# Patient Record
Sex: Female | Born: 1955
Health system: Southern US, Community
[De-identification: ages and names within clinical notes are randomized; demographics above are authoritative.]

## PROBLEM LIST (undated history)

## (undated) DIAGNOSIS — E7800 Pure hypercholesterolemia, unspecified: Secondary | ICD-10-CM

## (undated) DIAGNOSIS — E8801 Alpha-1-antitrypsin deficiency: Secondary | ICD-10-CM

## (undated) DIAGNOSIS — R011 Cardiac murmur, unspecified: Secondary | ICD-10-CM

## (undated) DIAGNOSIS — R911 Solitary pulmonary nodule: Secondary | ICD-10-CM

## (undated) DIAGNOSIS — I10 Essential (primary) hypertension: Secondary | ICD-10-CM

## (undated) DIAGNOSIS — F419 Anxiety disorder, unspecified: Secondary | ICD-10-CM

## (undated) DIAGNOSIS — E559 Vitamin D deficiency, unspecified: Secondary | ICD-10-CM

## (undated) HISTORY — DX: Cardiac murmur, unspecified: R01.1

## (undated) HISTORY — DX: Essential (primary) hypertension: I10

## (undated) HISTORY — DX: Solitary pulmonary nodule: R91.1

## (undated) HISTORY — PX: ABDOMINAL HYSTERECTOMY: SHX81

## (undated) HISTORY — PX: BREAST BIOPSY: SHX20

## (undated) HISTORY — DX: Alpha-1-antitrypsin deficiency: E88.01

## (undated) HISTORY — DX: Vitamin D deficiency, unspecified: E55.9

## (undated) HISTORY — DX: Pure hypercholesterolemia, unspecified: E78.00

## (undated) HISTORY — PX: BREAST EXCISIONAL BIOPSY: SUR124

## (undated) HISTORY — DX: Anxiety disorder, unspecified: F41.9

---

## 2016-07-29 ENCOUNTER — Other Ambulatory Visit: Payer: Self-pay | Admitting: Internal Medicine

## 2016-07-29 DIAGNOSIS — Z1231 Encounter for screening mammogram for malignant neoplasm of breast: Secondary | ICD-10-CM

## 2016-08-13 ENCOUNTER — Other Ambulatory Visit: Payer: Self-pay | Admitting: Internal Medicine

## 2016-08-13 ENCOUNTER — Ambulatory Visit
Admission: RE | Admit: 2016-08-13 | Discharge: 2016-08-13 | Disposition: A | Discharge status: Home / Self Care | Payer: BLUE CROSS/BLUE SHIELD | Source: Ambulatory Visit | Attending: Internal Medicine | Admitting: Internal Medicine

## 2016-08-13 DIAGNOSIS — Z87898 Personal history of other specified conditions: Secondary | ICD-10-CM

## 2016-08-13 DIAGNOSIS — Z1231 Encounter for screening mammogram for malignant neoplasm of breast: Secondary | ICD-10-CM

## 2016-08-16 ENCOUNTER — Ambulatory Visit
Admission: RE | Admit: 2016-08-16 | Discharge: 2016-08-16 | Disposition: A | Discharge status: Home / Self Care | Payer: BLUE CROSS/BLUE SHIELD | Source: Ambulatory Visit | Attending: Internal Medicine | Admitting: Internal Medicine

## 2016-08-16 ENCOUNTER — Other Ambulatory Visit: Payer: Self-pay

## 2016-08-16 DIAGNOSIS — Z87898 Personal history of other specified conditions: Secondary | ICD-10-CM

## 2016-08-16 MED ORDER — IOPAMIDOL (ISOVUE-300) INJECTION 61%
75.0000 mL | Freq: Once | INTRAVENOUS | Status: AC | PRN
  Administered 2016-08-16: 75 mL via INTRAVENOUS

## 2016-10-05 ENCOUNTER — Ambulatory Visit
Admission: RE | Admit: 2016-10-05 | Discharge: 2016-10-05 | Disposition: A | Discharge status: Home / Self Care | Payer: BLUE CROSS/BLUE SHIELD | Source: Ambulatory Visit | Attending: Internal Medicine | Admitting: Internal Medicine

## 2016-10-05 ENCOUNTER — Other Ambulatory Visit: Payer: Self-pay | Admitting: Internal Medicine

## 2016-10-05 DIAGNOSIS — Z0000 Encounter for general adult medical examination without abnormal findings: Secondary | ICD-10-CM

## 2016-10-08 ENCOUNTER — Institutional Professional Consult (permissible substitution): Payer: BLUE CROSS/BLUE SHIELD | Admitting: Internal Medicine

## 2016-11-12 ENCOUNTER — Encounter: Payer: Self-pay | Admitting: Pulmonary Disease

## 2016-11-12 ENCOUNTER — Ambulatory Visit (INDEPENDENT_AMBULATORY_CARE_PROVIDER_SITE_OTHER): Payer: BLUE CROSS/BLUE SHIELD | Admitting: Pulmonary Disease

## 2016-11-12 ENCOUNTER — Telehealth: Payer: Self-pay

## 2016-11-12 VITALS — BP 124/74 | HR 65 | Ht 62.0 in | Wt 130.2 lb

## 2016-11-12 DIAGNOSIS — R0602 Shortness of breath: Secondary | ICD-10-CM | POA: Diagnosis not present

## 2016-11-12 MED ORDER — ALBUTEROL SULFATE HFA 108 (90 BASE) MCG/ACT IN AERS: 2.0000 | INHALATION_SPRAY | Freq: Four times a day (QID) | RESPIRATORY_TRACT | 2 refills | Status: DC | PRN

## 2016-11-12 MED ORDER — UMECLIDINIUM-VILANTEROL 62.5-25 MCG/INH IN AEPB: 1.0000 | INHALATION_SPRAY | Freq: Every day | RESPIRATORY_TRACT | 0 refills | Status: AC

## 2016-11-12 NOTE — Patient Instructions (Signed)
We will start you on anoro and an albuterol rescue inhaler We will schedule you for PFTs and try to obtain old tests from your pulmonologist Return to clinic in 3 months.

## 2016-11-12 NOTE — Telephone Encounter (Signed)
Medical release has faxed to Dr. Cheryl Flash office requesting PFT results and last OV notes. Received successful fax confirmation. Will await records.

## 2016-11-12 NOTE — Progress Notes (Addendum)
Jeanette Barnes    [id number]    1955/09/24  Primary Care Physician:Barnes, Jeanette Challenger, MD  Referring Physician: Kendrick Ranch, MD [address] [place], [place] [address]  Chief complaint: Consult for evaluation of alpha-1 antitrypsin deficiency.   HPI: Jeanette Barnes is a 61 year old with history of alpha-1 antitrypsin deficiency. This was diagnosed in 2014 at [facility] by Dr. Irineo Barnes, Pulmonary with SS phenotype. She is also being followed for subcentimeter pulmonary nodules. Since she is a never smoker just told she does not need a follow-up CT scan. Her father had emphysema but is also heavy smoker. She's had an evaluation at GI in [place] with a normal ultrasound of the liver  In office today she complains of mild dyspnea with activity, nonproductive cough. She has atypical chest pain and has been worked up with a cardiac stress test last year which was normal.  Pets: None Occupation: Secondary school teacher for [employer] Exposures: No known exposures at work or at home Smoking history: No smoking history  Outpatient Encounter Prescriptions as of 11/12/2016  Medication Sig  . ALPRAZolam (XANAX) 0.5 MG tablet   . cholecalciferol (VITAMIN D) 400 units TABS tablet Take 400 Units by mouth.  . dicyclomine (BENTYL) 20 MG tablet   . hydrochlorothiazide (HYDRODIURIL) 25 MG tablet    No facility-administered encounter medications on file as of 11/12/2016.     Allergies as of 11/12/2016  . (Not on File)    Past Medical History:  Diagnosis Date  . AAT (alpha-1-antitrypsin) deficiency (HCC)   . Anxiety   . Hypercholesteremia   . Hypertension   . Pulmonary nodule   . Vitamin D deficiency     Past Surgical History:  Procedure Laterality Date  . ABDOMINAL HYSTERECTOMY    . BREAST BIOPSY    . BREAST EXCISIONAL BIOPSY Bilateral 1990's   2 Left, 1 Right, all benign    Family History  Problem Relation Age of Onset  . Emphysema Father   .  Hypertension Brother     Social History   Social History  . Marital status: Single    Spouse name: N/A  . Number of children: N/A  . Years of education: N/A   Occupational History  . Not on file.   Social History Main Topics  . Smoking status: Never Smoker  . Smokeless tobacco: Never Used  . Alcohol use No  . Drug use: No  . Sexual activity: Not on file   Other Topics Concern  . Not on file   Social History Narrative  . No narrative on file    Review of systems: Review of Systems  Constitutional: Negative for fever and chills.  HENT: Negative.   Eyes: Negative for blurred vision.  Respiratory: as per HPI  Cardiovascular: Negative for chest pain and palpitations.  Gastrointestinal: Negative for vomiting, diarrhea, blood per rectum. Genitourinary: Negative for dysuria, urgency, frequency and hematuria.  Musculoskeletal: Negative for myalgias, back pain and joint pain.  Skin: Negative for itching and rash.  Neurological: Negative for dizziness, tremors, focal weakness, seizures and loss of consciousness.  Endo/Heme/Allergies: Negative for environmental allergies.  Psychiatric/Behavioral: Negative for depression, suicidal ideas and hallucinations.  All other systems reviewed and are negative.  Physical Exam: Blood pressure 124/74, pulse 65, height 5\' 2"  (1.575 m), weight 130 lb 3.2 oz (59.1 kg), SpO2 98 %. Gen:      No acute distress HEENT:  EOMI, sclera anicteric Neck:  No masses; no thyromegaly Lungs:    Clear to auscultation bilaterally; normal respiratory effort CV:         Regular rate and rhythm; no murmurs Abd:      + bowel sounds; soft, non-tender; no palpable masses, no distension Ext:    No edema; adequate peripheral perfusion Skin:      Warm and dry; no rash Neuro: alert and oriented x 3 Psych: normal mood and affect  Data Reviewed: Data from outside [facility] Alpha-1 antitrypsin 09/08/12-SS Phenotype Alpha-1 antitrypsin levels 08/01/15-76.  HRCT  7/1//15-No Emphysema or ILD. Small Fibrotic Streaks. 3 Small Nodules (Left Apex 3 Mm, Posterior Lingular 3 Mm, Right Lower Lobe 5 Mm. HRCT 11/19/14-Subpleural Groundglass Pulmonary Nodule in the Posterior Lingula [3 Mm] and a at Left Lung Base Adjacent to Hemidiaphragm [5 Mm], Medial Left Lower Lobe Scar, Calcified Left Apical Nodule  PFTs 07/05/12- no significant obstructive disease, lung volumes within normal limits, DLCO mildly reduced. FEV 2.91 [104% predicted], ratio of 76.  CT scan at [facility] 08/16/16 4 mm subpleural nodule in the left lower lobe. Mild subpleural nodular thickening in the right middle lobe. I have reviewed the images personally.  Assessment:  Alpha-1 antitrypsin deficiency, SS phenotype. The last A1AT levels are low normal but there was no significant obstruction on PFTs in 2014. We will repeat PFTs to reassess lung function. There is no indication at present for A1AT replacement.   I'll start her on Anoro and an albuterol rescue inhaler for symptoms of dyspnea  Subcentimeter pulmonary nodule These appear benign on my review of CT scan. She has pulmonary nodules dating back to 2015. She is a nonsmoker with no known risk factors and does not need regular follow-up CT scan.  Plan/Recommendations: - Start anoro and albuterol PRN - Order PFTs  Jeanette Greathouse MD [facility] Pager [phone] 11/12/2016, 4:23 PM  CC: Jeanette Barnes, *

## 2017-01-31 ENCOUNTER — Ambulatory Visit: Payer: BLUE CROSS/BLUE SHIELD | Admitting: Pulmonary Disease

## 2017-04-14 ENCOUNTER — Emergency Department (HOSPITAL_BASED_OUTPATIENT_CLINIC_OR_DEPARTMENT_OTHER): Payer: BLUE CROSS/BLUE SHIELD

## 2017-04-14 ENCOUNTER — Encounter (HOSPITAL_BASED_OUTPATIENT_CLINIC_OR_DEPARTMENT_OTHER): Payer: Self-pay | Admitting: Emergency Medicine

## 2017-04-14 ENCOUNTER — Other Ambulatory Visit: Payer: Self-pay

## 2017-04-14 ENCOUNTER — Emergency Department (HOSPITAL_BASED_OUTPATIENT_CLINIC_OR_DEPARTMENT_OTHER)
Admission: EM | Admit: 2017-04-14 | Discharge: 2017-04-14 | Disposition: A | Discharge status: Home / Self Care | Payer: BLUE CROSS/BLUE SHIELD | Attending: Emergency Medicine | Admitting: Emergency Medicine

## 2017-04-14 DIAGNOSIS — R42 Dizziness and giddiness: Secondary | ICD-10-CM | POA: Insufficient documentation

## 2017-04-14 DIAGNOSIS — Z79899 Other long term (current) drug therapy: Secondary | ICD-10-CM | POA: Diagnosis not present

## 2017-04-14 DIAGNOSIS — R079 Chest pain, unspecified: Secondary | ICD-10-CM | POA: Diagnosis present

## 2017-04-14 DIAGNOSIS — R0789 Other chest pain: Secondary | ICD-10-CM | POA: Insufficient documentation

## 2017-04-14 DIAGNOSIS — I10 Essential (primary) hypertension: Secondary | ICD-10-CM | POA: Insufficient documentation

## 2017-04-14 LAB — BASIC METABOLIC PANEL
Anion gap: 9 (ref 5–15)
BUN: 14 mg/dL (ref 6–20)
CALCIUM: 9.1 mg/dL (ref 8.9–10.3)
CO2: 27 mmol/L (ref 22–32)
Chloride: 104 mmol/L (ref 101–111)
Creatinine, Ser: 0.55 mg/dL (ref 0.44–1.00)
GFR calc non Af Amer: >60 mL/min (ref >60)
Glucose, Bld: 99 mg/dL (ref 65–99)
Potassium: 3.5 mmol/L (ref 3.5–5.1)
SODIUM: 140 mmol/L (ref 135–145)

## 2017-04-14 LAB — CBC
HCT: 36.2 % (ref 36.0–46.0)
Hemoglobin: 12.2 g/dL (ref 12.0–15.0)
MCH: 30.3 pg (ref 26.0–34.0)
MCHC: 33.7 g/dL (ref 30.0–36.0)
MCV: 90 fL (ref 78.0–100.0)
Platelets: 210 10*3/uL (ref 150–400)
RBC: 4.02 MIL/uL (ref 3.87–5.11)
RDW: 12.4 % (ref 11.5–15.5)
WBC: 4.6 10*3/uL (ref 4.0–10.5)

## 2017-04-14 LAB — TROPONIN I

## 2017-04-14 LAB — D-DIMER, QUANTITATIVE: D-Dimer, Quant: 0.43 ug/mL-FEU (ref 0.00–0.50)

## 2017-04-14 MED ORDER — SODIUM CHLORIDE 0.9 % IV BOLUS (SEPSIS)
500.0000 mL | Freq: Once | INTRAVENOUS | Status: AC
  Administered 2017-04-14: 500 mL via INTRAVENOUS

## 2017-04-14 NOTE — ED Triage Notes (Signed)
Pt states she has been having intermittent chest pain for two weeks, headache x 2 days, dizziness when standing to transfer to bed.  Pt describes pain as a pressure over her left breast.  Pt states she feels anxious when the pain occurs and feels a little sob.  No diaphoresis, some chills.  Pt states she has been cold a lot lately even with thermostat 72 degrees.

## 2017-04-14 NOTE — Discharge Instructions (Addendum)
You were seen in the emergency department for chest pain. Your blood work and imaging completed in the ED was negative for signs of a pulmonary embolism or heart attack.  Reasons to return to care would be if you have shortness of breath or chest pain that is not resolving, or if you are unable to stay hydrated by mouth.  Please schedule follow up to be seen by your regular doctor within the next 5-7 days.

## 2017-04-14 NOTE — ED Provider Notes (Signed)
[facility] EMERGENCY DEPARTMENT Provider Note   CSN: [id number] Arrival date & time: 04/14/17  0354   History   Chief Complaint Chief Complaint  Patient presents with  . Chest Pain  . Dizziness    HPI Jeanette Barnes is a 62 y.o. female with history of alpha-1-antitrypsin deficiency presents with substernal chest pressure of 8/10 severity that radiates to the left side of her chest. The pain was noted yesterday evening for 1-2 minutes while she was lying on sofa, and then again this morning for 1-2 moments also coming on at rest. She became anxious with the chest pain and felt short of breath but denies diaphoresis, nausea or vomiting. No recent fevers, cough or congestion. No recent wheezing, uses albuterol rarely. She has had previous episodes over last few weeks, but does not feel they have been brought on by activity and she endorses walking her dog daily. She has been feeling general fatigue recently. No recent travel.  Patient does endorse feeling light-headed this morning with chest pain, which she describes as feeling as though she may faint. She has nausea with the light headedness but has not vomited. She does endorse chronic nausea. She additionally is having a headache over her left eye and radiating to her jaw.  Patient reports that she saw a cardiologist back in 2017 had a stress test and cardiac echo which were normal per her report.     Past Medical History:  Diagnosis Date  . AAT (alpha-1-antitrypsin) deficiency (HCC)   . Anxiety   . Hypercholesteremia   . Hypertension   . Pulmonary nodule   . Vitamin D deficiency     There are no active problems to display for this patient.   OB History    No data available       Home Medications    Prior to Admission medications   Medication Sig Start Date End Date Taking? Authorizing Provider  albuterol (PROVENTIL HFA;VENTOLIN HFA) 108 (90 Base) MCG/ACT inhaler Inhale 2 puffs into the lungs every 6 (six)  hours as needed for wheezing or shortness of breath. 11/12/16   Chilton Greathouse, MD  ALPRAZolam Prudy Feeler) 0.5 MG tablet  08/13/16   [provider]  cholecalciferol (VITAMIN D) 400 units TABS tablet Take 400 Units by mouth.    [provider]  dicyclomine (BENTYL) 20 MG tablet  11/11/16   [provider]  hydrochlorothiazide (HYDRODIURIL) 25 MG tablet  10/12/16   [provider]    Family History Family History  Problem Relation Age of Onset  . Emphysema Father   . Hypertension Brother     Social History Social History   Tobacco Use  . Smoking status: Never Smoker  . Smokeless tobacco: Never Used  Substance Use Topics  . Alcohol use: No  . Drug use: No     Allergies   Patient has no known allergies.   Review of Systems Review of Systems  Constitutional: Positive for fatigue. Negative for chills, diaphoresis and fever.  HENT: Negative for congestion, ear pain, rhinorrhea and sneezing.   Respiratory: Negative for cough and wheezing.   Gastrointestinal: Negative for abdominal distention, abdominal pain, constipation, diarrhea and vomiting.  Genitourinary: Negative for dysuria.  Neurological: Positive for light-headedness and headaches. Negative for weakness and numbness.    Physical Exam Updated Vital Signs BP (!) 112/48 (BP Location: Right Arm)   Pulse 89   Temp 98.2 F (36.8 C) (Oral)   Resp 18   Ht 5\' 2"  (1.575  m)   Wt 57.6 kg (127 lb)   SpO2 99%   BMI 23.23 kg/m   Physical Exam  Constitutional: She is oriented to person, place, and time. She appears well-developed and well-nourished.  Non-toxic appearance. No distress.  HENT:  Head: Normocephalic and atraumatic.  Neck: Neck supple.  Cardiovascular: Normal rate, regular rhythm and normal pulses. Exam reveals no gallop and no friction rub.  No murmur heard. Pulmonary/Chest: Effort normal and breath sounds normal. No respiratory distress. She has no wheezes. She has no rhonchi.  She has no rales.  Abdominal: Soft.  Musculoskeletal: Normal range of motion.       Right lower leg: She exhibits no edema.       Left lower leg: She exhibits no edema.  Neurological: She is alert and oriented to person, place, and time.  Skin: Skin is warm and dry.  Psychiatric: She has a normal mood and affect.    ED Treatments / Results  Labs (all labs ordered are listed, but only abnormal results are displayed) Labs Reviewed  TROPONIN I  BASIC METABOLIC PANEL  CBC  TROPONIN I  D-DIMER, QUANTITATIVE (NOT AT [facility])    EKG  EKG Interpretation  Date/Time:  Thursday April 14 2017 09:34:28 EST Ventricular Rate:  67 PR Interval:    QRS Duration: 76 QT Interval:  420 QTC Calculation: 444 R Axis:   22 Text Interpretation:  Sinus rhythm No previous ECGs available Confirmed by Alvira Monday ([id number]) on 04/14/2017 9:47:52 AM       Radiology Dg Chest 2 View  Result Date: 04/14/2017 CLINICAL DATA:  Worsening chest pain since yesterday. EXAM: CHEST  2 VIEW COMPARISON:  CT chest 08/16/2016. FINDINGS: The lungs are clear. Heart size is normal. No pneumothorax or pleural effusion. No bony abnormality. IMPRESSION: Negative chest. Electronically Signed   By: Drusilla Kanner M.D.   On: 04/14/2017 10:42    Procedures Procedures (including critical care time)  Medications Ordered in ED Medications  sodium chloride 0.9 % bolus 500 mL (0 mLs Intravenous Stopped 04/14/17 1111)     Initial Impression / Assessment and Plan / ED Course  I have reviewed the triage vital signs and the nursing notes.  Pertinent labs & imaging results that were available during my care of the patient were reviewed by me and considered in my medical decision making (see chart for details).     62 year old presents with intermittent chest pain and dizziness. ACS rule out with Delta troponin negative and EKG NSR with no ST-T changes.  Heart score 3. CXR negative for acute disease, also no electrolyte  abnormalities on labwork. D-dimer negative to think of acute PE. Overall patient continues to be stable and well-appearing throughout time in ED. Also reassuring that patients chest pain is not worsened with activity. She is considered stable for discharge. Discussed return precautions. Cardiology number provided for follow up.  Final Clinical Impressions(s) / ED Diagnoses   Final diagnoses:  Atypical chest pain   ED Discharge Orders    None       Howard Pouch, MD 04/14/17 1541    Alvira Monday, MD 04/15/17 [phone]

## 2017-04-14 NOTE — ED Notes (Signed)
Lab notified of new orders. 

## 2017-04-19 NOTE — Progress Notes (Deleted)
Cardiology Office Note   Date:  04/19/2017   ID:  Jeanette Barnes, DOB 1955/09/21, MRN [id number]  PCP:  Kendrick Ranch, MD  Cardiologist:   Suzzanne Brunkhorst [place], MD   No chief complaint on file.     History of Present Illness: Jeanette Barnes is a 62 y.o. female who is seen at the request of Dr. Constance Goltz for evaluation of chest pain and dizziness.     Past Medical History:  Diagnosis Date  . AAT (alpha-1-antitrypsin) deficiency (HCC)   . Anxiety   . Hypercholesteremia   . Hypertension   . Pulmonary nodule   . Vitamin D deficiency     *** The histories are not reviewed yet. Please review them in the "History" navigator section and refresh this SmartLink.   Current Outpatient Medications  Medication Sig Dispense Refill  . albuterol (PROVENTIL HFA;VENTOLIN HFA) 108 (90 Base) MCG/ACT inhaler Inhale 2 puffs into the lungs every 6 (six) hours as needed for wheezing or shortness of breath. 1 Inhaler 2  . ALPRAZolam (XANAX) 0.5 MG tablet     . cholecalciferol (VITAMIN D) 400 units TABS tablet Take 400 Units by mouth.    . dicyclomine (BENTYL) 20 MG tablet     . hydrochlorothiazide (HYDRODIURIL) 25 MG tablet      No current facility-administered medications for this visit.     Allergies:   Patient has no known allergies.    Social History:  The patient  reports that  has never smoked. she has never used smokeless tobacco. She reports that she does not drink alcohol or use drugs.   Family History:  The patient's ***family history includes Emphysema in her father; Hypertension in her brother.    ROS:  Please see the history of present illness.   Otherwise, review of systems are positive for {NONE DEFAULTED:18576::"none"}.   All other systems are reviewed and negative.    PHYSICAL EXAM: VS:  There were no vitals taken for this visit. , BMI There is no height or weight on file to calculate BMI. GEN: Well nourished, well developed, in no acute distress  HEENT: normal    Neck: no JVD, carotid bruits, or masses Cardiac: ***RRR; no murmurs, rubs, or gallops,no edema  Respiratory:  clear to auscultation bilaterally, normal work of breathing GI: soft, nontender, nondistended, + BS MS: no deformity or atrophy  Skin: warm and dry, no rash Neuro:  Strength and sensation are intact Psych: euthymic mood, full affect   EKG:  EKG {ACTION; IS/IS MRA:[id number]} ordered today. The ekg ordered today demonstrates ***   Recent Labs: 04/14/2017: BUN 14; Creatinine, Ser 0.55; Hemoglobin 12.2; Platelets 210; Potassium 3.5; Sodium 140    Lipid Panel No results found for: CHOL, TRIG, HDL, CHOLHDL, VLDL, LDLCALC, LDLDIRECT    Wt Readings from Last 3 Encounters:  04/14/17 127 lb (57.6 kg)  11/12/16 130 lb 3.2 oz (59.1 kg)      Other studies Reviewed: Additional studies/ records that were reviewed today include: ***. Review of the above records demonstrates: ***   ASSESSMENT AND PLAN:  1.  ***   Current medicines are reviewed at length with the patient today.  The patient {ACTIONS; HAS/DOES NOT HAVE:19233} concerns regarding medicines.  The following changes have been made:  {PLAN; NO CHANGE:13088:s}  Labs/ tests ordered today include: *** No orders of the defined types were placed in this encounter.    Disposition:   FU with *** in {gen number 3-57:[id number]} {Days to years:10300}  Signed, Theron Arista  [place], MD  04/19/2017 4:46 PM    [facility] [address] [place], [place], [address] Phone [phone], Fax [phone]

## 2017-04-20 ENCOUNTER — Ambulatory Visit: Payer: BLUE CROSS/BLUE SHIELD | Admitting: Cardiology

## 2017-05-06 ENCOUNTER — Ambulatory Visit: Payer: BLUE CROSS/BLUE SHIELD | Admitting: Cardiology

## 2017-05-23 NOTE — Progress Notes (Signed)
Cardiology Office Note   Date:  05/27/2017   ID:  Jeanette Barnes, DOB 09-05-1955, MRN [id number]  PCP:  Kendrick Ranch, MD  Cardiologist:   Peter [place], MD   Chief Complaint  Patient presents with  . Follow-up    NP.  Marland Kitchen Shortness of Breath  . Headache  . Chest Pain      History of Present Illness: Jeanette Barnes is a 62 y.o. female who is seen at the request of Dr. Constance Goltz for evaluation of chest pain. She has a history of HTN and HLD. She also has a history of Alpha 1 antitrypsin deficiency. She was seen in ED here in January with chest pain. Ecg and troponins were normal.   She states she was watching TV and developed a pressure or squeezing in her chest with some SOB. She states she has had these symptoms for years. She was told in her 4s that she had MV prolapse. She reports ED evaluation in 2004 and 2011 for similar symptoms. She has had Echos and nuclear stress tests done as well the most recent in 2017 in [place]. Admits she has anxiety. She does note SOB but no chest pain on exertion.     Past Medical History:  Diagnosis Date  . AAT (alpha-1-antitrypsin) deficiency (HCC)   . Anxiety   . Heart murmur    mitral valve prolapse  . Hypercholesteremia   . Hypertension   . Pulmonary nodule   . Vitamin D deficiency     Past Surgical History:  Procedure Laterality Date  . ABDOMINAL HYSTERECTOMY    . BREAST BIOPSY    . BREAST EXCISIONAL BIOPSY Bilateral 1990's   2 Left, 1 Right, all benign     Current Outpatient Medications  Medication Sig Dispense Refill  . albuterol (PROVENTIL HFA;VENTOLIN HFA) 108 (90 Base) MCG/ACT inhaler Inhale 2 puffs into the lungs every 6 (six) hours as needed for wheezing or shortness of breath. 1 Inhaler 2  . ALPRAZolam (XANAX) 0.5 MG tablet     . cholecalciferol (VITAMIN D) 400 units TABS tablet Take 400 Units by mouth.    . dicyclomine (BENTYL) 20 MG tablet     . estradiol (CLIMARA - DOSED IN MG/24 HR) 0.05 mg/24hr patch  Place 0.05 mg onto the skin once a week.    . hydrochlorothiazide (HYDRODIURIL) 25 MG tablet 12.5 mg.     . VITAMIN E PO Take 1 tablet by mouth as needed.     No current facility-administered medications for this visit.     Allergies:   Patient has no known allergies.    Social History:  The patient  reports that  has never smoked. she has never used smokeless tobacco. She reports that she does not drink alcohol or use drugs.   Family History:  The patient's family history includes Emphysema in her father; Hypertension in her brother; Kidney disease in her brother.    ROS:  Please see the history of present illness.   Otherwise, review of systems are positive for none.   All other systems are reviewed and negative.    PHYSICAL EXAM: VS:  BP 130/82   Pulse 88   Ht 5\' 2"  (1.575 m)   Wt 132 lb (59.9 kg)   BMI 24.14 kg/m  , BMI Body mass index is 24.14 kg/m. GEN: Well nourished, well developed, in no acute distress  HEENT: normal  Neck: no JVD, carotid bruits, or masses Cardiac: RRR; no murmurs, rubs, or gallops,no  edema  Respiratory:  clear to auscultation bilaterally, normal work of breathing GI: soft, nontender, nondistended, + BS MS: no deformity or atrophy  Skin: warm and dry, no rash Neuro:  Strength and sensation are intact Psych: euthymic mood, full affect   EKG:  EKG is not ordered today. The ekg ordered 04/14/17 demonstrates NSR with normal Ecg. I have personally reviewed and interpreted this study.    Recent Labs: 04/14/2017: BUN 14; Creatinine, Ser 0.55; Hemoglobin 12.2; Platelets 210; Potassium 3.5; Sodium 140    Lipid Panel No results found for: CHOL, TRIG, HDL, CHOLHDL, VLDL, LDLCALC, LDLDIRECT    Labs dated 07/29/16: cholesterol 227, triglycerides 149, HDL 51, LDL 146. TSH and ALT normal.  Wt Readings from Last 3 Encounters:  05/27/17 132 lb (59.9 kg)  05/24/17 131 lb (59.4 kg)  04/14/17 127 lb (57.6 kg)       ASSESSMENT AND PLAN:  1.  Atypical  chest pain. Patient has had extensive evaluation in the past for these symptoms with Echo and nuclear stress tests that were reported as normal. I think she is at low risk for CAD. At this point I have recommended she take inhalers as prescribed by pulmonary. If she notes a significant change in her chest pain symptoms with accelerated frequency or severity then we could reevaluate. I would probably recommend a coronary CTA at that point given its negative predictive value. For now will follow up prn.   Current medicines are reviewed at length with the patient today.  The patient does not have concerns regarding medicines.  The following changes have been made:  no change  Labs/ tests ordered today include: none No orders of the defined types were placed in this encounter.    Disposition:   FU PRN  Signed, Peter [place], MD  05/27/2017 10:37 AM    [facility] [address] [place], [place], [address] Phone [phone], Fax [phone]

## 2017-05-24 ENCOUNTER — Ambulatory Visit (INDEPENDENT_AMBULATORY_CARE_PROVIDER_SITE_OTHER): Payer: BLUE CROSS/BLUE SHIELD | Admitting: Pulmonary Disease

## 2017-05-24 ENCOUNTER — Encounter: Payer: Self-pay | Admitting: Pulmonary Disease

## 2017-05-24 ENCOUNTER — Other Ambulatory Visit: Payer: BLUE CROSS/BLUE SHIELD

## 2017-05-24 VITALS — BP 122/64 | HR 82 | Ht 62.0 in | Wt 131.0 lb

## 2017-05-24 DIAGNOSIS — R0602 Shortness of breath: Secondary | ICD-10-CM

## 2017-05-24 DIAGNOSIS — E8801 Alpha-1-antitrypsin deficiency: Secondary | ICD-10-CM

## 2017-05-24 LAB — PULMONARY FUNCTION TEST
DL/VA % PRED: 86 %
DL/VA: 4.11 ml/min/mmHg/L
DLCO COR % PRED: 67 %
DLCO UNC: 15.36 ml/min/mmHg
DLCO cor: 15.99 ml/min/mmHg
DLCO unc % pred: 65 %
FEF 25-75 POST: 1.8 L/s
FEF 25-75 Pre: 1.58 L/sec
FEF2575-%CHANGE-POST: 13 %
FEF2575-%PRED-POST: 95 %
FEF2575-%Pred-Pre: 83 %
FEV1-%CHANGE-POST: 2 %
FEV1-%PRED-POST: 99 %
FEV1-%Pred-Pre: 97 %
FEV1-Post: 1.93 L
FEV1-Pre: 1.88 L
FEV1FVC-%CHANGE-POST: 0 %
FEV1FVC-%PRED-PRE: 97 %
FEV6-%Change-Post: 1 %
FEV6-%PRED-POST: 103 %
FEV6-%Pred-Pre: 102 %
FEV6-Post: 2.48 L
FEV6-Pre: 2.46 L
FEV6FVC-%CHANGE-POST: -1 %
FEV6FVC-%Pred-Post: 102 %
FEV6FVC-%Pred-Pre: 104 %
FVC-%CHANGE-POST: 2 %
FVC-%PRED-PRE: 98 %
FVC-%Pred-Post: 101 %
FVC-PRE: 2.46 L
FVC-Post: 2.52 L
POST FEV1/FVC RATIO: 76 %
Post FEV6/FVC ratio: 99 %
Pre FEV1/FVC ratio: 77 %
Pre FEV6/FVC Ratio: 100 %
RV % pred: 96 %
RV: 1.97 L
TLC % pred: 89 %
TLC: 4.47 L

## 2017-05-24 NOTE — Progress Notes (Signed)
Jeanette Barnes    [id number]    06/24/1955  Primary Care Physician:Schoenhoff, Harrington Challenger, MD  Referring Physician: Kendrick Ranch, MD [address] [place], [place] [address]  Chief complaint: Follow-up for alpha-1 antitrypsin deficiency.   HPI: Jeanette Barnes is a 62 year old with history of alpha-1 antitrypsin deficiency. This was diagnosed in 2014 at [facility] by Jeanette Barnes, Pulmonary with SS phenotype. She is also being followed for subcentimeter pulmonary nodules. Since she is a never smoker just told she does not need a follow-up CT scan. Her father had emphysema but is also heavy smoker. She's had an evaluation at GI in [place] with a normal ultrasound of the liver  In office today she complains of mild dyspnea with activity, nonproductive cough. She has atypical chest pain and has been worked up with a cardiac stress test last year which was normal.  Pets: None Occupation: Secondary school teacher for [employer] Exposures: No known exposures at work or at home Smoking history: No smoking history  Interim history: Continues to have mild dyspnea with activity.  Feels that this may be from anxiety Evaluated at the ED in January for left chest, arm pain with negative EKG, troponin and d-dimer.  She has a follow-up with cardiology next week.  Outpatient Encounter Medications as of 05/24/2017  Medication Sig  . albuterol (PROVENTIL HFA;VENTOLIN HFA) 108 (90 Base) MCG/ACT inhaler Inhale 2 puffs into the lungs every 6 (six) hours as needed for wheezing or shortness of breath.  . ALPRAZolam (XANAX) 0.5 MG tablet   . cholecalciferol (VITAMIN D) 400 units TABS tablet Take 400 Units by mouth.  . dicyclomine (BENTYL) 20 MG tablet   . hydrochlorothiazide (HYDRODIURIL) 25 MG tablet 12.5 mg.    No facility-administered encounter medications on file as of 05/24/2017.     Allergies as of 05/24/2017  . (No Known Allergies)    Past Medical History:  Diagnosis Date    . AAT (alpha-1-antitrypsin) deficiency (HCC)   . Anxiety   . Hypercholesteremia   . Hypertension   . Pulmonary nodule   . Vitamin D deficiency     Past Surgical History:  Procedure Laterality Date  . ABDOMINAL HYSTERECTOMY    . BREAST BIOPSY    . BREAST EXCISIONAL BIOPSY Bilateral 1990's   2 Left, 1 Right, all benign    Family History  Problem Relation Age of Onset  . Emphysema Father   . Hypertension Brother     Social History   Socioeconomic History  . Marital status: Single    Spouse name: Not on file  . Number of children: Not on file  . Years of education: Not on file  . Highest education level: Not on file  Social Needs  . Financial resource strain: Not on file  . Food insecurity - worry: Not on file  . Food insecurity - inability: Not on file  . Transportation needs - medical: Not on file  . Transportation needs - non-medical: Not on file  Occupational History  . Not on file  Tobacco Use  . Smoking status: Never Smoker  . Smokeless tobacco: Never Used  Substance and Sexual Activity  . Alcohol use: No  . Drug use: No  . Sexual activity: Not on file  Other Topics Concern  . Not on file  Social History Narrative  . Not on file    Review of systems: Review of Systems  Constitutional: Negative for fever and chills.  HENT: Negative.   Eyes: Negative for blurred vision.  Respiratory: as per HPI  Cardiovascular: Negative for chest pain and palpitations.  Gastrointestinal: Negative for vomiting, diarrhea, blood per rectum. Genitourinary: Negative for dysuria, urgency, frequency and hematuria.  Musculoskeletal: Negative for myalgias, back pain and joint pain.  Skin: Negative for itching and rash.  Neurological: Negative for dizziness, tremors, focal weakness, seizures and loss of consciousness.  Endo/Heme/Allergies: Negative for environmental allergies.  Psychiatric/Behavioral: Negative for depression, suicidal ideas and hallucinations.  All other  systems reviewed and are negative.  Physical Exam: Blood pressure 122/64, pulse 82, height 5\' 2"  (1.575 m), weight 131 lb (59.4 kg), SpO2 99 %. Gen:      No acute distress HEENT:  EOMI, sclera anicteric Neck:     No masses; no thyromegaly Lungs:    Clear to auscultation bilaterally; normal respiratory effort CV:         Regular rate and rhythm; no murmurs Abd:      + bowel sounds; soft, non-tender; no palpable masses, no distension Ext:    No edema; adequate peripheral perfusion Skin:      Warm and dry; no rash Neuro: alert and oriented x 3 Psych: normal mood and affect  Data Reviewed: Data from outside [facility] Alpha-1 antitrypsin 09/08/12-SS Phenotype Alpha-1 antitrypsin levels 08/01/15-76.  HRCT 7/1//15-No Emphysema or ILD. Small Fibrotic Streaks. 3 Small Nodules (Left Apex 3 Mm, Posterior Lingular 3 Mm, Right Lower Lobe 5 Mm. HRCT 11/19/14-Subpleural Groundglass Pulmonary Nodule in the Posterior Lingula [3 Mm] and a at Left Lung Base Adjacent to Hemidiaphragm [5 Mm], Medial Left Lower Lobe Scar, Calcified Left Apical Nodule  PFTs 07/05/12- no significant obstructive disease, lung volumes within normal limits, DLCO mildly reduced. FEV 2.91 [104% predicted], ratio of 76.  PFTs 05/24/17 FVC 2.52 [101%], FEV1 1.93 [99%], F/F 76, TLC 89%, DLCO 67%, DLCO/VA 86% Minimal obstructive airway disease, reduced diffusion capacity that corrects for alveolar volume.  CT scan at [facility] 08/16/16 4 mm subpleural nodule in the left lower lobe. Mild subpleural nodular thickening in the right middle lobe. Chest x-ray 04/14/17-clear lungs.  No acute abnormality I have reviewed the images personally.  Assessment:  Alpha-1 antitrypsin deficiency, SS phenotype.  PFTs reviewed which showed no overt obstruction however there is a curvature of the flow loop which is just minimal airway obstruction. r  She still has mild dyspnea with exertion.  Given anoro inhaler sample.  If this improves her breathing  then we can give a prescription Continue albuterol as needed. Recheck A1AT levels  Subcentimeter pulmonary nodule These appear benign on my review of CT scan. She has pulmonary nodules dating back to 2015. She is a nonsmoker with no known risk factors and does not need regular follow-up CT scan.  Plan/Recommendations: - Anoro sample, albuterol PRN - Rechek A1AT levels  Chilton Greathouse MD [facility] Pager [phone] 05/24/2017, 1:43 PM  CC: Jeanette Barnes, *

## 2017-05-24 NOTE — Patient Instructions (Signed)
We will repeat alpha-1 antitrypsin levels You have the anoro sample.  Please use this for the next couple of weeks. Call us and let us know if this improves your breathing and we will call in a prescription Continue albuterol as needed I am glad that you are getting a heart checked out.  If cleared by a cardiologist that he can start an exercise regimen at home Follow-up 1 year.

## 2017-05-24 NOTE — Progress Notes (Signed)
PFT done today. 

## 2017-05-25 LAB — ALPHA-1-ANTITRYPSIN: A-1 Antitrypsin, Ser: 88 mg/dL (ref 83–199)

## 2017-05-27 ENCOUNTER — Ambulatory Visit (INDEPENDENT_AMBULATORY_CARE_PROVIDER_SITE_OTHER): Payer: BLUE CROSS/BLUE SHIELD | Admitting: Cardiology

## 2017-05-27 ENCOUNTER — Encounter: Payer: Self-pay | Admitting: Cardiology

## 2017-05-27 DIAGNOSIS — R0789 Other chest pain: Secondary | ICD-10-CM

## 2017-05-27 NOTE — Patient Instructions (Signed)
Continue your current therapy  I encourage you to exercise  Follow up prn

## 2017-06-27 ENCOUNTER — Telehealth: Payer: Self-pay | Admitting: Pulmonary Disease

## 2017-06-27 NOTE — Telephone Encounter (Signed)
Called and spoke with pt letting her know the results of her labwork.  Pt expressed understanding. Nothing further needed at this time. 

## 2017-07-20 ENCOUNTER — Other Ambulatory Visit: Payer: Self-pay | Admitting: Internal Medicine

## 2017-07-20 DIAGNOSIS — Z1231 Encounter for screening mammogram for malignant neoplasm of breast: Secondary | ICD-10-CM

## 2017-08-19 ENCOUNTER — Ambulatory Visit
Admission: RE | Admit: 2017-08-19 | Discharge: 2017-08-19 | Disposition: A | Discharge status: Home / Self Care | Payer: BLUE CROSS/BLUE SHIELD | Source: Ambulatory Visit | Attending: Internal Medicine | Admitting: Internal Medicine

## 2017-08-19 DIAGNOSIS — Z1231 Encounter for screening mammogram for malignant neoplasm of breast: Secondary | ICD-10-CM

## 2018-03-30 ENCOUNTER — Encounter (HOSPITAL_COMMUNITY): Payer: Self-pay

## 2018-03-30 ENCOUNTER — Emergency Department (HOSPITAL_COMMUNITY)
Admission: EM | Admit: 2018-03-30 | Discharge: 2018-03-30 | Disposition: A | Discharge status: Home / Self Care | Payer: Medicare HMO | Attending: Emergency Medicine | Admitting: Emergency Medicine

## 2018-03-30 ENCOUNTER — Emergency Department (HOSPITAL_COMMUNITY): Payer: Medicare HMO

## 2018-03-30 DIAGNOSIS — W01198A Fall on same level from slipping, tripping and stumbling with subsequent striking against other object, initial encounter: Secondary | ICD-10-CM | POA: Diagnosis not present

## 2018-03-30 DIAGNOSIS — Z79899 Other long term (current) drug therapy: Secondary | ICD-10-CM | POA: Diagnosis not present

## 2018-03-30 DIAGNOSIS — S060X0A Concussion without loss of consciousness, initial encounter: Secondary | ICD-10-CM | POA: Diagnosis not present

## 2018-03-30 DIAGNOSIS — S0990XA Unspecified injury of head, initial encounter: Secondary | ICD-10-CM

## 2018-03-30 DIAGNOSIS — Y939 Activity, unspecified: Secondary | ICD-10-CM | POA: Insufficient documentation

## 2018-03-30 DIAGNOSIS — R51 Headache: Secondary | ICD-10-CM | POA: Diagnosis not present

## 2018-03-30 DIAGNOSIS — Y929 Unspecified place or not applicable: Secondary | ICD-10-CM | POA: Insufficient documentation

## 2018-03-30 DIAGNOSIS — Y999 Unspecified external cause status: Secondary | ICD-10-CM | POA: Insufficient documentation

## 2018-03-30 DIAGNOSIS — I10 Essential (primary) hypertension: Secondary | ICD-10-CM | POA: Insufficient documentation

## 2018-03-30 NOTE — ED Notes (Signed)
Patient verbalized understanding of discharge instructions and denies any further needs or questions at this time. VS stable. Patient ambulatory with steady gait.  

## 2018-03-30 NOTE — ED Triage Notes (Signed)
Pt presents for evaluation of headache, photosensitivity and R blurred vision intermittently x 2 weeks after hitting head on fireplace. No LOC at time or N/V/D. Pt reports she feels something is pressing on top of head. No blood thinners.

## 2018-03-30 NOTE — ED Notes (Signed)
Pt endorses bending over and hitting here head on the fireplace 2 weeks ago and since then she has had intermittent headaches and dizziness with blurred vision in both eyes. Ambulatory to room.

## 2018-03-30 NOTE — Discharge Instructions (Signed)
You were examined today for a head injury and possible concussion.  Your head CT showed no evidence of  Injury today.   Sometimes serious problems can develop after a head injury. Please return to the emergency department if you experience any of the following symptoms: Repeated vomiting Headache that gets worse and does not go away Loss of consciousness or inability to stay awake at times when you   normally would be able to Getting more confused, restless or agitated Convulsions or seizures Difficulty walking or feeling off balance Weakness or numbness Vision changes A concussion is a very mild traumatic brain injury caused by a bump, jolt or blow to the head, most people recover quickly and fully. You can experience a wide variety of symptoms including:   - Confusion      - Difficulty concentrating       - Trouble remembering new info  - Headache      - Dizziness        - Fuzzy or blurry vision  - Fatigue      - Balance problems      - Light sensitivity  - Mood swings     - Changes in sleep or difficulty sleeping   To help these symptoms improve make sure you are getting plenty of rest, avoid screen time, loud music and strenuous mental activities. Avoid any strenuous physical activities, once your symptoms have resolved a slow and gradual return to activity is recommended. It is very important that you avoid situations in which you might sustain a second head injury as this can be very dangerous and life threatening. You cannot be medically cleared to return to normal activities until you have followed up with your primary doctor or a concussion specialist for reevaluation.  

## 2018-03-30 NOTE — ED Provider Notes (Signed)
[facility] EMERGENCY DEPARTMENT Provider Note   CSN: [id number] Arrival date & time: 03/30/18  1103     History   Chief Complaint Chief Complaint  Patient presents with  . Head Injury    HPI Jeanette Barnes is a 63 y.o. female.  Jeanette Barnes is a 63 y.o. female with history of hypertension, hyperlipidemia, mitral valve prolapse and alpha-1 antitrypsin deficiency, who presents to the emergency department for evaluation of headache.  She reports that about 2 weeks ago she hit her head on the fireplace when she bent over and stood up quickly striking the back of her head.  She did not have any loss of consciousness at that time but reports since then she has had intermittent headaches that are moderate in severity, not sudden in onset but these tend to come and go frequently.  She also reports that she intermittently has a few seconds of blurred vision, this occurs primarily in her right eye, never last more than a few seconds and then resolve on her own and otherwise she sees normally, she does wear glasses regularly and it has been astigmatism has not had her eyes rechecked.  She is also had some brief episodes of dizziness that lasted a few seconds and then are self resolved.  She denies any associated nausea or vomiting, no confusion, no pre-or postevent amnesia.  She does report some intermittent light sensitivity.  No facial asymmetry, numbness tingling or weakness.  No additional head trauma since she hit her head 2 weeks ago.  No prior history of concussions.  Not on blood thinners.  She has not taken anything for the symptoms prior to arrival.     Past Medical History:  Diagnosis Date  . AAT (alpha-1-antitrypsin) deficiency (HCC)   . Anxiety   . Heart murmur    mitral valve prolapse  . Hypercholesteremia   . Hypertension   . Pulmonary nodule   . Vitamin D deficiency     Patient Active Problem List   Diagnosis Date Noted  . Atypical chest pain 05/27/2017     Past Surgical History:  Procedure Laterality Date  . ABDOMINAL HYSTERECTOMY    . BREAST BIOPSY    . BREAST EXCISIONAL BIOPSY Bilateral 1990's   2 Left, 1 Right, all benign     OB History   No obstetric history on file.      Home Medications    Prior to Admission medications   Medication Sig Start Date End Date Taking? Authorizing Provider  albuterol (PROVENTIL HFA;VENTOLIN HFA) 108 (90 Base) MCG/ACT inhaler Inhale 2 puffs into the lungs every 6 (six) hours as needed for wheezing or shortness of breath. 11/12/16   Chilton Greathouse, MD  ALPRAZolam Prudy Feeler) 0.5 MG tablet  08/13/16   [provider]  cholecalciferol (VITAMIN D) 400 units TABS tablet Take 400 Units by mouth.    [provider]  dicyclomine (BENTYL) 20 MG tablet  11/11/16   [provider]  estradiol (CLIMARA - DOSED IN MG/24 HR) 0.05 mg/24hr patch Place 0.05 mg onto the skin once a week.    [provider]  hydrochlorothiazide (HYDRODIURIL) 25 MG tablet 12.5 mg.  10/12/16   [provider]  VITAMIN E PO Take 1 tablet by mouth as needed.    [provider]    Family History Family History  Problem Relation Age of Onset  . Emphysema Father   . Hypertension Brother   . Kidney disease Brother  Social History Social History   Tobacco Use  . Smoking status: Never Smoker  . Smokeless tobacco: Never Used  Substance Use Topics  . Alcohol use: No  . Drug use: No     Allergies   Patient has no known allergies.   Review of Systems Review of Systems  Constitutional: Negative for chills and fever.  Eyes: Positive for photophobia and visual disturbance. Negative for pain.  Gastrointestinal: Negative for nausea and vomiting.  Musculoskeletal: Negative for neck pain and neck stiffness.  Skin: Negative for color change and rash.  Neurological: Positive for headaches. Negative for dizziness, syncope, facial asymmetry, speech difficulty, weakness,  light-headedness and numbness.     Physical Exam Updated Vital Signs BP 134/75 (BP Location: Right Arm)   Pulse 72   Temp 98 F (36.7 C) (Oral)   Resp 16   SpO2 99%   Physical Exam Vitals signs and nursing note reviewed.  Constitutional:      General: She is not in acute distress.    Appearance: Normal appearance. She is well-developed and normal weight. She is not ill-appearing or diaphoretic.  HENT:     Head: Normocephalic and atraumatic.     Mouth/Throat:     Mouth: Mucous membranes are moist.     Pharynx: Oropharynx is clear.  Eyes:     General:        Right eye: No discharge.        Left eye: No discharge.     Extraocular Movements: Extraocular movements intact.     Pupils: Pupils are equal, round, and reactive to light.     Comments:   Visual Acuity Right Eye Near: R Near: 20/20-2 Left Eye Near:  L Near: 20/20-1 Bilateral Near:  20/16 (this Visual Acuity test was performed w/ glasses at a distance of 6810ft)   Neck:     Musculoskeletal: Normal range of motion and neck supple.  Pulmonary:     Effort: Pulmonary effort is normal. No respiratory distress.  Musculoskeletal:        General: No deformity.  Skin:    General: Skin is warm and dry.     Capillary Refill: Capillary refill takes less than 2 seconds.  Neurological:     Mental Status: She is alert and oriented to person, place, and time. Mental status is at baseline.     Coordination: Coordination normal.     Comments: Speech is clear, able to follow commands CN III-XII intact Normal strength in upper and lower extremities bilaterally including dorsiflexion and plantar flexion, strong and equal grip strength Sensation normal to light and sharp touch Moves extremities without ataxia, coordination intact Normal finger to nose and rapid alternating movements No pronator drift  Psychiatric:        Mood and Affect: Mood normal.        Behavior: Behavior normal.      ED Treatments / Results  Labs (all  labs ordered are listed, but only abnormal results are displayed) Labs Reviewed - No data to display  EKG None  Radiology Ct Head Wo Contrast  Result Date: 03/30/2018 CLINICAL DATA:  Headache with right-sided blurred vision and photosensitivity. Hit head on solid object 2 weeks prior EXAM: CT HEAD WITHOUT CONTRAST TECHNIQUE: Contiguous axial images were obtained from the base of the skull through the vertex without intravenous contrast. COMPARISON:  None. FINDINGS: Brain: The ventricles are normal in size and configuration. There is no intracranial mass, hemorrhage, extra-axial fluid collection, or midline shift. Brain parenchyma  appears unremarkable. No acute infarct evident. Vascular: No hyperdense vessels. There is no appreciable vascular calcification. Skull: Bony calvarium appears intact. Sinuses/Orbits: Visualized paranasal sinuses are clear. Orbits appear symmetric bilaterally. Other: Mastoid air cells are clear. IMPRESSION: Study within normal limits. Electronically Signed   By: Bretta Bang III M.D.   On: 03/30/2018 13:01    Procedures Procedures (including critical care time)  Medications Ordered in ED Medications - No data to display   Initial Impression / Assessment and Plan / ED Course  I have reviewed the triage vital signs and the nursing notes.  Pertinent labs & imaging results that were available during my care of the patient were reviewed by me and considered in my medical decision making (see chart for details).  Patient presents to the emergency department for evaluation of intermittent headaches and blurry vision after head injury 2 weeks ago.  Patient did not have any loss of consciousness is not on any blood thinners and has clear head CT here today reports that since head injury she has had intermittent moderate headaches and some brief episodes of blurred vision and dizziness which resolve on their own after a few seconds.  She has normal neurologic exam here  today and is ambulatory with steady gait.  I have low suspicion for stroke, there is no evidence of hemorrhage on CT.  My suspect symptoms are due to concussive syndrome.  Will have patient follow-up with concussion clinic for continued evaluation of the symptoms.  Provided patient with reassurance.  Return precautions discussed.  Patient expresses understanding and agreement with plan.  Discharged home in good condition.  Final Clinical Impressions(s) / ED Diagnoses   Final diagnoses:  Concussion without loss of consciousness, initial encounter  Injury of head, initial encounter    ED Discharge Orders    None       Legrand Rams 04/03/18 1725    Little, Ambrose Finland, MD 04/04/18 [phone]

## 2018-07-14 ENCOUNTER — Other Ambulatory Visit: Payer: Self-pay | Admitting: Internal Medicine

## 2018-07-14 DIAGNOSIS — Z1231 Encounter for screening mammogram for malignant neoplasm of breast: Secondary | ICD-10-CM

## 2018-09-07 ENCOUNTER — Other Ambulatory Visit: Payer: Self-pay | Admitting: Family Medicine

## 2018-09-07 ENCOUNTER — Ambulatory Visit
Admission: RE | Admit: 2018-09-07 | Discharge: 2018-09-07 | Disposition: A | Discharge status: Home / Self Care | Payer: Medicare HMO | Source: Ambulatory Visit | Attending: Internal Medicine | Admitting: Internal Medicine

## 2018-09-07 DIAGNOSIS — Z1231 Encounter for screening mammogram for malignant neoplasm of breast: Secondary | ICD-10-CM

## 2018-12-13 ENCOUNTER — Other Ambulatory Visit: Payer: Self-pay

## 2018-12-13 DIAGNOSIS — Z20822 Contact with and (suspected) exposure to covid-19: Secondary | ICD-10-CM

## 2018-12-14 LAB — NOVEL CORONAVIRUS, NAA: SARS-CoV-2, NAA: NOT DETECTED

## 2019-03-20 ENCOUNTER — Ambulatory Visit: Payer: Medicare HMO | Attending: Internal Medicine

## 2019-03-20 DIAGNOSIS — Z20822 Contact with and (suspected) exposure to covid-19: Secondary | ICD-10-CM

## 2019-03-22 LAB — NOVEL CORONAVIRUS, NAA: SARS-CoV-2, NAA: NOT DETECTED

## 2019-05-06 ENCOUNTER — Ambulatory Visit: Payer: Medicare HMO | Attending: Internal Medicine

## 2019-05-06 DIAGNOSIS — Z23 Encounter for immunization: Secondary | ICD-10-CM | POA: Insufficient documentation

## 2019-05-06 NOTE — Progress Notes (Signed)
   Covid-19 Vaccination Clinic  Name:  Jeanette Barnes    MRN: [id number] DOB: 06-27-1955  05/06/2019  Jeanette Barnes was observed post Covid-19 immunization for 15 minutes without incidence. She was provided with Vaccine Information Sheet and instruction to access the V-Safe system.   Jeanette Barnes was instructed to call 911 with any severe reactions post vaccine: Marland Kitchen Difficulty breathing  . Swelling of your face and throat  . A fast heartbeat  . A bad rash all over your body  . Dizziness and weakness    Immunizations Administered    Name Date Dose VIS Date Route   Pfizer COVID-19 Vaccine 05/06/2019 11:15 AM 0.3 mL 02/23/2019 Intramuscular   Manufacturer: [employer], [employer]   Lot: [id number]   NDC: [address]-2

## 2019-05-30 ENCOUNTER — Ambulatory Visit: Payer: Medicare HMO | Attending: Internal Medicine

## 2019-05-30 DIAGNOSIS — Z23 Encounter for immunization: Secondary | ICD-10-CM

## 2019-05-30 NOTE — Progress Notes (Signed)
   Covid-19 Vaccination Clinic  Name:  Jeanette Barnes    MRN: [id number] DOB: 12-07-1955  05/30/2019  [name] was observed post Covid-19 immunization for 15 minutes without incident. She was provided with Vaccine Information Sheet and instruction to access the V-Safe system.   [name] was instructed to call 911 with any severe reactions post vaccine: Marland Kitchen Difficulty breathing  . Swelling of face and throat  . A fast heartbeat  . A bad rash all over body  . Dizziness and weakness   Immunizations Administered    Name Date Dose VIS Date Route   Pfizer COVID-19 Vaccine 05/30/2019 10:12 AM 0.3 mL 02/23/2019 Intramuscular   Manufacturer: [employer], [employer]   Lot: [id number]   NDC: [address]-3

## 2019-08-02 ENCOUNTER — Other Ambulatory Visit: Payer: Self-pay | Admitting: Family Medicine

## 2019-08-02 DIAGNOSIS — Z1231 Encounter for screening mammogram for malignant neoplasm of breast: Secondary | ICD-10-CM

## 2019-09-11 ENCOUNTER — Ambulatory Visit: Payer: Medicare HMO

## 2019-09-12 ENCOUNTER — Ambulatory Visit
Admission: RE | Admit: 2019-09-12 | Discharge: 2019-09-12 | Disposition: A | Discharge status: Home / Self Care | Payer: Medicare HMO | Source: Ambulatory Visit | Attending: Family Medicine | Admitting: Family Medicine

## 2019-09-12 ENCOUNTER — Other Ambulatory Visit: Payer: Self-pay

## 2019-09-12 DIAGNOSIS — Z1231 Encounter for screening mammogram for malignant neoplasm of breast: Secondary | ICD-10-CM

## 2019-12-06 ENCOUNTER — Ambulatory Visit: Payer: Medicare HMO | Admitting: Pulmonary Disease

## 2019-12-06 ENCOUNTER — Other Ambulatory Visit: Payer: Self-pay | Admitting: Pulmonary Disease

## 2019-12-06 ENCOUNTER — Other Ambulatory Visit: Payer: Self-pay

## 2019-12-06 ENCOUNTER — Encounter: Payer: Self-pay | Admitting: Pulmonary Disease

## 2019-12-06 ENCOUNTER — Telehealth: Payer: Self-pay | Admitting: Pulmonary Disease

## 2019-12-06 VITALS — BP 126/76 | HR 106 | Temp 99.0°F | Ht 62.0 in | Wt 133.2 lb

## 2019-12-06 DIAGNOSIS — E8801 Alpha-1-antitrypsin deficiency: Secondary | ICD-10-CM | POA: Diagnosis not present

## 2019-12-06 LAB — COMPREHENSIVE METABOLIC PANEL
ALT: 25 U/L (ref 0–35)
AST: 25 U/L (ref 0–37)
Albumin: 4.4 g/dL (ref 3.5–5.2)
Alkaline Phosphatase: 71 U/L (ref 39–117)
BUN: 13 mg/dL (ref 6–23)
CO2: 32 mEq/L (ref 19–32)
Calcium: 9.7 mg/dL (ref 8.4–10.5)
Chloride: 103 mEq/L (ref 96–112)
Creatinine, Ser: 0.78 mg/dL (ref 0.40–1.20)
GFR: 89.23 mL/min (ref >60.00)
Glucose, Bld: 90 mg/dL (ref 70–99)
Potassium: 3.8 mEq/L (ref 3.5–5.1)
Sodium: 141 mEq/L (ref 135–145)
Total Bilirubin: 0.3 mg/dL (ref 0.2–1.2)
Total Protein: 7.2 g/dL (ref 6.0–8.3)

## 2019-12-06 LAB — CBC WITH DIFFERENTIAL/PLATELET
Basophils Absolute: 0 10*3/uL (ref 0.0–0.1)
Basophils Relative: 0.4 % (ref 0.0–3.0)
Eosinophils Absolute: 0.1 10*3/uL (ref 0.0–0.7)
Eosinophils Relative: 1.5 % (ref 0.0–5.0)
HCT: 38.1 % (ref 36.0–46.0)
Hemoglobin: 12.6 g/dL (ref 12.0–15.0)
Lymphocytes Relative: 48.3 % — ABNORMAL HIGH (ref 12.0–46.0)
Lymphs Abs: 2.3 10*3/uL (ref 0.7–4.0)
MCHC: 33.2 g/dL (ref 30.0–36.0)
MCV: 90.7 fl (ref 78.0–100.0)
Monocytes Absolute: 0.5 10*3/uL (ref 0.1–1.0)
Monocytes Relative: 9.5 % (ref 3.0–12.0)
Neutro Abs: 1.9 10*3/uL (ref 1.4–7.7)
Neutrophils Relative %: 40.3 % — ABNORMAL LOW (ref 43.0–77.0)
Platelets: 201 10*3/uL (ref 150.0–400.0)
RBC: 4.2 Mil/uL (ref 3.87–5.11)
RDW: 13.9 % (ref 11.5–15.5)
WBC: 4.8 10*3/uL (ref 4.0–10.5)

## 2019-12-06 MED ORDER — ALBUTEROL SULFATE HFA 108 (90 BASE) MCG/ACT IN AERS: 2.0000 | INHALATION_SPRAY | Freq: Four times a day (QID) | RESPIRATORY_TRACT | 6 refills | Status: DC | PRN

## 2019-12-06 NOTE — Progress Notes (Signed)
Brylinn Teaney    [id number]    [birth year]  Primary Care Physician:Briscoe, Sharrie Rothman, MD  Referring Physician: Kendrick Ranch, MD [address] [place],  [place] [address]  Chief complaint: Follow-up for alpha-1 antitrypsin deficiency.   HPI: Mrs. Backer is a 64 year old with history of alpha-1 antitrypsin deficiency. This was diagnosed in 2014 at [facility] by Dr. Irineo Axon, Pulmonary with SS phenotype. She is also being followed for subcentimeter pulmonary nodules. Since she is a never smoker just told she does not need a follow-up CT scan. Her father had emphysema but is also heavy smoker. She's had an evaluation at GI in [place] with a normal ultrasound of the liver  In office today she complains of mild dyspnea with activity, nonproductive cough. She has atypical chest pain and has been worked up with a cardiac stress test last year which was normal.  Pets: None Occupation: Secondary school teacher for [employer] Exposures: No known exposures at work or at home Smoking history: No smoking history  Interim history: Seen here after gap of 2 years.  Last seen in 2019 She recently had a CT abdomen pelvis by her primary which showed a possible opacity in the lung which was incompletely imaged and has been referred here for further work-up  Overall she says that her breathing has gotten worse with increased dyspnea on exertion.  Outpatient Encounter Medications as of 12/06/2019  Medication Sig  . albuterol (PROVENTIL HFA;VENTOLIN HFA) 108 (90 Base) MCG/ACT inhaler Inhale 2 puffs into the lungs every 6 (six) hours as needed for wheezing or shortness of breath.  . cholecalciferol (VITAMIN D) 400 units TABS tablet Take 400 Units by mouth.  . hydrochlorothiazide (HYDRODIURIL) 25 MG tablet 12.5 mg.   . VITAMIN E PO Take 1 tablet by mouth as needed.  . ALPRAZolam (XANAX) 0.5 MG tablet   . dicyclomine (BENTYL) 20 MG tablet   . estradiol (CLIMARA - DOSED IN MG/24 HR)  0.05 mg/24hr patch Place 0.05 mg onto the skin once a week.   No facility-administered encounter medications on file as of 12/06/2019.   Physical Exam: Blood pressure 126/76, pulse (!) 106, temperature 99 F (37.2 C), temperature source Oral, height 5\' 2"  (1.575 m), weight 133 lb 3.2 oz (60.4 kg), SpO2 99 %. Gen:      No acute distress HEENT:  EOMI, sclera anicteric Neck:     No masses; no thyromegaly Lungs:    Clear to auscultation bilaterally; normal respiratory effort CV:         Regular rate and rhythm; no murmurs Abd:      + bowel sounds; soft, non-tender; no palpable masses, no distension Ext:    No edema; adequate peripheral perfusion Skin:      Warm and dry; no rash Neuro: alert and oriented x 3 Psych: normal mood and affect  Data Reviewed: Imaging: HRCT 7/1//15-No Emphysema or ILD. Small Fibrotic Streaks. 3 Small Nodules (Left Apex 3 Mm, Posterior Lingular 3 Mm, Right Lower Lobe 5 Mm. HRCT 11/19/14-Subpleural Groundglass Pulmonary Nodule in the Posterior Lingula [3 Mm] and a at Left Lung Base Adjacent to Hemidiaphragm [5 Mm], Medial Left Lower Lobe Scar, Calcified Left Apical Nodule  CT scan 08/16/16 4 mm subpleural nodule in the left lower lobe. Mild subpleural nodular thickening in the right middle lobe. Chest x-ray 04/14/17-clear lungs.  No acute abnormality I have reviewed the images personally.  CT abdomen pelvis 07/26/2019 ([facility]) The first image on  series 3 lung windows reveals a potential medial right lower lobe opacity however this could be a partially imaged normal structure. Lung bases are otherwise clear. Heart size is normal.    PFTs:  07/05/12- no significant obstructive disease, lung volumes within normal limits, DLCO mildly reduced. FEV 2.91 [104% predicted], ratio of 76.  05/24/17 FVC 2.52 [101%], FEV1 1.93 [99%], F/F 76, TLC 89%, DLCO 67%, DLCO/VA 86% Minimal obstructive airway disease, reduced diffusion capacity that corrects for alveolar  volume.  Labs: Data from outside [facility] Alpha-1 antitrypsin 09/08/12-SS Phenotype Alpha-1 antitrypsin levels 08/01/15-76. Alpha-1 antitrypsin levels 05/24/2017-88  Assessment:  Alpha-1 antitrypsin deficiency, SS phenotype.  PFTs reviewed which showed no overt obstruction however there is a curvature of the flow loop which is just minimal airway obstruction.   Previously on Anoro inhaler.  Now on just albuterol as needed Given worsening symptoms we will recheck alpha-1 antitrypsin levels and PFTs  Abnormal imaging Recent CT abdomen pelvis shows possible opacity in the lung.  She has previously followed for benign lung nodules We will get CT chest without contrast for further evaluation  Plan/Recommendations: - Albuterol PRN - Rechek A1AT levels, PFTs - CT chest without contrast  Chilton Greathouse MD [facility] 12/06/2019, 1:59 PM  CC: Kendrick Ranch, *

## 2019-12-06 NOTE — Telephone Encounter (Signed)
I had called pt.  Called her back & gave her appt info.  Nothing further needed.

## 2019-12-06 NOTE — Patient Instructions (Signed)
We will need to get CT chest without contrast for reevaluation of your lungs since your CT abdomen at [facility] showed potential abnormality We will check labs today including CBC with differential, CMP, alpha-1 antitrypsin levels and IgE Schedule PFTs  Follow-up in 2 to 3 months.

## 2019-12-07 ENCOUNTER — Encounter: Payer: Self-pay | Admitting: Cardiology

## 2019-12-07 ENCOUNTER — Telehealth: Payer: Self-pay | Admitting: Cardiology

## 2019-12-07 ENCOUNTER — Ambulatory Visit: Payer: Medicare HMO | Admitting: Cardiology

## 2019-12-07 VITALS — BP 178/102 | HR 63 | Ht 62.0 in | Wt 133.6 lb

## 2019-12-07 DIAGNOSIS — I10 Essential (primary) hypertension: Secondary | ICD-10-CM

## 2019-12-07 DIAGNOSIS — R072 Precordial pain: Secondary | ICD-10-CM

## 2019-12-07 DIAGNOSIS — R079 Chest pain, unspecified: Secondary | ICD-10-CM | POA: Diagnosis not present

## 2019-12-07 DIAGNOSIS — E7800 Pure hypercholesterolemia, unspecified: Secondary | ICD-10-CM

## 2019-12-07 DIAGNOSIS — E8801 Alpha-1-antitrypsin deficiency: Secondary | ICD-10-CM

## 2019-12-07 DIAGNOSIS — R0609 Other forms of dyspnea: Secondary | ICD-10-CM

## 2019-12-07 DIAGNOSIS — R0600 Dyspnea, unspecified: Secondary | ICD-10-CM | POA: Diagnosis not present

## 2019-12-07 MED ORDER — METOPROLOL TARTRATE 100 MG PO TABS
ORAL_TABLET | ORAL | 0 refills | Status: DC
Start: 2019-12-07 — End: 2020-02-05

## 2019-12-07 NOTE — Telephone Encounter (Signed)
Patient last seen in 2019. Was able to get patient scheduled for this afternoon at 1:20 pm.

## 2019-12-07 NOTE — Patient Instructions (Addendum)
Medication Instructions:  Continue same medications   Lab Work: None ordered   Testing/Procedures: Coronary CT will be scheduled after insurance approves    Follow instructions below   Follow-Up: At [facility], you and your health needs are our priority.  As part of our continuing mission to provide you with exceptional heart care, we have created designated Provider Care Teams.  These Care Teams include your primary Cardiologist (physician) and Advanced Practice Providers (APPs -  Physician Assistants and Nurse Practitioners) who all work together to provide you with the care you need, when you need it.  We recommend signing up for the patient portal called "MyChart".  Sign up information is provided on this After Visit Summary.  MyChart is used to connect with patients for Virtual Visits (Telemedicine).  Patients are able to view lab/test results, encounter notes, upcoming appointments, etc.  Non-urgent messages can be sent to your provider as well.   To learn more about what you can do with MyChart, go to [email or web address].    Your next appointment:  Friday 01/25/20 at 9:20 am   The format for your next appointment: Office    Provider:  Dr.Jordan       Your cardiac CT will be scheduled at one of the below locations:   [facility] [address] [place], [place] [address] [phone]  [place] [address] [place], [place] [address] [phone]  If scheduled at [facility], please arrive at the [facility] main entrance of [facility] 30 minutes prior to test start time. Proceed to the [facility] Radiology Department (first floor) to check-in and test prep.  If scheduled at [facility], please arrive 15 mins early for check-in and test prep.  Please follow these instructions carefully (unless otherwise directed):    On the Night Before the  Test: . Be sure to Drink plenty of water. . Do not consume any caffeinated/decaffeinated beverages or chocolate 12 hours prior to your test. . Do not take any antihistamines 12 hours prior to your test.    On the Day of the Test: . Drink plenty of water. Do not drink any water within one hour of the test. . Do not eat any food 4 hours prior to the test. . You may take your regular medications prior to the test.  . Take metoprolol 100 mg two hours prior to test. . HOLD Hydrochlorothiazide morning of the test. . FEMALES- please wear underwire-free bra if available         After the Test: . Drink plenty of water. . After receiving IV contrast, you may experience a mild flushed feeling. This is normal. . On occasion, you may experience a mild rash up to 24 hours after the test. This is not dangerous. If this occurs, you can take Benadryl 25 mg and increase your fluid intake. . If you experience trouble breathing, this can be serious. If it is severe call 911 IMMEDIATELY. If it is mild, please call our office.    Once we have confirmed authorization from your insurance company, we will call you to set up a date and time for your test. Based on how quickly your insurance processes prior authorizations requests, please allow up to 4 weeks to be contacted for scheduling your Cardiac CT appointment. Be advised that routine Cardiac CT appointments could be scheduled as many as 8 weeks after your provider has ordered it.  For non-scheduling related questions, please contact the cardiac imaging nurse navigator should you have any questions/concerns: Marchia Bond, Cardiac Imaging Nurse Navigator Burley Saver, Interim Cardiac Imaging Nurse [place] [facility] Direct Office Dial: [phone]   For scheduling needs, including cancellations and rescheduling, please call Vivien Rota at [phone], option 3.

## 2019-12-07 NOTE — Progress Notes (Signed)
Cardiology Office Note   Date:  12/07/2019   ID:  Jeanette Barnes, DOB Jul 21, 1955, MRN [id number]  PCP:  Macy Mis, MD  Cardiologist:   Alohilani Levenhagen [place], MD   No chief complaint on file.     History of Present Illness: Jeanette Barnes is a 64 y.o. female who is seen for evaluation of chest pain and dyspnea. She has a history of HTN and HLD. She also has a history of Alpha 1 antitrypsin deficiency. She was last seen in 2019 for atypical chest pain. She states she  had these symptoms for years. She was told in her 52s that she had MV prolapse. She reports ED evaluation in 2004 and 2011 for similar symptoms. She  had Echos and nuclear stress tests done in the past in [place]- last in 2017.   She was seen by Pulmonary yesterday with symptoms of SOB. Repeat PFTs and Alpha 1 antitrypsin levels ordered. HR 106 at that visit. BP was normal. She also reports symptoms of chest pain with exertion especially walking her dog uphill. She notes BP generally is good but is elevated today. She does have a dry cough.     Past Medical History:  Diagnosis Date  . AAT (alpha-1-antitrypsin) deficiency (HCC)   . Anxiety   . Heart murmur    mitral valve prolapse  . Hypercholesteremia   . Hypertension   . Pulmonary nodule   . Vitamin D deficiency     Past Surgical History:  Procedure Laterality Date  . ABDOMINAL HYSTERECTOMY    . BREAST BIOPSY    . BREAST EXCISIONAL BIOPSY Bilateral 1990's   2 Left, 1 Right, all benign     Current Outpatient Medications  Medication Sig Dispense Refill  . albuterol (VENTOLIN HFA) 108 (90 Base) MCG/ACT inhaler Inhale 2 puffs into the lungs every 6 (six) hours as needed for wheezing or shortness of breath. 18 g 6  . cholecalciferol (VITAMIN D) 400 units TABS tablet Take 400 Units by mouth.    . hydrochlorothiazide (HYDRODIURIL) 25 MG tablet 12.5 mg.     . VITAMIN E PO Take 1 tablet by mouth as needed.    . metoprolol tartrate (LOPRESSOR) 100 MG tablet Take  100 mg 2 hours before Coronary CT 1 tablet 0   No current facility-administered medications for this visit.    Allergies:   Patient has no known allergies.    Social History:  The patient  reports that she has never smoked. She has never used smokeless tobacco. She reports that she does not drink alcohol and does not use drugs.   Family History:  The patient's family history includes Emphysema in her father; Hypertension in her brother; Kidney disease in her brother.    ROS:  Please see the history of present illness.   Otherwise, review of systems are positive for none.   All other systems are reviewed and negative.    PHYSICAL EXAM: VS:  BP (!) 178/102   Pulse 63   Ht 5\' 2"  (1.575 m)   Wt 133 lb 9.6 oz (60.6 kg)   SpO2 99%   BMI 24.44 kg/m  , BMI Body mass index is 24.44 kg/m. GEN: Well nourished, well developed, in no acute distress  HEENT: normal  Neck: no JVD, carotid bruits, or masses Cardiac: RRR; no murmurs, rubs, or gallops,no edema  Respiratory:  clear to auscultation bilaterally, normal work of breathing GI: soft, nontender, nondistended, + BS MS: no deformity or atrophy  Skin:  warm and dry, no rash Neuro:  Strength and sensation are intact Psych: euthymic mood, full affect   EKG:  EKG is ordered today. The ekg ordered today demonstrates NSR with normal Ecg. I have personally reviewed and interpreted this study.    Recent Labs: 12/06/2019: ALT 25; BUN 13; Creatinine, Ser 0.78; Hemoglobin 12.6; Platelets 201.0; Potassium 3.8; Sodium 141    Lipid Panel No results found for: CHOL, TRIG, HDL, CHOLHDL, VLDL, LDLCALC, LDLDIRECT    Labs dated 10/31/19: cholesterol 244, triglycerides 106, HDL 60, LDL 165.   Wt Readings from Last 3 Encounters:  12/07/19 133 lb 9.6 oz (60.6 kg)  12/06/19 133 lb 3.2 oz (60.4 kg)  05/27/17 132 lb (59.9 kg)       ASSESSMENT AND PLAN:  1.  Chest pain and dyspnea on exertion. Cardiac risk factors include HLD and HTN. I think it  would be nice to do a coronary CTA on her for more definitive evaluation. This would also inform us on how aggressive to treat her lipids.  2. HTN. Generally BP has been good. Elevated today. Will monitor. 3. HLD LDL 165. Based on above study will consider lipid lowering therapy   Current medicines are reviewed at length with the patient today.  The patient does not have concerns regarding medicines.  The following changes have been made:  no change  Labs/ tests ordered today include: none  Orders Placed This Encounter  Procedures  . CT CORONARY MORPH W/CTA COR W/SCORE W/CA W/CM &/OR WO/CM  . CT CORONARY FRACTIONAL FLOW RESERVE DATA PREP  . CT CORONARY FRACTIONAL FLOW RESERVE FLUID ANALYSIS  . EKG 12-Lead     Disposition:   FU after above studies  Signed, Anusha Claus Sw[place], MD  12/07/2019 2:49 PM    [facility] [address], [place], [place], [address] Phone [phone], Fax [phone]

## 2019-12-07 NOTE — Addendum Note (Signed)
Addended by: Neoma Laming on: 12/07/2019 03:03 PM   Modules accepted: Orders

## 2019-12-18 ENCOUNTER — Other Ambulatory Visit: Payer: Medicare HMO

## 2019-12-18 LAB — IGE: IgE (Immunoglobulin E), Serum: 4 kU/L (ref <=114)

## 2019-12-18 LAB — ALPHA-1 ANTITRYPSIN PHENOTYPE: A-1 Antitrypsin, Ser: 93 mg/dL (ref 83–199)

## 2019-12-24 ENCOUNTER — Telehealth (HOSPITAL_COMMUNITY): Payer: Self-pay | Admitting: Emergency Medicine

## 2019-12-24 NOTE — Telephone Encounter (Signed)
Reaching out to patient to offer assistance regarding upcoming cardiac imaging study; pt verbalizes understanding of appt date/time, parking situation and where to check in, pre-test NPO status and medications ordered, and verified current allergies; name and call back number provided for further questions should they arise Rockwell Alexandria RN Navigator Cardiac Imaging [facility] [phone] office [phone] cell   Pt holding HCTZ the day of scan. Taking metoprolol 2 hr prior to scan.  Pt also scheduled for CT chest w/o contrast prior to coronary morph CTA [place] Dec

## 2019-12-26 ENCOUNTER — Ambulatory Visit (HOSPITAL_COMMUNITY)
Admission: RE | Admit: 2019-12-26 | Discharge: 2019-12-26 | Disposition: A | Discharge status: Home / Self Care | Payer: Medicare HMO | Source: Ambulatory Visit | Attending: Cardiology | Admitting: Cardiology

## 2019-12-26 ENCOUNTER — Ambulatory Visit (HOSPITAL_COMMUNITY)
Admission: RE | Admit: 2019-12-26 | Discharge: 2019-12-26 | Disposition: A | Discharge status: Home / Self Care | Payer: Medicare HMO | Source: Ambulatory Visit | Attending: Pulmonary Disease | Admitting: Pulmonary Disease

## 2019-12-26 ENCOUNTER — Other Ambulatory Visit: Payer: Self-pay

## 2019-12-26 DIAGNOSIS — R072 Precordial pain: Secondary | ICD-10-CM

## 2019-12-26 DIAGNOSIS — E8801 Alpha-1-antitrypsin deficiency: Secondary | ICD-10-CM | POA: Diagnosis present

## 2019-12-26 MED ORDER — NITROGLYCERIN 0.4 MG SL SUBL: 0.8000 mg | SUBLINGUAL_TABLET | Freq: Once | SUBLINGUAL | Status: AC

## 2019-12-26 MED ORDER — NITROGLYCERIN 0.4 MG SL SUBL
SUBLINGUAL_TABLET | SUBLINGUAL | Status: AC
  Administered 2019-12-26: 0.8 mg via SUBLINGUAL
  Filled 2019-12-26: qty 2

## 2019-12-26 MED ORDER — IOHEXOL 350 MG/ML SOLN
80.0000 mL | Freq: Once | INTRAVENOUS | Status: AC | PRN
  Administered 2019-12-26: 80 mL via INTRAVENOUS

## 2020-01-02 ENCOUNTER — Telehealth: Payer: Self-pay | Admitting: Cardiology

## 2020-01-02 NOTE — Telephone Encounter (Signed)
Called patient left message on personal voice mail I will ask Dr.Jordan tomorrow if you need to keep appointment 11/12.

## 2020-01-02 NOTE — Telephone Encounter (Signed)
New Message:    Pt said her CT results were good. Her question does she still need to keep her appt on 02-03-20, since her test results were good?

## 2020-01-04 NOTE — Telephone Encounter (Signed)
Spoke to patient 01/03/20 Dr.Jordan advised to follow up with him as needed.

## 2020-01-25 ENCOUNTER — Ambulatory Visit: Payer: Medicare HMO | Admitting: Cardiology

## 2020-02-05 ENCOUNTER — Other Ambulatory Visit: Payer: Self-pay

## 2020-02-05 ENCOUNTER — Encounter: Payer: Self-pay | Admitting: Pulmonary Disease

## 2020-02-05 ENCOUNTER — Ambulatory Visit: Payer: Medicare HMO | Admitting: Pulmonary Disease

## 2020-02-05 ENCOUNTER — Ambulatory Visit (INDEPENDENT_AMBULATORY_CARE_PROVIDER_SITE_OTHER): Payer: Medicare HMO | Admitting: Pulmonary Disease

## 2020-02-05 ENCOUNTER — Ambulatory Visit: Payer: Medicare HMO

## 2020-02-05 VITALS — BP 136/78 | HR 69 | Temp 98.6°F | Ht 63.5 in | Wt 136.6 lb

## 2020-02-05 DIAGNOSIS — E8801 Alpha-1-antitrypsin deficiency: Secondary | ICD-10-CM

## 2020-02-05 LAB — PULMONARY FUNCTION TEST
DL/VA % pred: 96 %
DL/VA: 4.03 ml/min/mmHg/L
DLCO cor % pred: 81 %
DLCO cor: 15.75 ml/min/mmHg
DLCO unc % pred: 79 %
DLCO unc: 15.35 ml/min/mmHg
FEF 25-75 Post: 1.8 L/sec
FEF 25-75 Pre: 1.42 L/sec
FEF2575-%Change-Post: 27 %
FEF2575-%Pred-Post: 99 %
FEF2575-%Pred-Pre: 78 %
FEV1-%Change-Post: 6 %
FEV1-%Pred-Post: 97 %
FEV1-%Pred-Pre: 92 %
FEV1-Post: 1.85 L
FEV1-Pre: 1.75 L
FEV1FVC-%Change-Post: 1 %
FEV1FVC-%Pred-Pre: 96 %
FEV6-%Change-Post: 6 %
FEV6-%Pred-Post: 103 %
FEV6-%Pred-Pre: 97 %
FEV6-Post: 2.42 L
FEV6-Pre: 2.28 L
FEV6FVC-%Change-Post: 1 %
FEV6FVC-%Pred-Post: 103 %
FEV6FVC-%Pred-Pre: 102 %
FVC-%Change-Post: 4 %
FVC-%Pred-Post: 99 %
FVC-%Pred-Pre: 95 %
FVC-Post: 2.43 L
FVC-Pre: 2.33 L
Post FEV1/FVC ratio: 76 %
Post FEV6/FVC ratio: 100 %
Pre FEV1/FVC ratio: 75 %
Pre FEV6/FVC Ratio: 99 %
RV % pred: 92 %
RV: 1.92 L
TLC % pred: 87 %
TLC: 4.35 L

## 2020-02-05 NOTE — Progress Notes (Signed)
PFT done today. 

## 2020-02-05 NOTE — Patient Instructions (Signed)
We will get PFTs in 2 years and follow-up in clinic in 2 years

## 2020-02-05 NOTE — Progress Notes (Signed)
Jeanette Barnes    [id number]    April 16, 1955  Primary Care Physician:Briscoe, Sharrie Rothman, MD  Referring Physician: Macy Mis, MD [address] [place],  [place] [address]  Chief complaint: Follow-up for alpha-1 antitrypsin deficiency.   HPI: Mrs. Jeanette Barnes is a 64 year old with history of alpha-1 antitrypsin deficiency. This was diagnosed in 2014 at [facility] by Dr. Irineo Axon, Pulmonary with SS phenotype. She is also being followed for subcentimeter pulmonary nodules. Since she is a never smoker just told she does not need a follow-up CT scan. Her father had emphysema but is also heavy smoker. She's had an evaluation at GI in [place] with a normal ultrasound of the liver  In office today she complains of mild dyspnea with activity, nonproductive cough. She has atypical chest pain and has been worked up with a cardiac stress test last year which was normal.  Pets: None Occupation: Secondary school teacher for [employer] Exposures: No known exposures at work or at home Smoking history: No smoking history  Interim history: Seen here after gap of 2 years.  Last seen in 2019 She recently had a CT abdomen pelvis by her primary which showed a possible opacity in the lung which was incompletely imaged and has been referred here for further work-up  Overall she says that her breathing has gotten worse with increased dyspnea on exertion.  Outpatient Encounter Medications as of 02/05/2020  Medication Sig  . albuterol (VENTOLIN HFA) 108 (90 Base) MCG/ACT inhaler Inhale 2 puffs into the lungs every 6 (six) hours as needed for wheezing or shortness of breath.  . cholecalciferol (VITAMIN D) 400 units TABS tablet Take 400 Units by mouth.  . hydrochlorothiazide (HYDRODIURIL) 25 MG tablet 12.5 mg.   . VITAMIN E PO Take 1 tablet by mouth as needed.  . [DISCONTINUED] metoprolol tartrate (LOPRESSOR) 100 MG tablet Take 100 mg 2 hours before Coronary CT   No facility-administered  encounter medications on file as of 02/05/2020.   Physical Exam: Blood pressure 126/76, pulse (!) 106, temperature 99 F (37.2 C), temperature source Oral, height 5\' 2"  (1.575 m), weight 133 lb 3.2 oz (60.4 kg), SpO2 99 %. Gen:      No acute distress HEENT:  EOMI, sclera anicteric Neck:     No masses; no thyromegaly Lungs:    Clear to auscultation bilaterally; normal respiratory effort CV:         Regular rate and rhythm; no murmurs Abd:      + bowel sounds; soft, non-tender; no palpable masses, no distension Ext:    No edema; adequate peripheral perfusion Skin:      Warm and dry; no rash Neuro: alert and oriented x 3 Psych: normal mood and affect  Data Reviewed: Imaging: HRCT 7/1//15-No Emphysema or ILD. Small Fibrotic Streaks. 3 Small Nodules (Left Apex 3 Mm, Posterior Lingular 3 Mm, Right Lower Lobe 5 Mm. HRCT 11/19/14-Subpleural Groundglass Pulmonary Nodule in the Posterior Lingula [3 Mm] and a at Left Lung Base Adjacent to Hemidiaphragm [5 Mm], Medial Left Lower Lobe Scar, Calcified Left Apical Nodule  CT scan 08/16/16 4 mm subpleural nodule in the left lower lobe. Mild subpleural nodular thickening in the right middle lobe. Chest x-ray 04/14/17-clear lungs.  No acute abnormality I have reviewed the images personally.  CT abdomen pelvis 07/26/2019 ([facility]) The first image on series 3 lung windows reveals a potential medial right lower lobe opacity however this could be a partially imaged normal structure.  Lung bases are otherwise clear. Heart size is normal.    CT chest 12/26/2019-no emphysema, stable 4 mm nodule in the periphery of the left lower lobe. I have reviewed the images personally.  PFTs:  07/05/12- no significant obstructive disease, lung volumes within normal limits, DLCO mildly reduced. FEV 2.91 [104% predicted], ratio of 76.  05/24/17 FVC 2.52 [101%], FEV1 1.93 [99%], F/F 76, TLC 89%, DLCO 67%, DLCO/VA 86%  02/05/2020 FVC 2.43 [9 9%), FEV1 1.85 [97%], F/F 76, TLC  4.35 [87%], DLCOcorr 15.35 [81%] No obstruction, lung volumes and diffusion capacity are normal  Labs: Data from outside [facility] Alpha-1 antitrypsin 09/08/12-SS Phenotype Alpha-1 antitrypsin levels 08/01/15-76. Alpha-1 antitrypsin levels 05/24/2017-88  Alpha-1 antitrypsin 12/06/2019-93, SS phenotype  Hepatic panel 12/06/2019-within normal limits  Assessment:  Alpha-1 antitrypsin deficiency, SS phenotype. Alpha-1 antitrypsin levels and lung function test has remained stable with no evidence of COPD Continue albuterol as needed Follow-up in 2 years  Recent hepatic panel is normal  Abnormal imaging Recent CT abdomen pelvis shows possible opacity in the lung.  She has previously followed for benign lung nodules  However there is no abnormality on CT.  She has small lung nodules stable since 2018 and likely benign.  No further follow-up needed.  Plan/Recommendations: - Albuterol PRN - Follow-up in 2 years with PFTs  Chilton Greathouse MD [facility] 02/05/2020, 10:45 AM  CC: Macy Mis, MD

## 2020-03-15 LAB — COLOGUARD: Cologuard: NEGATIVE

## 2020-05-18 ENCOUNTER — Emergency Department (HOSPITAL_COMMUNITY): Payer: Medicare HMO

## 2020-05-18 ENCOUNTER — Other Ambulatory Visit: Payer: Self-pay

## 2020-05-18 ENCOUNTER — Inpatient Hospital Stay (HOSPITAL_COMMUNITY)
Admission: EM | Admit: 2020-05-18 | Discharge: 2020-05-23 | DRG: 064 | Disposition: A | Discharge status: Rehabilitation Facility | Payer: Medicare HMO | Attending: Student | Admitting: Student

## 2020-05-18 DIAGNOSIS — D72829 Elevated white blood cell count, unspecified: Secondary | ICD-10-CM

## 2020-05-18 DIAGNOSIS — G936 Cerebral edema: Secondary | ICD-10-CM | POA: Diagnosis present

## 2020-05-18 DIAGNOSIS — D62 Acute posthemorrhagic anemia: Secondary | ICD-10-CM

## 2020-05-18 DIAGNOSIS — E7800 Pure hypercholesterolemia, unspecified: Secondary | ICD-10-CM | POA: Diagnosis present

## 2020-05-18 DIAGNOSIS — I10 Essential (primary) hypertension: Secondary | ICD-10-CM

## 2020-05-18 DIAGNOSIS — R4701 Aphasia: Secondary | ICD-10-CM | POA: Diagnosis present

## 2020-05-18 DIAGNOSIS — Z841 Family history of disorders of kidney and ureter: Secondary | ICD-10-CM

## 2020-05-18 DIAGNOSIS — Z8249 Family history of ischemic heart disease and other diseases of the circulatory system: Secondary | ICD-10-CM

## 2020-05-18 DIAGNOSIS — R011 Cardiac murmur, unspecified: Secondary | ICD-10-CM | POA: Diagnosis present

## 2020-05-18 DIAGNOSIS — I341 Nonrheumatic mitral (valve) prolapse: Secondary | ICD-10-CM | POA: Diagnosis present

## 2020-05-18 DIAGNOSIS — I639 Cerebral infarction, unspecified: Secondary | ICD-10-CM

## 2020-05-18 DIAGNOSIS — E559 Vitamin D deficiency, unspecified: Secondary | ICD-10-CM | POA: Diagnosis present

## 2020-05-18 DIAGNOSIS — H5500 Unspecified nystagmus: Secondary | ICD-10-CM | POA: Diagnosis present

## 2020-05-18 DIAGNOSIS — R29705 NIHSS score 5: Secondary | ICD-10-CM | POA: Diagnosis present

## 2020-05-18 DIAGNOSIS — Z79899 Other long term (current) drug therapy: Secondary | ICD-10-CM

## 2020-05-18 DIAGNOSIS — F419 Anxiety disorder, unspecified: Secondary | ICD-10-CM | POA: Diagnosis present

## 2020-05-18 DIAGNOSIS — I5032 Chronic diastolic (congestive) heart failure: Secondary | ICD-10-CM | POA: Diagnosis present

## 2020-05-18 DIAGNOSIS — Z825 Family history of asthma and other chronic lower respiratory diseases: Secondary | ICD-10-CM

## 2020-05-18 DIAGNOSIS — R42 Dizziness and giddiness: Secondary | ICD-10-CM | POA: Diagnosis not present

## 2020-05-18 DIAGNOSIS — R270 Ataxia, unspecified: Secondary | ICD-10-CM | POA: Diagnosis present

## 2020-05-18 DIAGNOSIS — E876 Hypokalemia: Secondary | ICD-10-CM

## 2020-05-18 DIAGNOSIS — R4789 Other speech disturbances: Secondary | ICD-10-CM | POA: Diagnosis present

## 2020-05-18 DIAGNOSIS — I6329 Cerebral infarction due to unspecified occlusion or stenosis of other precerebral arteries: Secondary | ICD-10-CM | POA: Diagnosis present

## 2020-05-18 DIAGNOSIS — E8801 Alpha-1-antitrypsin deficiency: Secondary | ICD-10-CM | POA: Diagnosis present

## 2020-05-18 DIAGNOSIS — W19XXXA Unspecified fall, initial encounter: Secondary | ICD-10-CM | POA: Diagnosis present

## 2020-05-18 DIAGNOSIS — E785 Hyperlipidemia, unspecified: Secondary | ICD-10-CM

## 2020-05-18 DIAGNOSIS — I63542 Cerebral infarction due to unspecified occlusion or stenosis of left cerebellar artery: Principal | ICD-10-CM | POA: Diagnosis present

## 2020-05-18 DIAGNOSIS — Z20822 Contact with and (suspected) exposure to covid-19: Secondary | ICD-10-CM | POA: Diagnosis present

## 2020-05-18 DIAGNOSIS — Y92009 Unspecified place in unspecified non-institutional (private) residence as the place of occurrence of the external cause: Secondary | ICD-10-CM

## 2020-05-18 DIAGNOSIS — Y9301 Activity, walking, marching and hiking: Secondary | ICD-10-CM | POA: Diagnosis present

## 2020-05-18 DIAGNOSIS — I7774 Dissection of vertebral artery: Secondary | ICD-10-CM

## 2020-05-18 DIAGNOSIS — I6340 Cerebral infarction due to embolism of unspecified cerebral artery: Secondary | ICD-10-CM | POA: Diagnosis present

## 2020-05-18 DIAGNOSIS — I110 Hypertensive heart disease with heart failure: Secondary | ICD-10-CM | POA: Diagnosis present

## 2020-05-18 LAB — CBC WITH DIFFERENTIAL/PLATELET
Abs Immature Granulocytes: 0.08 10*3/uL — ABNORMAL HIGH (ref 0.00–0.07)
Basophils Absolute: 0 10*3/uL (ref 0.0–0.1)
Basophils Relative: 0 %
Eosinophils Absolute: 0 10*3/uL (ref 0.0–0.5)
Eosinophils Relative: 0 %
HCT: 38.6 % (ref 36.0–46.0)
Hemoglobin: 12.7 g/dL (ref 12.0–15.0)
Immature Granulocytes: 1 %
Lymphocytes Relative: 12 %
Lymphs Abs: 1.4 10*3/uL (ref 0.7–4.0)
MCH: 29.7 pg (ref 26.0–34.0)
MCHC: 32.9 g/dL (ref 30.0–36.0)
MCV: 90.2 fL (ref 80.0–100.0)
Monocytes Absolute: 0.5 10*3/uL (ref 0.1–1.0)
Monocytes Relative: 4 %
Neutro Abs: 9.6 10*3/uL — ABNORMAL HIGH (ref 1.7–7.7)
Neutrophils Relative %: 83 %
Platelets: 206 10*3/uL (ref 150–400)
RBC: 4.28 MIL/uL (ref 3.87–5.11)
RDW: 12.8 % (ref 11.5–15.5)
WBC: 11.6 10*3/uL — ABNORMAL HIGH (ref 4.0–10.5)
nRBC: 0 % (ref 0.0–0.2)

## 2020-05-18 LAB — BASIC METABOLIC PANEL
Anion gap: 11 (ref 5–15)
BUN: 12 mg/dL (ref 8–23)
CO2: 25 mmol/L (ref 22–32)
Calcium: 9.2 mg/dL (ref 8.9–10.3)
Chloride: 101 mmol/L (ref 98–111)
Creatinine, Ser: 0.65 mg/dL (ref 0.44–1.00)
GFR, Estimated: >60 mL/min (ref >60)
Glucose, Bld: 143 mg/dL — ABNORMAL HIGH (ref 70–99)
Potassium: 2.7 mmol/L — CL (ref 3.5–5.1)
Sodium: 137 mmol/L (ref 135–145)

## 2020-05-18 LAB — TROPONIN I (HIGH SENSITIVITY): Troponin I (High Sensitivity): 5 ng/L (ref <18)

## 2020-05-18 LAB — ETHANOL: Alcohol, Ethyl (B): <10 mg/dL (ref <10)

## 2020-05-18 LAB — ACETAMINOPHEN LEVEL: Acetaminophen (Tylenol), Serum: <10 ug/mL — ABNORMAL LOW (ref 10–30)

## 2020-05-18 LAB — MAGNESIUM: Magnesium: 1.8 mg/dL (ref 1.7–2.4)

## 2020-05-18 LAB — SALICYLATE LEVEL: Salicylate Lvl: <7 mg/dL — ABNORMAL LOW (ref 7.0–30.0)

## 2020-05-18 MED ORDER — ONDANSETRON HCL 4 MG/2ML IJ SOLN
4.0000 mg | Freq: Once | INTRAMUSCULAR | Status: AC
  Administered 2020-05-18: 4 mg via INTRAVENOUS
  Filled 2020-05-18: qty 2

## 2020-05-18 MED ORDER — POTASSIUM CHLORIDE 10 MEQ/100ML IV SOLN
10.0000 meq | Freq: Once | INTRAVENOUS | Status: AC
  Administered 2020-05-19: 10 meq via INTRAVENOUS
  Filled 2020-05-18: qty 100

## 2020-05-18 MED ORDER — MAGNESIUM SULFATE 2 GM/50ML IV SOLN
2.0000 g | Freq: Once | INTRAVENOUS | Status: AC
  Administered 2020-05-19: 2 g via INTRAVENOUS
  Filled 2020-05-18: qty 50

## 2020-05-18 MED ORDER — MECLIZINE HCL 25 MG PO TABS
25.0000 mg | ORAL_TABLET | Freq: Once | ORAL | Status: AC
  Administered 2020-05-18: 25 mg via ORAL
  Filled 2020-05-18: qty 1

## 2020-05-18 MED ORDER — POTASSIUM CHLORIDE CRYS ER 20 MEQ PO TBCR
40.0000 meq | EXTENDED_RELEASE_TABLET | Freq: Once | ORAL | Status: DC
  Filled 2020-05-18: qty 2

## 2020-05-18 NOTE — ED Triage Notes (Signed)
Pt has been feeling lethargic and dizzy all day beginning when she woke up around 8am.  Went to the bathroom this evening, through up, got up and then fell, hitting her hand on the door frame and her neck on the trashcan. Stated she was too weak to get of floor.  She called daughter who called 911.   Pt's pupils are fairly restricted but reactive by EMS assessment and my own.  She states she has taken 2 tylenol and only takes HCTZ at home for HTN.

## 2020-05-18 NOTE — ED Provider Notes (Signed)
11:07 PM Assumed care from Dr. Stevie Kern, please see their note for full history, physical and decision making until this point. In brief this is a 65 y.o. year old female who presented to the ED tonight with No chief complaint on file.     MRI and reassessment.   Received call from MRI tech, appears to have positive finding, will add on MRA head/neck. Family updated.   D/w neurology for consultation.   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  D/w medicine for admission.   CRITICAL CARE Performed by: Marily Memos Total critical care time: 35 minutes Critical care time was exclusive of separately billable procedures and treating other patients. Critical care was necessary to treat or prevent imminent or life-threatening deterioration. Critical care was time spent personally by me on the following activities: development of treatment plan with patient and/or surrogate as well as nursing, discussions with consultants, evaluation of patient's response to treatment, examination of patient, obtaining history from patient or surrogate, ordering and performing treatments and interventions, ordering and review of laboratory studies, ordering and review of radiographic studies, pulse oximetry and re-evaluation of patient's condition.   Labs, studies and imaging reviewed by myself and considered in medical decision making if ordered. Imaging interpreted by radiology.  Labs Reviewed  CBC WITH DIFFERENTIAL/PLATELET - Abnormal; Notable for the following components:      Result Value   WBC 11.6 (*)    Neutro Abs 9.6 (*)    Abs Immature Granulocytes 0.08 (*)    All other components within normal limits  BASIC METABOLIC PANEL - Abnormal; Notable for the following components:   Potassium 2.7 (*)    Glucose, Bld 143 (*)    All other components within normal limits  RESP PANEL BY RT-PCR (FLU A&B, COVID) ARPGX2  ETHANOL  URINALYSIS, ROUTINE W  REFLEX MICROSCOPIC  RAPID URINE DRUG SCREEN, HOSP PERFORMED  SALICYLATE LEVEL  ACETAMINOPHEN LEVEL  MAGNESIUM  TROPONIN I (HIGH SENSITIVITY)  TROPONIN I (HIGH SENSITIVITY)    DG Chest Portable 1 View  Final Result    MR BRAIN WO CONTRAST    (Results Pending)    No follow-ups on file.    Malique Driskill, Barbara Cower, MD 05/19/20 [phone]

## 2020-05-18 NOTE — ED Notes (Signed)
Patient transported to MRI 

## 2020-05-19 ENCOUNTER — Inpatient Hospital Stay (HOSPITAL_COMMUNITY): Payer: Medicare HMO

## 2020-05-19 ENCOUNTER — Encounter (HOSPITAL_COMMUNITY): Payer: Self-pay | Admitting: Internal Medicine

## 2020-05-19 DIAGNOSIS — D62 Acute posthemorrhagic anemia: Secondary | ICD-10-CM | POA: Diagnosis not present

## 2020-05-19 DIAGNOSIS — F419 Anxiety disorder, unspecified: Secondary | ICD-10-CM | POA: Diagnosis present

## 2020-05-19 DIAGNOSIS — I63212 Cerebral infarction due to unspecified occlusion or stenosis of left vertebral arteries: Secondary | ICD-10-CM | POA: Diagnosis not present

## 2020-05-19 DIAGNOSIS — I63219 Cerebral infarction due to unspecified occlusion or stenosis of unspecified vertebral arteries: Secondary | ICD-10-CM | POA: Diagnosis not present

## 2020-05-19 DIAGNOSIS — I10 Essential (primary) hypertension: Secondary | ICD-10-CM | POA: Diagnosis not present

## 2020-05-19 DIAGNOSIS — I63119 Cerebral infarction due to embolism of unspecified vertebral artery: Secondary | ICD-10-CM | POA: Diagnosis not present

## 2020-05-19 DIAGNOSIS — I639 Cerebral infarction, unspecified: Secondary | ICD-10-CM | POA: Diagnosis not present

## 2020-05-19 DIAGNOSIS — I6340 Cerebral infarction due to embolism of unspecified cerebral artery: Secondary | ICD-10-CM | POA: Diagnosis present

## 2020-05-19 DIAGNOSIS — I110 Hypertensive heart disease with heart failure: Secondary | ICD-10-CM | POA: Diagnosis present

## 2020-05-19 DIAGNOSIS — H5500 Unspecified nystagmus: Secondary | ICD-10-CM | POA: Diagnosis present

## 2020-05-19 DIAGNOSIS — Y9301 Activity, walking, marching and hiking: Secondary | ICD-10-CM | POA: Diagnosis present

## 2020-05-19 DIAGNOSIS — E7800 Pure hypercholesterolemia, unspecified: Secondary | ICD-10-CM | POA: Diagnosis present

## 2020-05-19 DIAGNOSIS — R29705 NIHSS score 5: Secondary | ICD-10-CM | POA: Diagnosis present

## 2020-05-19 DIAGNOSIS — E785 Hyperlipidemia, unspecified: Secondary | ICD-10-CM | POA: Diagnosis not present

## 2020-05-19 DIAGNOSIS — R42 Dizziness and giddiness: Secondary | ICD-10-CM | POA: Diagnosis present

## 2020-05-19 DIAGNOSIS — E876 Hypokalemia: Secondary | ICD-10-CM | POA: Diagnosis present

## 2020-05-19 DIAGNOSIS — I6322 Cerebral infarction due to unspecified occlusion or stenosis of basilar arteries: Secondary | ICD-10-CM

## 2020-05-19 DIAGNOSIS — Z20822 Contact with and (suspected) exposure to covid-19: Secondary | ICD-10-CM | POA: Diagnosis present

## 2020-05-19 DIAGNOSIS — Z8249 Family history of ischemic heart disease and other diseases of the circulatory system: Secondary | ICD-10-CM | POA: Diagnosis not present

## 2020-05-19 DIAGNOSIS — I6389 Other cerebral infarction: Secondary | ICD-10-CM

## 2020-05-19 DIAGNOSIS — I63113 Cerebral infarction due to embolism of bilateral vertebral arteries: Secondary | ICD-10-CM

## 2020-05-19 DIAGNOSIS — Y92009 Unspecified place in unspecified non-institutional (private) residence as the place of occurrence of the external cause: Secondary | ICD-10-CM | POA: Diagnosis not present

## 2020-05-19 DIAGNOSIS — I5032 Chronic diastolic (congestive) heart failure: Secondary | ICD-10-CM | POA: Diagnosis present

## 2020-05-19 DIAGNOSIS — W19XXXA Unspecified fall, initial encounter: Secondary | ICD-10-CM | POA: Diagnosis present

## 2020-05-19 DIAGNOSIS — I7774 Dissection of vertebral artery: Secondary | ICD-10-CM

## 2020-05-19 DIAGNOSIS — R270 Ataxia, unspecified: Secondary | ICD-10-CM | POA: Diagnosis present

## 2020-05-19 DIAGNOSIS — Z841 Family history of disorders of kidney and ureter: Secondary | ICD-10-CM | POA: Diagnosis not present

## 2020-05-19 DIAGNOSIS — R4701 Aphasia: Secondary | ICD-10-CM | POA: Diagnosis present

## 2020-05-19 DIAGNOSIS — I6329 Cerebral infarction due to unspecified occlusion or stenosis of other precerebral arteries: Secondary | ICD-10-CM | POA: Diagnosis present

## 2020-05-19 DIAGNOSIS — I63542 Cerebral infarction due to unspecified occlusion or stenosis of left cerebellar artery: Secondary | ICD-10-CM | POA: Diagnosis present

## 2020-05-19 DIAGNOSIS — E559 Vitamin D deficiency, unspecified: Secondary | ICD-10-CM | POA: Diagnosis present

## 2020-05-19 DIAGNOSIS — I63112 Cerebral infarction due to embolism of left vertebral artery: Secondary | ICD-10-CM | POA: Diagnosis not present

## 2020-05-19 DIAGNOSIS — Z79899 Other long term (current) drug therapy: Secondary | ICD-10-CM | POA: Diagnosis not present

## 2020-05-19 DIAGNOSIS — R2689 Other abnormalities of gait and mobility: Secondary | ICD-10-CM | POA: Diagnosis not present

## 2020-05-19 DIAGNOSIS — G936 Cerebral edema: Secondary | ICD-10-CM

## 2020-05-19 DIAGNOSIS — R4789 Other speech disturbances: Secondary | ICD-10-CM | POA: Diagnosis present

## 2020-05-19 DIAGNOSIS — D72829 Elevated white blood cell count, unspecified: Secondary | ICD-10-CM

## 2020-05-19 DIAGNOSIS — E8801 Alpha-1-antitrypsin deficiency: Secondary | ICD-10-CM | POA: Diagnosis present

## 2020-05-19 DIAGNOSIS — Z825 Family history of asthma and other chronic lower respiratory diseases: Secondary | ICD-10-CM | POA: Diagnosis not present

## 2020-05-19 LAB — RAPID URINE DRUG SCREEN, HOSP PERFORMED
Amphetamines: NOT DETECTED
Barbiturates: NOT DETECTED
Benzodiazepines: NOT DETECTED
Cocaine: NOT DETECTED
Opiates: NOT DETECTED
Tetrahydrocannabinol: NOT DETECTED

## 2020-05-19 LAB — GLUCOSE, CAPILLARY: Glucose-Capillary: 84 mg/dL (ref 70–99)

## 2020-05-19 LAB — LIPID PANEL
Cholesterol: 237 mg/dL — ABNORMAL HIGH (ref 0–200)
HDL: 66 mg/dL (ref >40)
LDL Cholesterol: 160 mg/dL — ABNORMAL HIGH (ref 0–99)
Total CHOL/HDL Ratio: 3.6 RATIO
Triglycerides: 53 mg/dL (ref <150)
VLDL: 11 mg/dL (ref 0–40)

## 2020-05-19 LAB — CBC
HCT: 36.4 % (ref 36.0–46.0)
Hemoglobin: 12.4 g/dL (ref 12.0–15.0)
MCH: 30 pg (ref 26.0–34.0)
MCHC: 34.1 g/dL (ref 30.0–36.0)
MCV: 88.1 fL (ref 80.0–100.0)
Platelets: 215 10*3/uL (ref 150–400)
RBC: 4.13 MIL/uL (ref 3.87–5.11)
RDW: 12.8 % (ref 11.5–15.5)
WBC: 10.8 10*3/uL — ABNORMAL HIGH (ref 4.0–10.5)
nRBC: 0 % (ref 0.0–0.2)

## 2020-05-19 LAB — HEMOGLOBIN A1C
Hgb A1c MFr Bld: 5.5 % (ref 4.8–5.6)
Mean Plasma Glucose: 111.15 mg/dL

## 2020-05-19 LAB — URINALYSIS, ROUTINE W REFLEX MICROSCOPIC
Bilirubin Urine: NEGATIVE
Glucose, UA: NEGATIVE mg/dL
Hgb urine dipstick: NEGATIVE
Ketones, ur: 20 mg/dL — AB
Leukocytes,Ua: NEGATIVE
Nitrite: NEGATIVE
Protein, ur: NEGATIVE mg/dL
Specific Gravity, Urine: 1.019 (ref 1.005–1.030)
pH: 7 (ref 5.0–8.0)

## 2020-05-19 LAB — RESP PANEL BY RT-PCR (FLU A&B, COVID) ARPGX2
Influenza A by PCR: NEGATIVE
Influenza B by PCR: NEGATIVE
SARS Coronavirus 2 by RT PCR: NEGATIVE

## 2020-05-19 LAB — TSH: TSH: 0.647 u[IU]/mL (ref 0.350–4.500)

## 2020-05-19 LAB — HIV ANTIBODY (ROUTINE TESTING W REFLEX): HIV Screen 4th Generation wRfx: NONREACTIVE

## 2020-05-19 LAB — MRSA PCR SCREENING: MRSA by PCR: NEGATIVE

## 2020-05-19 LAB — ECHOCARDIOGRAM COMPLETE
Area-P 1/2: 3.85 cm2
S' Lateral: 2.3 cm

## 2020-05-19 LAB — TROPONIN I (HIGH SENSITIVITY): Troponin I (High Sensitivity): 5 ng/L (ref <18)

## 2020-05-19 MED ORDER — ACETAMINOPHEN 325 MG PO TABS
650.0000 mg | ORAL_TABLET | ORAL | Status: DC | PRN
  Administered 2020-05-19 – 2020-05-23 (×5): 650 mg via ORAL
  Filled 2020-05-19 (×5): qty 2

## 2020-05-19 MED ORDER — ORAL CARE MOUTH RINSE
15.0000 mL | Freq: Two times a day (BID) | OROMUCOSAL | Status: DC
  Administered 2020-05-19 – 2020-05-23 (×9): 15 mL via OROMUCOSAL

## 2020-05-19 MED ORDER — DOCUSATE SODIUM 100 MG PO CAPS: 100.0000 mg | ORAL_CAPSULE | Freq: Two times a day (BID) | ORAL | Status: DC | PRN

## 2020-05-19 MED ORDER — POLYETHYLENE GLYCOL 3350 17 G PO PACK
17.0000 g | PACK | Freq: Every day | ORAL | Status: DC | PRN
Start: 2020-05-19 — End: 2020-05-23

## 2020-05-19 MED ORDER — CLOPIDOGREL BISULFATE 75 MG PO TABS
75.0000 mg | ORAL_TABLET | Freq: Every day | ORAL | Status: DC
  Administered 2020-05-20 – 2020-05-23 (×4): 75 mg via ORAL
  Filled 2020-05-19 (×4): qty 1

## 2020-05-19 MED ORDER — ASPIRIN EC 81 MG PO TBEC
81.0000 mg | DELAYED_RELEASE_TABLET | Freq: Every day | ORAL | Status: DC
  Administered 2020-05-20 – 2020-05-23 (×4): 81 mg via ORAL
  Filled 2020-05-19 (×4): qty 1

## 2020-05-19 MED ORDER — VITAMIN B-12 1000 MCG PO TABS
1000.0000 ug | ORAL_TABLET | Freq: Every day | ORAL | Status: DC
  Administered 2020-05-20 – 2020-05-23 (×4): 1000 ug via ORAL
  Filled 2020-05-19 (×5): qty 1

## 2020-05-19 MED ORDER — STROKE: EARLY STAGES OF RECOVERY BOOK: Freq: Once | Status: DC

## 2020-05-19 MED ORDER — POTASSIUM CHLORIDE CRYS ER 20 MEQ PO TBCR
40.0000 meq | EXTENDED_RELEASE_TABLET | Freq: Once | ORAL | Status: AC
  Administered 2020-05-19: 40 meq via ORAL

## 2020-05-19 MED ORDER — PROMETHAZINE HCL 25 MG/ML IJ SOLN
12.5000 mg | Freq: Once | INTRAMUSCULAR | Status: AC
  Administered 2020-05-19: 12.5 mg via INTRAVENOUS
  Filled 2020-05-19: qty 1

## 2020-05-19 MED ORDER — GADOBUTROL 1 MMOL/ML IV SOLN
6.5000 mL | Freq: Once | INTRAVENOUS | Status: AC | PRN
  Administered 2020-05-19: 6.5 mL via INTRAVENOUS

## 2020-05-19 MED ORDER — PANTOPRAZOLE SODIUM 40 MG PO TBEC
40.0000 mg | DELAYED_RELEASE_TABLET | Freq: Every day | ORAL | Status: DC
  Administered 2020-05-20 – 2020-05-23 (×4): 40 mg via ORAL
  Filled 2020-05-19 (×5): qty 1

## 2020-05-19 MED ORDER — ACETAMINOPHEN 650 MG RE SUPP: 650.0000 mg | RECTAL | Status: DC | PRN

## 2020-05-19 MED ORDER — CHOLECALCIFEROL 10 MCG (400 UNIT) PO TABS
400.0000 [IU] | ORAL_TABLET | Freq: Every day | ORAL | Status: DC
  Administered 2020-05-20 – 2020-05-23 (×4): 400 [IU] via ORAL
  Filled 2020-05-19 (×5): qty 1

## 2020-05-19 MED ORDER — POTASSIUM CHLORIDE 10 MEQ/100ML IV SOLN
10.0000 meq | INTRAVENOUS | Status: AC
  Administered 2020-05-19 (×4): 10 meq via INTRAVENOUS
  Filled 2020-05-19 (×4): qty 100

## 2020-05-19 MED ORDER — CLOPIDOGREL BISULFATE 300 MG PO TABS
300.0000 mg | ORAL_TABLET | Freq: Once | ORAL | Status: AC
  Administered 2020-05-19: 300 mg via ORAL
  Filled 2020-05-19: qty 1

## 2020-05-19 MED ORDER — ONDANSETRON HCL 4 MG/2ML IJ SOLN: 4.0000 mg | Freq: Four times a day (QID) | INTRAMUSCULAR | Status: DC | PRN

## 2020-05-19 MED ORDER — ACETAMINOPHEN 160 MG/5ML PO SOLN
650.0000 mg | ORAL | Status: DC | PRN
Start: 2020-05-19 — End: 2020-05-22

## 2020-05-19 MED ORDER — ASPIRIN 81 MG PO CHEW
324.0000 mg | CHEWABLE_TABLET | ORAL | Status: AC
  Administered 2020-05-19: 324 mg via ORAL
  Filled 2020-05-19: qty 4

## 2020-05-19 MED ORDER — CHLORHEXIDINE GLUCONATE CLOTH 2 % EX PADS
6.0000 | MEDICATED_PAD | Freq: Every day | CUTANEOUS | Status: DC
  Administered 2020-05-19 – 2020-05-22 (×4): 6 via TOPICAL

## 2020-05-19 NOTE — Evaluation (Signed)
Physical Therapy Evaluation Patient Details Name: Jeanette Barnes MRN: [id number] DOB: 01/06/1956 Today's Date: 05/19/2020   History of Present Illness  Jeanette Barnes is a 65 y.o. female with medical history significant of hypertension, hyperlipidemia presenting to the ED for evaluation of vertigo and ataxia. Fell with possibly hitting her head. MRI showing patchy acute ischemic nonhemorrhagic infarcts involving the left cerebellum, cerebellar vermis, right temporal occipital region, and left midbrain/pons.MRA head and neck showing findings concerning for acute extracranial vertebral artery dissection  Clinical Impression   Pt admitted with above diagnosis. PTA patient was independent with all activities, living alone and about to start a new job in a few days.  Pt currently requires 2 person moderate assist for transfers and limited gait.  Patient with the following functional limitations due to the deficits listed below: impaired gait due to impaired balance and decreased coordination. Pt will benefit from skilled PT to increase their independence and safety with mobility to allow discharge to the venue listed below.       Follow Up Recommendations CIR    Equipment Recommendations  Rolling walker with 5" wheels    Recommendations for Other Services Rehab consult     Precautions / Restrictions Precautions Precautions: Fall Restrictions Weight Bearing Restrictions: No      Mobility  Bed Mobility Overal bed mobility: Needs Assistance Bed Mobility: Sidelying to Sit   Sidelying to sit: Min assist            Transfers Overall transfer level: Needs assistance Equipment used: Rolling walker (2 wheeled);None Transfers: Sit to/from [employer] Sit to Stand: Mod assist;+2 physical assistance;+2 safety/equipment Stand pivot transfers: Mod assist;+2 physical assistance;+2 safety/equipment       General transfer comment: initial stand-pivot with mod assist +2  without device; pt with difficulty fully standing and with left lean; with RW mod assist +2 to stand and pivot BSC to recliner  Ambulation/Gait Ambulation/Gait assistance: Mod assist;+2 physical assistance;+2 safety/equipment Gait Distance (Feet): 2 Feet Assistive device: Rolling walker (2 wheeled) Gait Pattern/deviations: Step-to pattern     General Gait Details: pivotal steps with RW to recliner  Stairs            Wheelchair Mobility    Modified Rankin (Stroke Patients Only) Modified Rankin (Stroke Patients Only) Pre-Morbid Rankin Score: No symptoms Modified Rankin: Moderately severe disability     Balance Overall balance assessment: Needs assistance Sitting-balance support: Feet supported Sitting balance-Leahy Scale: Poor Sitting balance - Comments: initially had a posterior lean; also once she got to [facility] she had a left lateral lean   Standing balance support: Bilateral upper extremity supported Standing balance-Leahy Scale: Poor Standing balance comment: reliant on RW and external A of therapists                             Pertinent Vitals/Pain Pain Assessment: No/denies pain    Home Living Family/patient expects to be discharged to:: Private residence Living Arrangements: Alone   Type of Home: Apartment (town house) Home Access: Stairs to enter Entrance Stairs-Rails: None Secretary/administrator of Steps: 2 Home Layout: One level Home Equipment: None      Prior Function Level of Independence: Independent         Comments: driving, 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)     Hand Dominance   Dominant Hand: Right    Extremity/Trunk Assessment   Upper Extremity Assessment Upper Extremity Assessment: Defer to OT evaluation  LUE Deficits / Details: Decreased coordination for finger<>nose with and without eyes closed LUE Coordination: decreased fine motor;decreased gross motor    Lower Extremity Assessment Lower  Extremity Assessment: LLE deficits/detail LLE Deficits / Details: mild decreased coordination with heel to shin LLE Sensation: WNL LLE Coordination: decreased gross motor       Communication   Communication: No difficulties  Cognition Arousal/Alertness: Awake/alert Behavior During Therapy: Flat affect Overall Cognitive Status: Impaired/Different from baseline Area of Impairment: Safety/judgement;Awareness;Problem solving                         Safety/Judgement: Decreased awareness of safety;Decreased awareness of deficits Awareness: Emergent Problem Solving: Slow processing;Requires verbal cues;Requires tactile cues General Comments: At times slow to respond to questions. Very low voice output      General Comments General comments (skin integrity, edema, etc.): supine:120/49 (69)  HR 61; after transfers: 130/65 (84)  HR 65    Exercises     Assessment/Plan    PT Assessment Patient needs continued PT services  PT Problem List Decreased strength;Decreased activity tolerance;Decreased balance;Decreased mobility;Decreased coordination;Decreased knowledge of use of DME;Decreased safety awareness       PT Treatment Interventions DME instruction;Gait training;Stair training;Functional mobility training;Therapeutic activities;Therapeutic exercise;Balance training;Neuromuscular re-education;Cognitive remediation;Patient/family education    PT Goals (Current goals can be found in the Care Plan section)  Acute Rehab PT Goals Patient Stated Goal: to be able to work (was suppose to start a job in 2 days) PT Goal Formulation: With patient Time For Goal Achievement: 06/02/20 Potential to Achieve Goals: Good    Frequency Min 4X/week   Barriers to discharge Decreased caregiver support lives alone    Co-evaluation   Reason for Co-Treatment: For patient/therapist safety;To address functional/ADL transfers PT goals addressed during session: Mobility/safety with  mobility;Balance;Strengthening/ROM;Proper use of DME OT goals addressed during session: Strengthening/ROM;ADL's and self-care       AM-PAC PT "6 Clicks" Mobility  Outcome Measure Help needed turning from your back to your side while in a flat bed without using bedrails?: None Help needed moving from lying on your back to sitting on the side of a flat bed without using bedrails?: A Little Help needed moving to and from a bed to a chair (including a wheelchair)?: Total Help needed standing up from a chair using your arms (e.g., wheelchair or bedside chair)?: Total Help needed to walk in hospital room?: Total Help needed climbing 3-5 steps with a railing? : Total 6 Click Score: 11    End of Session Equipment Utilized During Treatment: Gait belt Activity Tolerance: Treatment limited secondary to medical complications (Comment) (dizziness) Patient left: in chair;with call bell/phone within reach;with chair alarm set Nurse Communication: Mobility status;Other (comment) (wants purewick and ?'s about her BP meds) PT Visit Diagnosis: Dizziness and giddiness (R42);Ataxic gait (R26.0);History of falling (Z91.81)    Time: 0922-1000 PT Time Calculation (min) (ACUTE ONLY): 38 min   Charges:   PT Evaluation $PT Eval Moderate Complexity: 1 Mod           Jerolyn Center, PT Pager [phone]   Zena Amos 05/19/2020, 10:45 AM

## 2020-05-19 NOTE — H&P (Signed)
History and Physical    Jeanette Barnes MVH:[id number] DOB: February 05, 1956 DOA: 05/18/2020  PCP: Macy Mis, MD Patient coming from: Home  Chief Complaint: Vertigo, ataxia  HPI: Jeanette Barnes is a 65 y.o. female with medical history significant of hypertension, hyperlipidemia presenting to the ED for evaluation of vertigo and ataxia. History provided by patient and her daughter at bedside. Daughter states patient was in her usual state of health until 3/5 evening. On 3/6, she woke up with a headache, felt dizzy and vomited. She felt unsteady while walking and had a fall at home. Daughter states she was not there at that time but when she came to check on the patient she was found on the floor and was conscious. She was too weak to get up from the floor and family called EMS. Patient thinks she hit her head during the fall but is not complaining of any headaches or neck pain at present. Denies any other injuries from the fall. Denies history of prior stroke. She is vaccinated against COVID. Denies fevers, cough, or shortness of breath.  ED Course: Blood pressure slightly elevated on arrival, subsequent readings improved. Labs showing WBC 11.6, hemoglobin 12.7, platelet count 206K. Sodium 137, potassium 2.7, chloride 101, bicarb 25, BUN 12, creatinine 0.6, glucose 143. High-sensitivity troponin negative x2. Magnesium 1.8. UA and UDS pending. SARS-CoV-2 PCR test negative. Influenza panel negative. Blood ethanol, acetaminophen, and salicylate levels undetectable.  Chest x-ray showing no active disease.  Brain MRI showing patchy acute ischemic nonhemorrhagic infarcts involving the left cerebellum, cerebellar vermis, right temporal occipital region, and left midbrain/pons.  MRA head and neck showing findings concerning for acute extracranial vertebral artery dissection.  Neurology consulted.  Patient was given meclizine, Zofran, oral potassium 40 mEq, IV potassium 10 mEq, Phenergan, and IV magnesium 2  g.  Review of Systems:  All systems reviewed and apart from history of presenting illness, are negative.  Past Medical History:  Diagnosis Date  . AAT (alpha-1-antitrypsin) deficiency (HCC)   . Anxiety   . Heart murmur    mitral valve prolapse  . Hypercholesteremia   . Hypertension   . Pulmonary nodule   . Vitamin D deficiency     Past Surgical History:  Procedure Laterality Date  . ABDOMINAL HYSTERECTOMY    . BREAST BIOPSY    . BREAST EXCISIONAL BIOPSY Bilateral 1990's   2 Left, 1 Right, all benign     reports that she has never smoked. She has never used smokeless tobacco. She reports that she does not drink alcohol and does not use drugs.  No Known Allergies  Family History  Problem Relation Age of Onset  . Emphysema Father   . Hypertension Brother   . Kidney disease Brother     Prior to Admission medications   Medication Sig Start Date End Date Taking? Authorizing Provider  acetaminophen (TYLENOL) 500 MG tablet Take 1,000 mg by mouth every 6 (six) hours as needed for mild pain or headache.   Yes [provider]  albuterol (VENTOLIN HFA) 108 (90 Base) MCG/ACT inhaler Inhale 2 puffs into the lungs every 6 (six) hours as needed for wheezing or shortness of breath. 12/06/19  Yes Mannam, Praveen, MD  Biotin 1 MG CAPS Take 1 mg by mouth daily.   Yes [provider]  cholecalciferol (VITAMIN D) 400 units TABS tablet Take 400 Units by mouth.   Yes [provider]  hydrochlorothiazide (HYDRODIURIL) 25 MG tablet Take 25 mg by mouth daily. 10/12/16  Yes  [provider]  naproxen sodium (ALEVE) 220 MG tablet Take 220 mg by mouth daily as needed (pain/headace).   Yes [provider]  OVER THE COUNTER MEDICATION Take 1 tablet by mouth daily. Stress relief gummy---olly brand   Yes [provider]  vitamin B-12 (CYANOCOBALAMIN) 1000 MCG tablet Take 1,000 mcg by mouth daily.   Yes [provider]  VITAMIN E PO Take 1 tablet  by mouth as needed.   Yes [provider]    Physical Exam: Vitals:   05/18/20 2215 05/18/20 2300 05/18/20 2330 05/19/20 0111  BP: 138/67 (!) 146/65 (!) 143/58 (!) 156/77  Pulse: 70   66  Resp: 16 18 (!) 23 16  Temp:      TempSrc:      SpO2: 98%  95% 98%    Physical Exam Constitutional:      General: She is not in acute distress. HENT:     Head: Normocephalic and atraumatic.  Eyes:     Extraocular Movements: Extraocular movements intact.     Conjunctiva/sclera: Conjunctivae normal.  Cardiovascular:     Rate and Rhythm: Normal rate and regular rhythm.     Pulses: Normal pulses.  Pulmonary:     Effort: Pulmonary effort is normal. No respiratory distress.     Breath sounds: Normal breath sounds. No wheezing or rales.  Abdominal:     General: Bowel sounds are normal. There is no distension.     Palpations: Abdomen is soft.     Tenderness: There is no abdominal tenderness.  Musculoskeletal:        General: No swelling or tenderness.     Cervical back: Normal range of motion and neck supple.  Skin:    General: Skin is warm and dry.  Neurological:     General: No focal deficit present.     Mental Status: She is alert and oriented to person, place, and time.     Comments: No focal weakness or sensory deficit.     Labs on Admission: I have personally reviewed following labs and imaging studies  CBC: Recent Labs  Lab 05/18/20 2119  WBC 11.6*  NEUTROABS 9.6*  HGB 12.7  HCT 38.6  MCV 90.2  PLT 206   Basic Metabolic Panel: Recent Labs  Lab 05/18/20 2119  NA 137  K 2.7*  CL 101  CO2 25  GLUCOSE 143*  BUN 12  CREATININE 0.65  CALCIUM 9.2  MG 1.8   GFR: CrCl cannot be calculated (Unknown ideal weight.). Liver Function Tests: No results for input(s): AST, ALT, ALKPHOS, BILITOT, PROT, ALBUMIN in the last 168 hours. No results for input(s): LIPASE, AMYLASE in the last 168 hours. No results for input(s): AMMONIA in the last 168 hours. Coagulation  Profile: No results for input(s): INR, PROTIME in the last 168 hours. Cardiac Enzymes: No results for input(s): CKTOTAL, CKMB, CKMBINDEX, TROPONINI in the last 168 hours. BNP (last 3 results) No results for input(s): PROBNP in the last 8760 hours. HbA1C: No results for input(s): HGBA1C in the last 72 hours. CBG: No results for input(s): GLUCAP in the last 168 hours. Lipid Profile: No results for input(s): CHOL, HDL, LDLCALC, TRIG, CHOLHDL, LDLDIRECT in the last 72 hours. Thyroid Function Tests: No results for input(s): TSH, T4TOTAL, FREET4, T3FREE, THYROIDAB in the last 72 hours. Anemia Panel: No results for input(s): VITAMINB12, FOLATE, FERRITIN, TIBC, IRON, RETICCTPCT in the last 72 hours. Urine analysis: No results found for: COLORURINE, APPEARANCEUR, LABSPEC, PHURINE, GLUCOSEU, HGBUR, BILIRUBINUR, KETONESUR,  PROTEINUR, UROBILINOGEN, NITRITE, LEUKOCYTESUR  Radiological Exams on Admission: MR ANGIO HEAD WO CONTRAST  Result Date: 05/19/2020 CLINICAL DATA:  Initial evaluation for neuro deficit, stroke suspected, vertigo. EXAM: MRI HEAD WITHOUT CONTRAST MRA HEAD WITHOUT CONTRAST MRA NECK WITHOUT AND WITH CONTRAST TECHNIQUE: Multiplanar, multiecho pulse sequences of the brain and surrounding structures were obtained without intravenous contrast. Angiographic images of the Circle of Willis were obtained using MRA technique without intravenous contrast. Angiographic images of the neck were obtained using MRA technique without and with intravenous contrast. Carotid stenosis measurements (when applicable) are obtained utilizing NASCET criteria, using the distal internal carotid diameter as the denominator. CONTRAST:  6.68mL GADAVIST GADOBUTROL 1 MMOL/ML IV SOLN COMPARISON:  Prior CT from 03/30/2018. FINDINGS: MRI HEAD FINDINGS Brain: Cerebral volume within normal limits for patient age. No significant cerebral white matter disease for age. Patchy multifocal areas of restricted diffusion seen involving  the left cerebellum, most pronounced inferiorly. Minimal patchy involvement of the central cerebellar vermis. Additional patchy small volume areas of restricted diffusion seen involving the paramedian right temporal occipital region. Mild diffusion abnormality also noted at the left midbrain/pons. No associated hemorrhage or mass effect. Otherwise, gray-white matter differentiation well maintained. No other areas of chronic cortical infarction. No foci of susceptibility artifact to suggest acute or chronic intracranial hemorrhage. No mass lesion, midline shift or mass effect. No hydrocephalus or extra-axial fluid collection. Pituitary gland suprasellar region normal. Midline structures intact. Vascular: Major intracranial vascular flow voids are maintained. Skull and upper cervical spine: Craniocervical junction within normal limits. Bone marrow signal intensity normal. No scalp soft tissue abnormality. Reversal of the normal upper cervical lordosis noted. Sinuses/Orbits: Globes and orbital soft tissues demonstrate no acute finding. Paranasal sinuses are clear. No mastoid effusion. Inner ear structures grossly normal. Other: None. MRA HEAD FINDINGS ANTERIOR CIRCULATION: Visualized distal cervical segments are both widely patent with antegrade flow. Petrous, cavernous, and supraclinoid segments patent without stenosis or other abnormality. A1 segments widely patent. Normal anterior communicating artery complex. Anterior cerebral arteries patent to their distal aspects without stenosis. No M1 stenosis or occlusion. Normal MCA bifurcations. Distal MCA branches well perfused and symmetric. POSTERIOR CIRCULATION: Dominant right vertebral artery widely patent to the vertebrobasilar junction. Right PICA patent and well perfused. Faint flow seen within the partially visualized distal left vertebral artery, with relatively absent flow within the left V3 segment. The left vertebral artery appears occluded as it courses into  the cranial vault. Attenuated irregular flow seen within the left V4 segment distally, and remains grossly patent to the vertebrobasilar junction. Left PICA grossly patent at its origin. Basilar patent to its distal aspect without stenosis or visible abnormality. Right superior cerebral artery widely patent. Left SCA faintly visible and appears grossly patent on time-of-flight sequence, not seen on MIP reconstructions. Both PCAs primarily supplied via the basilar. PCAs appear well perfused to their distal aspects. No intracranial aneurysm. MRA NECK FINDINGS AORTIC ARCH: Visualized aortic arch normal in caliber with normal 3 vessel morphology. No hemodynamically significant stenosis seen about the origin of the great vessels. RIGHT CAROTID SYSTEM: Right CCA patent from its origin to the bifurcation without stenosis. Mild for age atheromatous irregularity about the right carotid bulb with no more than mild 20-25% stenosis by NASCET criteria. Right ICA patent distally without stenosis, evidence for dissection or occlusion. LEFT CAROTID SYSTEM: Left common carotid artery widely patent from its origin to the bifurcation. Mild for age atheromatous irregularity about the left carotid bulb with no more than mild 20-25% by  NASCET criteria. Left ICA otherwise patent without stenosis, evidence for dissection, or occlusion. Minimal irregularity about the mid cervical left ICA noted. VERTEBRAL ARTERIES: Both vertebral arteries arise from the subclavian arteries. Right vertebral artery likely dominant. There is a short-segment moderate approximate 50% stenosis involving the pre foraminal right V1 segment (series 1058, image 10). Otherwise, the right vertebral artery is widely patent within the neck without stenosis, evidence for dissection, or occlusion. On the left, the left vertebral artery appears occluded at its origin (series 18, image 32). Distally, the left ICA is markedly attenuated and diminutive, with near occlusion at  its distal V2 segment. Some flow is seen within the left V3 segment, but is also markedly attenuated and irregular. Given the presence of acute posterior circulation infarcts, finding raises the possibility for an acute dissection. IMPRESSION: MRI HEAD IMPRESSION: 1. Patchy acute ischemic nonhemorrhagic infarcts involving the left cerebellum, cerebellar vermis, right temporoccipital region, and left midbrain/pons. 2. Otherwise normal brain MRI for age. MRA HEAD IMPRESSION: 1. Occlusion of the left vertebral artery with concern for acute dissection as below. Attenuated irregular flow within the left V4 segment distally may in part be retrograde in nature. Dominant right vertebral artery widely patent. 2. Poor visualization of the left SCA, which also may be partially occluded given the distribution of infarcts. 3. Widely patent anterior circulation. MRA NECK IMPRESSION: 1. 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 Given the presence of acute posterior circulation infarcts, these findings raise the possibility for an acute arterial dissection. Correlation with dedicated CTA could be performed for further evaluation as warranted. 2. Short-segment moderate approximate 50% stenosis involving the pre foraminal right V1 segment. Otherwise wide patency of the dominant right vertebral artery. 3. Mild for age atheromatous change about the carotid bifurcations without significant stenosis. Otherwise wide patency of both carotid artery systems in the neck. Critical Value/emergent results were called by telephone at the time of interpretation on 05/19/2020 at 1:34 a.m. to provider Dr. Marily Memos, who verbally acknowledged these results. Electronically Signed   By: Rise Mu M.D.   On: 05/19/2020 01:56   MR ANGIO NECK W WO CONTRAST  Result Date: 05/19/2020 CLINICAL DATA:  Initial evaluation for neuro deficit,  stroke suspected, vertigo. EXAM: MRI HEAD WITHOUT CONTRAST MRA HEAD WITHOUT CONTRAST MRA NECK WITHOUT AND WITH CONTRAST TECHNIQUE: Multiplanar, multiecho pulse sequences of the brain and surrounding structures were obtained without intravenous contrast. Angiographic images of the Circle of Willis were obtained using MRA technique without intravenous contrast. Angiographic images of the neck were obtained using MRA technique without and with intravenous contrast. Carotid stenosis measurements (when applicable) are obtained utilizing NASCET criteria, using the distal internal carotid diameter as the denominator. CONTRAST:  6.73mL GADAVIST GADOBUTROL 1 MMOL/ML IV SOLN COMPARISON:  Prior CT from 03/30/2018. FINDINGS: MRI HEAD FINDINGS Brain: Cerebral volume within normal limits for patient age. No significant cerebral white matter disease for age. Patchy multifocal areas of restricted diffusion seen involving the left cerebellum, most pronounced inferiorly. Minimal patchy involvement of the central cerebellar vermis. Additional patchy small volume areas of restricted diffusion seen involving the paramedian right temporal occipital region. Mild diffusion abnormality also noted at the left midbrain/pons. No associated hemorrhage or mass effect. Otherwise, gray-white matter differentiation well maintained. No other areas of chronic cortical infarction. No foci of susceptibility artifact to suggest acute or chronic intracranial hemorrhage. No mass lesion, midline shift or  mass effect. No hydrocephalus or extra-axial fluid collection. Pituitary gland suprasellar region normal. Midline structures intact. Vascular: Major intracranial vascular flow voids are maintained. Skull and upper cervical spine: Craniocervical junction within normal limits. Bone marrow signal intensity normal. No scalp soft tissue abnormality. Reversal of the normal upper cervical lordosis noted. Sinuses/Orbits: Globes and orbital soft tissues demonstrate  no acute finding. Paranasal sinuses are clear. No mastoid effusion. Inner ear structures grossly normal. Other: None. MRA HEAD FINDINGS ANTERIOR CIRCULATION: Visualized distal cervical segments are both widely patent with antegrade flow. Petrous, cavernous, and supraclinoid segments patent without stenosis or other abnormality. A1 segments widely patent. Normal anterior communicating artery complex. Anterior cerebral arteries patent to their distal aspects without stenosis. No M1 stenosis or occlusion. Normal MCA bifurcations. Distal MCA branches well perfused and symmetric. POSTERIOR CIRCULATION: Dominant right vertebral artery widely patent to the vertebrobasilar junction. Right PICA patent and well perfused. Faint flow seen within the partially visualized distal left vertebral artery, with relatively absent flow within the left V3 segment. The left vertebral artery appears occluded as it courses into the cranial vault. Attenuated irregular flow seen within the left V4 segment distally, and remains grossly patent to the vertebrobasilar junction. Left PICA grossly patent at its origin. Basilar patent to its distal aspect without stenosis or visible abnormality. Right superior cerebral artery widely patent. Left SCA faintly visible and appears grossly patent on time-of-flight sequence, not seen on MIP reconstructions. Both PCAs primarily supplied via the basilar. PCAs appear well perfused to their distal aspects. No intracranial aneurysm. MRA NECK FINDINGS AORTIC ARCH: Visualized aortic arch normal in caliber with normal 3 vessel morphology. No hemodynamically significant stenosis seen about the origin of the great vessels. RIGHT CAROTID SYSTEM: Right CCA patent from its origin to the bifurcation without stenosis. Mild for age atheromatous irregularity about the right carotid bulb with no more than mild 20-25% stenosis by NASCET criteria. Right ICA patent distally without stenosis, evidence for dissection or  occlusion. LEFT CAROTID SYSTEM: Left common carotid artery widely patent from its origin to the bifurcation. Mild for age atheromatous irregularity about the left carotid bulb with no more than mild 20-25% by NASCET criteria. Left ICA otherwise patent without stenosis, evidence for dissection, or occlusion. Minimal irregularity about the mid cervical left ICA noted. VERTEBRAL ARTERIES: Both vertebral arteries arise from the subclavian arteries. Right vertebral artery likely dominant. There is a short-segment moderate approximate 50% stenosis involving the pre foraminal right V1 segment (series 1058, image 10). Otherwise, the right vertebral artery is widely patent within the neck without stenosis, evidence for dissection, or occlusion. On the left, the left vertebral artery appears occluded at its origin (series 18, image 32). Distally, the left ICA is markedly attenuated and diminutive, with near occlusion at its distal V2 segment. Some flow is seen within the left V3 segment, but is also markedly attenuated and irregular. Given the presence of acute posterior circulation infarcts, finding raises the possibility for an acute dissection. IMPRESSION: MRI HEAD IMPRESSION: 1. Patchy acute ischemic nonhemorrhagic infarcts involving the left cerebellum, cerebellar vermis, right temporoccipital region, and left midbrain/pons. 2. Otherwise normal brain MRI for age. MRA HEAD IMPRESSION: 1. Occlusion of the left vertebral artery with concern for acute dissection as below. Attenuated irregular flow within the left V4 segment distally may in part be retrograde in nature. Dominant right vertebral artery widely patent. 2. Poor visualization of the left SCA, which also may be partially occluded given the distribution of infarcts. 3. Widely patent anterior  circulation. MRA NECK IMPRESSION: 1. Partial occlusion of the left vertebral artery at its origin, with severely attenuated and irregular flow distally within the left V2 and V3  segments. Given the presence of acute posterior circulation infarcts, these findings raise the possibility for an acute arterial dissection. Correlation with dedicated CTA could be performed for further evaluation as warranted. 2. Short-segment moderate approximate 50% stenosis involving the pre foraminal right V1 segment. Otherwise wide patency of the dominant right vertebral artery. 3. Mild for age atheromatous change about the carotid bifurcations without significant stenosis. Otherwise wide patency of both carotid artery systems in the neck. Critical Value/emergent results were called by telephone at the time of interpretation on 05/19/2020 at 1:34 a.m. to provider Dr. Marily Memos, who verbally acknowledged these results. Electronically Signed   By: Rise Mu M.D.   On: 05/19/2020 01:56   MR BRAIN WO CONTRAST  Result Date: 05/19/2020 CLINICAL DATA:  Initial evaluation for neuro deficit, stroke suspected, vertigo. EXAM: MRI HEAD WITHOUT CONTRAST MRA HEAD WITHOUT CONTRAST MRA NECK WITHOUT AND WITH CONTRAST TECHNIQUE: Multiplanar, multiecho pulse sequences of the brain and surrounding structures were obtained without intravenous contrast. Angiographic images of the Circle of Willis were obtained using MRA technique without intravenous contrast. Angiographic images of the neck were obtained using MRA technique without and with intravenous contrast. Carotid stenosis measurements (when applicable) are obtained utilizing NASCET criteria, using the distal internal carotid diameter as the denominator. CONTRAST:  6.23mL GADAVIST GADOBUTROL 1 MMOL/ML IV SOLN COMPARISON:  Prior CT from 03/30/2018. FINDINGS: MRI HEAD FINDINGS Brain: Cerebral volume within normal limits for patient age. No significant cerebral white matter disease for age. Patchy multifocal areas of restricted diffusion seen involving the left cerebellum, most pronounced inferiorly. Minimal patchy involvement of the central cerebellar vermis.  Additional patchy small volume areas of restricted diffusion seen involving the paramedian right temporal occipital region. Mild diffusion abnormality also noted at the left midbrain/pons. No associated hemorrhage or mass effect. Otherwise, gray-white matter differentiation well maintained. No other areas of chronic cortical infarction. No foci of susceptibility artifact to suggest acute or chronic intracranial hemorrhage. No mass lesion, midline shift or mass effect. No hydrocephalus or extra-axial fluid collection. Pituitary gland suprasellar region normal. Midline structures intact. Vascular: Major intracranial vascular flow voids are maintained. Skull and upper cervical spine: Craniocervical junction within normal limits. Bone marrow signal intensity normal. No scalp soft tissue abnormality. Reversal of the normal upper cervical lordosis noted. Sinuses/Orbits: Globes and orbital soft tissues demonstrate no acute finding. Paranasal sinuses are clear. No mastoid effusion. Inner ear structures grossly normal. Other: None. MRA HEAD FINDINGS ANTERIOR CIRCULATION: Visualized distal cervical segments are both widely patent with antegrade flow. Petrous, cavernous, and supraclinoid segments patent without stenosis or other abnormality. A1 segments widely patent. Normal anterior communicating artery complex. Anterior cerebral arteries patent to their distal aspects without stenosis. No M1 stenosis or occlusion. Normal MCA bifurcations. Distal MCA branches well perfused and symmetric. POSTERIOR CIRCULATION: Dominant right vertebral artery widely patent to the vertebrobasilar junction. Right PICA patent and well perfused. Faint flow seen within the partially visualized distal left vertebral artery, with relatively absent flow within the left V3 segment. The left vertebral artery appears occluded as it courses into the cranial vault. Attenuated irregular flow seen within the left V4 segment distally, and remains grossly  patent to the vertebrobasilar junction. Left PICA grossly patent at its origin. Basilar patent to its distal aspect without stenosis or visible abnormality. Right superior cerebral artery widely patent.  Left SCA faintly visible and appears grossly patent on time-of-flight sequence, not seen on MIP reconstructions. Both PCAs primarily supplied via the basilar. PCAs appear well perfused to their distal aspects. No intracranial aneurysm. MRA NECK FINDINGS AORTIC ARCH: Visualized aortic arch normal in caliber with normal 3 vessel morphology. No hemodynamically significant stenosis seen about the origin of the great vessels. RIGHT CAROTID SYSTEM: Right CCA patent from its origin to the bifurcation without stenosis. Mild for age atheromatous irregularity about the right carotid bulb with no more than mild 20-25% stenosis by NASCET criteria. Right ICA patent distally without stenosis, evidence for dissection or occlusion. LEFT CAROTID SYSTEM: Left common carotid artery widely patent from its origin to the bifurcation. Mild for age atheromatous irregularity about the left carotid bulb with no more than mild 20-25% by NASCET criteria. Left ICA otherwise patent without stenosis, evidence for dissection, or occlusion. Minimal irregularity about the mid cervical left ICA noted. VERTEBRAL ARTERIES: Both vertebral arteries arise from the subclavian arteries. Right vertebral artery likely dominant. There is a short-segment moderate approximate 50% stenosis involving the pre foraminal right V1 segment (series 1058, image 10). Otherwise, the right vertebral artery is widely patent within the neck without stenosis, evidence for dissection, or occlusion. On the left, the left vertebral artery appears occluded at its origin (series 18, image 32). Distally, the left ICA is markedly attenuated and diminutive, with near occlusion at its distal V2 segment. Some flow is seen within the left V3 segment, but is also markedly attenuated and  irregular. Given the presence of acute posterior circulation infarcts, finding raises the possibility for an acute dissection. IMPRESSION: MRI HEAD IMPRESSION: 1. Patchy acute ischemic nonhemorrhagic infarcts involving the left cerebellum, cerebellar vermis, right temporoccipital region, and left midbrain/pons. 2. Otherwise normal brain MRI for age. MRA HEAD IMPRESSION: 1. Occlusion of the left vertebral artery with concern for acute dissection as below. Attenuated irregular flow within the left V4 segment distally may in part be retrograde in nature. Dominant right vertebral artery widely patent. 2. Poor visualization of the left SCA, which also may be partially occluded given the distribution of infarcts. 3. Widely patent anterior circulation. MRA NECK IMPRESSION: 1. 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 Given the presence of acute posterior circulation infarcts, these findings raise the possibility for an acute arterial dissection. Correlation with dedicated CTA could be performed for further evaluation as warranted. 2. Short-segment moderate approximate 50% stenosis involving the pre foraminal right V1 segment. Otherwise wide patency of the dominant right vertebral artery. 3. Mild for age atheromatous change about the carotid bifurcations without significant stenosis. Otherwise wide patency of both carotid artery systems in the neck. Critical Value/emergent results were called by telephone at the time of interpretation on 05/19/2020 at 1:34 a.m. to provider Dr. Marily Memos, who verbally acknowledged these results. Electronically Signed   By: Rise Mu M.D.   On: 05/19/2020 01:56   DG Chest Portable 1 View  Result Date: 05/18/2020 CLINICAL DATA:  Dizziness for several hours EXAM: PORTABLE CHEST 1 VIEW COMPARISON:  12/26/2019 CT FINDINGS: The heart size and mediastinal contours are within  normal limits. Both lungs are clear. The visualized skeletal structures are unremarkable. IMPRESSION: No active disease. Electronically Signed   By: Alcide Clever M.D.   On: 05/18/2020 22:22    EKG: Independently reviewed. Sinus rhythm, borderline T wave abnormalities in inferior and lateral leads.  Assessment/Plan Principal Problem:   Stroke [facility]) Active Problems:   Vertebral artery dissection (HCC)   Hypokalemia   HTN (hypertension)   Leukocytosis  Acute vertebrobasilar embolic strokes Left extracranial vertebral artery dissection Patient here for evaluation of vertigo and ataxia, symptom onset around 8 AM on 3/6. LKW on 3/5 evening. Brain MRI showing patchy acute ischemic nonhemorrhagic infarcts involving the left cerebellum, cerebellar vermis, right temporal occipital region, and left midbrain/pons. MRA head and neck showing findings concerning for acute extracranial vertebral artery dissection. -Telemetry monitoring -TTE ordered -Hemoglobin A1c, fasting lipid panel, TSH -Started on DAPT and statin by neurology -Goal SBP <160 -Frequent neurochecks -PT, OT, speech therapy. -Patient has passed bedside swallow screen. -I was called by emergency room physician to admit the patient to the floor. After seeing the patient, I spoke to neurologist Dr. Thomasena Edis who recommends admitting the patient to the ICU instead for close monitoring. I have spoken to Dr. Wynona Neat and patient has been accepted for transfer to the ICU, further management per PCCM team and neurology.  Hypokalemia: Likely due to home diuretic use. -Monitor potassium and magnesium levels, replenish as needed. Hold diuretic.  Borderline leukocytosis: Likely reactive. Chest x-ray not suggestive of pneumonia. -UA pending. Repeat CBC in a.m.  Hypertension -Goal SBP <160  DVT prophylaxis: SCDs Code Status: Patient wishes to be full code. Family Communication: Daughter at bedside. Disposition Plan: Status is:  Inpatient  Remains inpatient appropriate because:Inpatient level of care appropriate due to severity of illness   Dispo: The patient is from: Home              Anticipated d/c is to: Home              Patient currently is not medically stable to d/c.   Difficult to place patient No  Level of care: Level of care: Telemetry Medical   The medical decision making on this patient was of high complexity and the patient is at high risk for clinical deterioration, therefore this is a level 3 visit.  [name] MD [facility]  If 7PM-7AM, please contact night-coverage [email or web address]  05/19/2020, 3:04 AM

## 2020-05-19 NOTE — Evaluation (Signed)
Occupational Therapy Evaluation Patient Details Name: Jeanette Barnes MRN: [id number] DOB: 1955/04/17 Today's Date: 05/19/2020    History of Present Illness Jeanette Barnes is a 65 y.o. female with medical history significant of hypertension, hyperlipidemia presenting to the ED for evaluation of vertigo and ataxia. Fell with possibly hitting her head. MRI showing patchy acute ischemic nonhemorrhagic infarcts involving the left cerebellum, cerebellar vermis, right temporal occipital region, and left midbrain/pons.MRA head and neck showing findings concerning for acute extracranial vertebral artery dissection   Clinical Impression   This 65 yo female admitted with above presents to acute OT with PLOF of being totally independent and getting ready to start a new job this week per her report. Currently she is setup-mod A +2 for ADLs and Min A-Mod A +2 for mobility. She will benefit from acute OT with follow up on CIR.    Follow Up Recommendations  CIR;Supervision - Intermittent          Precautions / Restrictions Precautions Precautions: Fall Restrictions Weight Bearing Restrictions: No      Mobility Bed Mobility Overal bed mobility: Needs Assistance Bed Mobility: Sidelying to Sit   Sidelying to sit: Min assist            Transfers Overall transfer level: Needs assistance Equipment used: Rolling walker (2 wheeled);None Transfers: Sit to/from [employer] Sit to Stand: Mod assist;+2 physical assistance;+2 safety/equipment Stand pivot transfers: Mod assist;+2 physical assistance;+2 safety/equipment       General transfer comment: initial stand-pivot with mod assist +2 without device; pt with difficulty fully standing and with left lean; with RW mod assist +2 to stand and pivot BSC to recliner    Balance Overall balance assessment: Needs assistance Sitting-balance support: Feet supported Sitting balance-Leahy Scale: Poor Sitting balance - Comments: initially had  a posterior lean; also once she got to [facility] she had a left lateral lean   Standing balance support: Bilateral upper extremity supported Standing balance-Leahy Scale: Poor Standing balance comment: reliant on RW and external A of therapists                           ADL either performed or assessed with clinical judgement   ADL Overall ADL's : Needs assistance/impaired Eating/Feeding: Set up;Sitting Eating/Feeding Details (indicate cue type and reason): recliner, with increased time Grooming: Set up;Sitting Grooming Details (indicate cue type and reason): recliner, with increased time Upper Body Bathing: Minimal assistance;Sitting Upper Body Bathing Details (indicate cue type and reason): recliner, with increased time Lower Body Bathing: Maximal assistance Lower Body Bathing Details (indicate cue type and reason): Mod A +2 sit<>stand Upper Body Dressing : Moderate assistance;Sitting Upper Body Dressing Details (indicate cue type and reason): recliner, with increased time Lower Body Dressing: Maximal assistance Lower Body Dressing Details (indicate cue type and reason): Mod A +2 sit<>stand Toilet Transfer: Moderate assistance;+2 for physical assistance;+2 for safety/equipment;RW;Stand-pivot;BSC     Toileting - Clothing Manipulation Details (indicate cue type and reason): Can wipe front peri area while seated on BSC, needs +2 Mod A sit<>stand and stand pivot             Vision Baseline Vision/History: Wears glasses Wears Glasses: Distance only Patient Visual Report: Blurring of vision Vision Assessment?: Yes Eye Alignment: Within Functional Limits Alignment/Gaze Preference: Within Defined Limits Tracking/Visual Pursuits: Other (comment) (pt's left eye does not always want to track in conjunction with right eye) Saccades: Within functional limits Convergence: Within functional limits Visual Fields: No apparent  deficits Additional Comments: left eyelid droop; reported  that earlier she saw 2 clocks while supine in bed but once she sat up and we had her look right to see clock it was blurry but not two and she could tell the time            Pertinent Vitals/Pain Pain Assessment: No/denies pain     Hand Dominance Right   Extremity/Trunk Assessment Upper Extremity Assessment Upper Extremity Assessment: Defer to OT evaluation LUE Deficits / Details: Decreased coordination for finger<>nose with and without eyes closed LUE Coordination: decreased fine motor;decreased gross motor   Lower Extremity Assessment Lower Extremity Assessment: LLE deficits/detail LLE Deficits / Details: mild decreased coordination with heel to shin LLE Sensation: WNL LLE Coordination: decreased gross motor       Communication Communication Communication: No difficulties   Cognition Arousal/Alertness: Awake/alert Behavior During Therapy: Flat affect Overall Cognitive Status: Impaired/Different from baseline Area of Impairment: Safety/judgement;Awareness;Problem solving                         Safety/Judgement: Decreased awareness of safety;Decreased awareness of deficits Awareness: Emergent Problem Solving: Slow processing;Requires verbal cues;Requires tactile cues General Comments: At times slow to respond to questions. 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)        OT Problem List: Impaired balance (sitting and/or standing);Impaired vision/perception;Decreased coordination;Decreased cognition;Decreased safety awareness;Impaired UE functional use      OT Treatment/Interventions: Self-care/ADL training;DME and/or AE instruction;Patient/family education;Balance training;Therapeutic activities;Therapeutic exercise;Visual/perceptual remediation/compensation    OT Goals(Current goals can be found in the care plan section) Acute Rehab OT Goals Patient Stated Goal: to be able to work (was suppose to start a job in 2 days) OT Goal Formulation: With patient Time For Goal Achievement: 06/02/20 Potential to Achieve Goals: Good  OT Frequency: Min 2X/week   Barriers to D/C: Decreased caregiver support          Co-evaluation PT/OT/SLP Co-Evaluation/Treatment: Yes Reason for Co-Treatment: For patient/therapist safety;To address functional/ADL transfers PT goals addressed during session: Mobility/safety with mobility;Balance;Strengthening/ROM;Proper use of DME OT goals addressed during session: Strengthening/ROM;ADL's and self-care      AM-PAC OT "6 Clicks" Daily Activity     Outcome Measure Help from another person eating meals?: A Little Help from another person taking care of personal grooming?: A Little Help from another person toileting, which includes using toliet, bedpan, or urinal?: A Lot Help from another person bathing (including washing, rinsing, drying)?: A Lot Help from another person to put on and taking off regular upper body clothing?: A Lot Help from another person to put on and taking off regular lower body clothing?: A Lot 6 Click Score: 14   End of Session Equipment Utilized During Treatment: Gait belt;Rolling walker Nurse Communication: Mobility status (needs purewick replaced)  Activity  Tolerance: Patient tolerated treatment well Patient left: in chair;with call bell/phone within reach;with chair alarm set  OT  Visit Diagnosis: Unsteadiness on feet (R26.81);Other abnormalities of gait and mobility (R26.89);Muscle weakness (generalized) (M62.81);Ataxia, unspecified (R27.0);Low vision, both eyes (H54.2);Other symptoms and signs involving cognitive function;Hemiplegia and hemiparesis Hemiplegia - Right/Left: Left Hemiplegia - dominant/non-dominant: Non-Dominant Hemiplegia - caused by: Cerebral infarction                Time: 0922-1000 OT Time Calculation (min): 38 min Charges:  OT General Charges $OT Visit: 1 Visit OT Evaluation $OT Eval Moderate Complexity: 1 Mod OT Treatments $Self Care/Home Management : 8-22 mins  Ignacia Palma, OTR/L Acute [employer] Pager [phone] Office [phone]     Evette Georges 05/19/2020, 11:06 AM

## 2020-05-19 NOTE — Consult Note (Signed)
Neurology Consult H&P  Jeanette Barnes MR# [id number] 05/19/2020  CC: unsteadiness on waking 05/18/2020  History is obtained from: Patient, ED staff, chart  HPI: Jeanette Barnes is a 65 y.o. female right handed PMHx HTN, HLD with acute embolic stroke. She woke at 0800 on the morning of 05/18/2020 feeling lethargic, dizzy and unstable all day. In the evening, she went to bathroom and had vomiting fell to her left and hit her neck on the rubbish bin. There was no LOC but she was too weak to get up and called daughter who activated EMS. Her daughter found her lying on the left side.  LKW: evening of 05/17/2020 tpa given: No OSW IR Thrombectomy No Modified Rankin Scale: 0-Completely asymptomatic and back to baseline post- stroke NIHSS: 5 LOC Responsiveness 0 LOC Questions 0 LOC Commands 0 Horizontal eye movement 0 Visual field 0 Facial palsy 0 Motor arm - Right arm 0 Motor arm - Left arm 0 Motor leg - Right leg 0 Motor leg - Left leg 1 Limb ataxia 2 Sensory test 0 Language 1 Speech 1 - very mild Extinction and inattention 0  ROS: A complete ROS was performed and is negative except as noted in the HPI.   Past Medical History:  Diagnosis Date  . AAT (alpha-1-antitrypsin) deficiency (HCC)   . Anxiety   . Heart murmur    mitral valve prolapse  . Hypercholesteremia   . Hypertension   . Pulmonary nodule   . Vitamin D deficiency    Family History  Problem Relation Age of Onset  . Emphysema Father   . Hypertension Brother   . Kidney disease Brother    Social History:  reports that she has never smoked. She has never used smokeless tobacco. She reports that she does not drink alcohol and does not use drugs.  Prior to Admission medications   Medication Sig Start Date End Date Taking? Authorizing Provider  acetaminophen (TYLENOL) 500 MG tablet Take 1,000 mg by mouth every 6 (six) hours as needed for mild pain or headache.   Yes [provider]  albuterol (VENTOLIN HFA) 108 (90  Base) MCG/ACT inhaler Inhale 2 puffs into the lungs every 6 (six) hours as needed for wheezing or shortness of breath. 12/06/19  Yes Mannam, Praveen, MD  Biotin 1 MG CAPS Take 1 mg by mouth daily.   Yes [provider]  cholecalciferol (VITAMIN D) 400 units TABS tablet Take 400 Units by mouth.   Yes [provider]  hydrochlorothiazide (HYDRODIURIL) 25 MG tablet Take 25 mg by mouth daily. 10/12/16  Yes [provider]  naproxen sodium (ALEVE) 220 MG tablet Take 220 mg by mouth daily as needed (pain/headace).   Yes [provider]  OVER THE COUNTER MEDICATION Take 1 tablet by mouth daily. Stress relief gummy---olly brand   Yes [provider]  vitamin B-12 (CYANOCOBALAMIN) 1000 MCG tablet Take 1,000 mcg by mouth daily.   Yes [provider]  VITAMIN E PO Take 1 tablet by mouth as needed.   Yes [provider]    Exam: Current vital signs: BP (!) 156/77   Pulse 66   Temp 97.6 F (36.4 C) (Oral)   Resp 16   SpO2 98%   Physical Exam  Constitutional: Appears well-developed and well-nourished.  Psych: Affect appropriate to situation Eyes: No scleral injection HENT: No OP obstruction. Head: Normocephalic.  Cardiovascular: Normal rate and regular rhythm.  Respiratory: Effort normal, symmetric excursions bilaterally, no audible wheezing. GI: Soft.  No  distension. There is no tenderness.  Skin: WDI  Neuro: Mental Status: Patient is sleepy and in pain but awake and alert, oriented to person, place, month, year, and situation. Patient is able to give a clear and coherent history. Speech non-fluent, intact comprehension and mildly impaired repetition. There is mildly dysarthria - Impaired phonation. Visual Fields are full. Pupils are equal, round, and reactive to light. EOMI without ptosis or diploplia.  Facial sensation is symmetric to temperature Facial movement is symmetric.  Hearing is intact to voice. Uvula midline and  palate elevates symmetrically. Shoulder shrug is symmetric. Tongue is midline without atrophy or fasciculations.  Tone is normal. Bulk is normal. 4+/5 left lower extremity. Sensation is symmetric to light touch and temperature in the arms and legs. Deep Tendon Reflexes: 2+ and symmetric in the biceps and patellae. Toes are downgoing bilaterally. FNF and HKS are mild ataxia left leg>arm. Gait - Deferred  I have reviewed labs in epic and the pertinent results are:   Ref. Range 05/18/2020 21:19  Potassium Latest Ref Range: 3.5 - 5.1 mmol/L 2.7 (LL)    Ref. Range 05/18/2020 21:19  Glucose Latest Ref Range: 70 - 99 mg/dL 295 (H)    I have reviewed the images obtained: MRI brain showed moderate sized left cerebellum as well as acute ischemic strokes in the cerebellar vermis, right temporoccipital/PCA territory and left midbrain/pons. MRA head and neck showed occlusion of the left vertebral artery and attenuated distal L V4 flow which may be retrograde flow. The left SCA was not well visualized and which may suggest partial occlusion.   Assessment: Jeanette Barnes is a 65 y.o. female right handed PMHx HTN, HLD woke 05/18/2020 with left sided ataxia and unsteadiness fell and hit her neck on the left and found to have acute vertebrobasilar embolic strokes and given the fall, likely left vertebral artery dissection (non-dominant). It is difficult to determine extent of initial stroke as the dissection may be confounder and have contributed further to volume/number of strokes.   Imaging showed very good caliber dominant right vertebral artery in which case there may not be a role for intervention however recommend initial evaluation or for further surveillance.  Arterial dissection carries a high risk of causing further strokes and therapy should be started as soon as possible. The patient has mild to moderate size stroke with NIHSS 5 in which case dual antiplatelet therapy is warranted. Benefits of  preventing catastrophic stroke outweigh risk of bleeding. For extracranial vertebral artery dissection, the choice of antiplatelet or anticoagulation is based on provider and comorbid medical conditions. The patient has hypertension and hyperlipidemia, and antiplatelet may be beneficial and will start antiplatelet therapy now, and will reevaluate options further.  Impression:  Acute vertebrobasilar strokes. Cerebral edema. Left extracranial vertebral artery dissection. Trauma to soft tissue of left neck. Left lower extremity weakness - Mild. Mild ataxia in left upper>lower extremities. Aphasia. Mild dysarthria. NIHSS 5 Hypokalemia Mild hyperglycemia HLD Recent LDL - 164   Plan: - Would feel more comfortable admitting to ICU for closer observation of neurologic status. - Aspirin 324mg  now then 81mg  daily - Ordered. - Clopidogrel 300mg  loading dose then followed by 75mg  daily - Ordered. - Recommend TTE. - Recommend labs: HbA1c, lipid panel, TSH. - Start statin and adjust according to LDL. - Euglycemia - SBP <160  - Telemetry monitoring for arrhythmia. - Recommend bedside Swallow screen. - Recommend Stroke education. - Recommend PT/OT/SLP consult. - Avoid medications/OTCs that contain estrogen - Vessel monitoring and repeat imaging  in 3-6 months from onset - PPx: SCDs for now, senna/docusate, PPI. - Consider consulting Dr. Corliss Skains neuro IR for evaluation and further surveillance.  This patient is critically ill and at significant risk of neurological worsening, death and care requires constant monitoring of vital signs, hemodynamics,respiratory and cardiac monitoring, neurological assessment, discussion with family, other specialists and medical decision making of high complexity. I spent 73 minutes of neurocritical care time  in the care of  this patient. This was time spent independent of any time provided by nurse practitioner or PA.  Electronically signed by:  Marisue Humble,  MD Page: [phone] 05/19/2020, 1:29 AM

## 2020-05-19 NOTE — Consult Note (Signed)
NAME:  Jeanette Barnes, MRN:  [id number], DOB:  01/08/1956, LOS: 0 ADMISSION DATE:  05/18/2020, CONSULTATION DATE:  05/19/2020 REFERRING MD: Dr Loney Loh, CHIEF COMPLAINT:  Vertigo, Ataxia   Brief History:  Patient with a history of hypertension hyperlipidemia Woke up with a headache on 3/6, there was dizziness and she vomited. Did feel unsteady and had a fall-fall was not witnessed.  Was found on the floor, too weak to get up.  Family activated EMS Was not feeling unwell the previous day.  Evaluation so far reveals ischemic nonhemorrhagic infarct involving the left cerebellum, cerebellar vermis, right temporal occipital region and left midbrain/pons MRA revealing extracranial vertebral artery dissection  Past Medical History:   Past Medical History:  Diagnosis Date  . AAT (alpha-1-antitrypsin) deficiency (HCC)   . Anxiety   . Heart murmur    mitral valve prolapse  . Hypercholesteremia   . Hypertension   . Pulmonary nodule   . Vitamin D deficiency    Significant Hospital Events:    Consults:  Neurology Critical care  Procedures:  None  Significant Diagnostic Tests:  IMPRESSION: MRI HEAD IMPRESSION:  1. Patchy acute ischemic nonhemorrhagic infarcts involving the left cerebellum, cerebellar vermis, right temporoccipital region, and left midbrain/pons. 2. Otherwise normal brain MRI for age.  MRA HEAD IMPRESSION:  1. Occlusion of the left vertebral artery with concern for acute dissection as below. Attenuated irregular flow within the left V4 segment distally may in part be retrograde in nature. Dominant right vertebral artery widely patent. 2. Poor visualization of the left SCA, which also may be partially occluded given the distribution of infarcts. 3. Widely patent anterior circulation.  MRA NECK IMPRESSION:  1. Partial occlusion of the left vertebral artery at its origin, with severely attenuated and irregular flow distally within the left V2 and V3  segments. Given the presence of acute posterior circulation infarcts, these findings raise the possibility for an acute arterial dissection. Correlation with dedicated CTA could be performed for further evaluation as warranted. 2. Short-segment moderate approximate 50% stenosis involving the pre foraminal right V1 segment. Otherwise wide patency of the dominant right vertebral artery. 3. Mild for age atheromatous change about the carotid bifurcations without significant stenosis. Otherwise wide patency of both carotid artery systems in the neck.  Micro Data:  none  Antimicrobials:  none   Interim History / Subjective:  Awake alert interactive Complain of some neck discomfort for which she received Tylenol with good effect  Objective   Blood pressure (!) 156/77, pulse 66, temperature 97.6 F (36.4 C), temperature source Oral, resp. rate 16, SpO2 98 %.        Intake/Output Summary (Last 24 hours) at 05/19/2020 0542 Last data filed at 05/19/2020 0230 Gross per 24 hour  Intake 150 ml  Output --  Net 150 ml   There were no vitals filed for this visit.  Examination: General: Elderly lady, does not appear to be in distress HENT: Moist oral mucosa Lungs: Clear breath sounds bilaterally Cardiovascular: S1-S2 appreciated with no murmur Abdomen: Soft, bowel sounds appreciated Extremities: No clubbing, no edema Neuro: Moving all extremities with good strength GU:   Resolved Hospital Problem list     Assessment & Plan:  Acute vertebrobasilar embolic strokes Left extracranial vertebral artery dissection -Neurology has clearly outlined management goals and parameters -Dual antiplatelets -Statin therapy -Neurochecks -Goal systolic blood pressure of less than 160  Hypertension -Goal blood pressure currently , systolic less than 160 with close monitoring  Requires ICU neurology monitoring  Consults to neuro IR-pending  Echocardiogram ordered  History of alpha 1  antitrypsin deficiency -Did not see levels, phenotype SS -Normal PFTs -Never smoker   Best practice (evaluated daily)  Diet: address following swallow eval Pain/Anxiety/Delirium protocol (if indicated): tylenol VAP protocol (if indicated): n/a DVT prophylaxis:SCD GI prophylaxis: PPI Glucose control: monitor Mobility: Bedrest Disposition:ICU  Goals of Care:  Last date of multidisciplinary goals of care discussion: Family and staff present:  Summary of discussion:  Follow up goals of care discussion due:  Code Status: full code  Labs   CBC: Recent Labs  Lab 05/18/20 2119 05/19/20 0349  WBC 11.6* 10.8*  NEUTROABS 9.6*  --   HGB 12.7 12.4  HCT 38.6 36.4  MCV 90.2 88.1  PLT 206 215    Basic Metabolic Panel: Recent Labs  Lab 05/18/20 2119  NA 137  K 2.7*  CL 101  CO2 25  GLUCOSE 143*  BUN 12  CREATININE 0.65  CALCIUM 9.2  MG 1.8   GFR: CrCl cannot be calculated (Unknown ideal weight.). Recent Labs  Lab 05/18/20 2119 05/19/20 0349  WBC 11.6* 10.8*    Liver Function Tests: No results for input(s): AST, ALT, ALKPHOS, BILITOT, PROT, ALBUMIN in the last 168 hours. No results for input(s): LIPASE, AMYLASE in the last 168 hours. No results for input(s): AMMONIA in the last 168 hours.  ABG No results found for: PHART, PCO2ART, PO2ART, HCO3, TCO2, ACIDBASEDEF, O2SAT   Coagulation Profile: No results for input(s): INR, PROTIME in the last 168 hours.  Cardiac Enzymes: No results for input(s): CKTOTAL, CKMB, CKMBINDEX, TROPONINI in the last 168 hours.  HbA1C: No results found for: HGBA1C  CBG: No results for input(s): GLUCAP in the last 168 hours.  Review of Systems:     Past Medical History:  She,  has a past medical history of AAT (alpha-1-antitrypsin) deficiency (HCC), Anxiety, Heart murmur, Hypercholesteremia, Hypertension, Pulmonary nodule, and Vitamin D deficiency.   Surgical History:   Past Surgical History:  Procedure Laterality Date  .  ABDOMINAL HYSTERECTOMY    . BREAST BIOPSY    . BREAST EXCISIONAL BIOPSY Bilateral 1990's   2 Left, 1 Right, all benign     Social History:   reports that she has never smoked. She has never used smokeless tobacco. She reports that she does not drink alcohol and does not use drugs.   Family History:  Her family history includes Emphysema in her father; Hypertension in her brother; Kidney disease in her brother.   Allergies No Known Allergies   The patient is critically ill with multiple organ systems failure and requires high complexity decision making for assessment and support, frequent evaluation and titration of therapies, application of advanced monitoring technologies and extensive interpretation of multiple databases. Critical Care Time devoted to patient care services described in this note independent of APP/resident time (if applicable)  is 30 minutes.   Virl Diamond MD [facility] Personal pager: [phone] If unanswered, please page CCM On-call: [phone]

## 2020-05-19 NOTE — Progress Notes (Signed)
  Echocardiogram 2D Echocardiogram has been performed.  Jeanette Barnes 05/19/2020, 2:41 PM

## 2020-05-19 NOTE — ED Notes (Signed)
Pt on bedpan.

## 2020-05-19 NOTE — ED Provider Notes (Signed)
[facility] [facility] Provider Note   CSN: [id number] Arrival date & time: 05/18/20  2051     History No chief complaint on file.   Jeanette Barnes is a 65 y.o. female.  Presenting to ER with concern for dizziness, lethargy.  Patient reports this morning she woke up around 830 and noted feeling dizzy and lightheaded.  Also having mild to moderate headache.  States throughout the day dizziness seems to have been relatively constant.  Feels very unsteady on her feet.  Also feels like her speech is slower.  While she was walking at home, she fell to the ground.  States she may have hit her head but believes it was a very minor impact.  Did not lose consciousness.  History obtained from patient, daughter at bedside.  HPI     Past Medical History:  Diagnosis Date  . AAT (alpha-1-antitrypsin) deficiency (HCC)   . Anxiety   . Heart murmur    mitral valve prolapse  . Hypercholesteremia   . Hypertension   . Pulmonary nodule   . Vitamin D deficiency     Patient Active Problem List   Diagnosis Date Noted  . Stroke (HCC) 05/19/2020  . Vertebral artery dissection (HCC) 05/19/2020  . Hypokalemia 05/19/2020  . HTN (hypertension) 05/19/2020  . Leukocytosis 05/19/2020  . Atypical chest pain 05/27/2017    Past Surgical History:  Procedure Laterality Date  . ABDOMINAL HYSTERECTOMY    . BREAST BIOPSY    . BREAST EXCISIONAL BIOPSY Bilateral 1990's   2 Left, 1 Right, all benign     OB History   No obstetric history on file.     Family History  Problem Relation Age of Onset  . Emphysema Father   . Hypertension Brother   . Kidney disease Brother     Social History   Tobacco Use  . Smoking status: Never Smoker  . Smokeless tobacco: Never Used  Vaping Use  . Vaping Use: Never used  Substance Use Topics  . Alcohol use: No  . Drug use: No    Home Medications Prior to Admission medications   Medication Sig Start Date End Date Taking? Authorizing Provider   acetaminophen (TYLENOL) 500 MG tablet Take 1,000 mg by mouth every 6 (six) hours as needed for mild pain or headache.   Yes [provider]  albuterol (VENTOLIN HFA) 108 (90 Base) MCG/ACT inhaler Inhale 2 puffs into the lungs every 6 (six) hours as needed for wheezing or shortness of breath. 12/06/19  Yes Mannam, Praveen, MD  Biotin 1 MG CAPS Take 1 mg by mouth daily.   Yes [provider]  cholecalciferol (VITAMIN D) 400 units TABS tablet Take 400 Units by mouth.   Yes [provider]  hydrochlorothiazide (HYDRODIURIL) 25 MG tablet Take 25 mg by mouth daily. 10/12/16  Yes [provider]  naproxen sodium (ALEVE) 220 MG tablet Take 220 mg by mouth daily as needed (pain/headace).   Yes [provider]  OVER THE COUNTER MEDICATION Take 1 tablet by mouth daily. Stress relief gummy---olly brand   Yes [provider]  vitamin B-12 (CYANOCOBALAMIN) 1000 MCG tablet Take 1,000 mcg by mouth daily.   Yes [provider]  VITAMIN E PO Take 1 tablet by mouth as needed.   Yes [provider]    Allergies    Patient has no known allergies.  Review of Systems   Review of Systems  Constitutional: Negative for chills and fever.  HENT:  Negative for ear pain and sore throat.   Eyes: Negative for pain and visual disturbance.  Respiratory: Negative for cough and shortness of breath.   Cardiovascular: Negative for chest pain and palpitations.  Gastrointestinal: Negative for abdominal pain and vomiting.  Genitourinary: Negative for dysuria and hematuria.  Musculoskeletal: Negative for arthralgias and back pain.  Skin: Negative for color change and rash.  Neurological: Positive for dizziness, speech difficulty and headaches. Negative for seizures, syncope and weakness.  All other systems reviewed and are negative.   Physical Exam Updated Vital Signs BP (!) 134/54 (BP Location: Right Arm)   Pulse 64   Temp 98 F (36.7 C) (Oral)   Resp  (!) 9   SpO2 100%   Physical Exam Vitals and nursing note reviewed.  Constitutional:      General: She is not in acute distress.    Appearance: She is well-developed and well-nourished.  HENT:     Head: Normocephalic and atraumatic.  Eyes:     Conjunctiva/sclera: Conjunctivae normal.  Cardiovascular:     Rate and Rhythm: Normal rate and regular rhythm.     Heart sounds: No murmur heard.   Pulmonary:     Effort: Pulmonary effort is normal. No respiratory distress.     Breath sounds: Normal breath sounds.  Abdominal:     Palpations: Abdomen is soft.     Tenderness: There is no abdominal tenderness.  Musculoskeletal:        General: No edema.     Cervical back: Neck supple.  Skin:    General: Skin is warm and dry.  Neurological:     Mental Status: She is alert.     Comments: AAOx3 Speech is somewhat dysarthric though patient does demonstrate receptive and expressive comprehension in conversation CN 2-12 intact, testing of extraocular movement, noted bilateral horizontal nystagmus, visual fields intact 5/5 strength in b/l UE and LE Sensation to light touch intact in b/l UE and LE Abnormal FNF, abnormal heel to shin testing   Psychiatric:        Mood and Affect: Mood and affect normal.     ED Results / Procedures / Treatments   Labs (all labs ordered are listed, but only abnormal results are displayed) Labs Reviewed  CBC WITH DIFFERENTIAL/PLATELET - Abnormal; Notable for the following components:      Result Value   WBC 11.6 (*)    Neutro Abs 9.6 (*)    Abs Immature Granulocytes 0.08 (*)    All other components within normal limits  BASIC METABOLIC PANEL - Abnormal; Notable for the following components:   Potassium 2.7 (*)    Glucose, Bld 143 (*)    All other components within normal limits  URINALYSIS, ROUTINE W REFLEX MICROSCOPIC - Abnormal; Notable for the following components:   Ketones, ur 20 (*)    All other components within normal limits  SALICYLATE LEVEL  - Abnormal; Notable for the following components:   Salicylate Lvl <7.0 (*)    All other components within normal limits  ACETAMINOPHEN LEVEL - Abnormal; Notable for the following components:   Acetaminophen (Tylenol), Serum <10 (*)    All other components within normal limits  LIPID PANEL - Abnormal; Notable for the following components:   Cholesterol 237 (*)    LDL Cholesterol 160 (*)    All other components within normal limits  CBC - Abnormal; Notable for the following components:   WBC 10.8 (*)    All other components within normal limits  RESP  PANEL BY RT-PCR (FLU A&B, COVID) ARPGX2  MRSA PCR SCREENING  ETHANOL  RAPID URINE DRUG SCREEN, HOSP PERFORMED  MAGNESIUM  HIV ANTIBODY (ROUTINE TESTING W REFLEX)  HEMOGLOBIN A1C  TSH  GLUCOSE, CAPILLARY  TROPONIN I (HIGH SENSITIVITY)  TROPONIN I (HIGH SENSITIVITY)    EKG EKG Interpretation  Date/Time:  Sunday May 18 2020 20:57:06 EST Ventricular Rate:  56 PR Interval:    QRS Duration: 85 QT Interval:  459 QTC Calculation: 443 R Axis:   29 Text Interpretation: Sinus rhythm Borderline T abnormalities, inferior leads decreased amplitude of t waves since 2019 Confirmed by Marily Memos [phone]) on 05/18/2020 11:37:37 PM Also confirmed by Marily Memos [phone]), editor Murphy, LaVerne ([id number])  on 05/19/2020 8:53:21 AM   Radiology MR ANGIO HEAD WO CONTRAST  Result Date: 05/19/2020 CLINICAL DATA:  Initial evaluation for neuro deficit, stroke suspected, vertigo. EXAM: MRI HEAD WITHOUT CONTRAST MRA HEAD WITHOUT CONTRAST MRA NECK WITHOUT AND WITH CONTRAST TECHNIQUE: Multiplanar, multiecho pulse sequences of the brain and surrounding structures were obtained without intravenous contrast. Angiographic images of the Circle of Willis were obtained using MRA technique without intravenous contrast. Angiographic images of the neck were obtained using MRA technique without and with intravenous contrast. Carotid stenosis measurements (when  applicable) are obtained utilizing NASCET criteria, using the distal internal carotid diameter as the denominator. CONTRAST:  6.27mL GADAVIST GADOBUTROL 1 MMOL/ML IV SOLN COMPARISON:  Prior CT from 03/30/2018. FINDINGS: MRI HEAD FINDINGS Brain: Cerebral volume within normal limits for patient age. No significant cerebral white matter disease for age. Patchy multifocal areas of restricted diffusion seen involving the left cerebellum, most pronounced inferiorly. Minimal patchy involvement of the central cerebellar vermis. Additional patchy small volume areas of restricted diffusion seen involving the paramedian right temporal occipital region. Mild diffusion abnormality also noted at the left midbrain/pons. No associated hemorrhage or mass effect. Otherwise, gray-white matter differentiation well maintained. No other areas of chronic cortical infarction. No foci of susceptibility artifact to suggest acute or chronic intracranial hemorrhage. No mass lesion, midline shift or mass effect. No hydrocephalus or extra-axial fluid collection. Pituitary gland suprasellar region normal. Midline structures intact. Vascular: Major intracranial vascular flow voids are maintained. Skull and upper cervical spine: Craniocervical junction within normal limits. Bone marrow signal intensity normal. No scalp soft tissue abnormality. Reversal of the normal upper cervical lordosis noted. Sinuses/Orbits: Globes and orbital soft tissues demonstrate no acute finding. Paranasal sinuses are clear. No mastoid effusion. Inner ear structures grossly normal. Other: None. MRA HEAD FINDINGS ANTERIOR CIRCULATION: Visualized distal cervical segments are both widely patent with antegrade flow. Petrous, cavernous, and supraclinoid segments patent without stenosis or other abnormality. A1 segments widely patent. Normal anterior communicating artery complex. Anterior cerebral arteries patent to their distal aspects without stenosis. No M1 stenosis or  occlusion. Normal MCA bifurcations. Distal MCA branches well perfused and symmetric. POSTERIOR CIRCULATION: Dominant right vertebral artery widely patent to the vertebrobasilar junction. Right PICA patent and well perfused. Faint flow seen within the partially visualized distal left vertebral artery, with relatively absent flow within the left V3 segment. The left vertebral artery appears occluded as it courses into the cranial vault. Attenuated irregular flow seen within the left V4 segment distally, and remains grossly patent to the vertebrobasilar junction. Left PICA grossly patent at its origin. Basilar patent to its distal aspect without stenosis or visible abnormality. Right superior cerebral artery widely patent. Left SCA faintly visible and appears grossly patent on time-of-flight sequence, not seen on MIP reconstructions. Both PCAs primarily  supplied via the basilar. PCAs appear well perfused to their distal aspects. No intracranial aneurysm. MRA NECK FINDINGS AORTIC ARCH: Visualized aortic arch normal in caliber with normal 3 vessel morphology. No hemodynamically significant stenosis seen about the origin of the great vessels. RIGHT CAROTID SYSTEM: Right CCA patent from its origin to the bifurcation without stenosis. Mild for age atheromatous irregularity about the right carotid bulb with no more than mild 20-25% stenosis by NASCET criteria. Right ICA patent distally without stenosis, evidence for dissection or occlusion. LEFT CAROTID SYSTEM: Left common carotid artery widely patent from its origin to the bifurcation. Mild for age atheromatous irregularity about the left carotid bulb with no more than mild 20-25% by NASCET criteria. Left ICA otherwise patent without stenosis, evidence for dissection, or occlusion. Minimal irregularity about the mid cervical left ICA noted. VERTEBRAL ARTERIES: Both vertebral arteries arise from the subclavian arteries. Right vertebral artery likely dominant. There is a  short-segment moderate approximate 50% stenosis involving the pre foraminal right V1 segment (series 1058, image 10). Otherwise, the right vertebral artery is widely patent within the neck without stenosis, evidence for dissection, or occlusion. On the left, the left vertebral artery appears occluded at its origin (series 18, image 32). Distally, the left ICA is markedly attenuated and diminutive, with near occlusion at its distal V2 segment. Some flow is seen within the left V3 segment, but is also markedly attenuated and irregular. Given the presence of acute posterior circulation infarcts, finding raises the possibility for an acute dissection. IMPRESSION: MRI HEAD IMPRESSION: 1. Patchy acute ischemic nonhemorrhagic infarcts involving the left cerebellum, cerebellar vermis, right temporoccipital region, and left midbrain/pons. 2. Otherwise normal brain MRI for age. MRA HEAD IMPRESSION: 1. Occlusion of the left vertebral artery with concern for acute dissection as below. Attenuated irregular flow within the left V4 segment distally may in part be retrograde in nature. Dominant right vertebral artery widely patent. 2. Poor visualization of the left SCA, which also may be partially occluded given the distribution of infarcts. 3. Widely patent anterior circulation. MRA NECK IMPRESSION: 1. 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 Given the presence of acute posterior circulation infarcts, these findings raise the possibility for an acute arterial dissection. Correlation with dedicated CTA could be performed for further evaluation as warranted. 2. Short-segment moderate approximate 50% stenosis involving the pre foraminal right V1 segment. Otherwise wide patency of the dominant right vertebral artery. 3. Mild for age atheromatous change about the carotid bifurcations without significant stenosis. Otherwise wide  patency of both carotid artery systems in the neck. Critical Value/emergent results were called by telephone at the time of interpretation on 05/19/2020 at 1:34 a.m. to provider Dr. Marily Memos, who verbally acknowledged these results. Electronically Signed   By: Rise Mu M.D.   On: 05/19/2020 01:56   MR ANGIO NECK W WO CONTRAST  Result Date: 05/19/2020 CLINICAL DATA:  Initial evaluation for neuro deficit, stroke suspected, vertigo. EXAM: MRI HEAD WITHOUT CONTRAST MRA HEAD WITHOUT CONTRAST MRA NECK WITHOUT AND WITH CONTRAST TECHNIQUE: Multiplanar, multiecho pulse sequences of the brain and surrounding structures were obtained without intravenous contrast. Angiographic images of the Circle of Willis were obtained using MRA technique without intravenous contrast. Angiographic images of the neck were obtained using MRA technique without and with intravenous contrast. Carotid stenosis measurements (when applicable) are obtained utilizing NASCET criteria, using the distal internal carotid diameter as the denominator.  CONTRAST:  6.925mL GADAVIST GADOBUTROL 1 MMOL/ML IV SOLN COMPARISON:  Prior CT from 03/30/2018. FINDINGS: MRI HEAD FINDINGS Brain: Cerebral volume within normal limits for patient age. No significant cerebral white matter disease for age. Patchy multifocal areas of restricted diffusion seen involving the left cerebellum, most pronounced inferiorly. Minimal patchy involvement of the central cerebellar vermis. Additional patchy small volume areas of restricted diffusion seen involving the paramedian right temporal occipital region. Mild diffusion abnormality also noted at the left midbrain/pons. No associated hemorrhage or mass effect. Otherwise, gray-white matter differentiation well maintained. No other areas of chronic cortical infarction. No foci of susceptibility artifact to suggest acute or chronic intracranial hemorrhage. No mass lesion, midline shift or mass effect. No hydrocephalus or  extra-axial fluid collection. Pituitary gland suprasellar region normal. Midline structures intact. Vascular: Major intracranial vascular flow voids are maintained. Skull and upper cervical spine: Craniocervical junction within normal limits. Bone marrow signal intensity normal. No scalp soft tissue abnormality. Reversal of the normal upper cervical lordosis noted. Sinuses/Orbits: Globes and orbital soft tissues demonstrate no acute finding. Paranasal sinuses are clear. No mastoid effusion. Inner ear structures grossly normal. Other: None. MRA HEAD FINDINGS ANTERIOR CIRCULATION: Visualized distal cervical segments are both widely patent with antegrade flow. Petrous, cavernous, and supraclinoid segments patent without stenosis or other abnormality. A1 segments widely patent. Normal anterior communicating artery complex. Anterior cerebral arteries patent to their distal aspects without stenosis. No M1 stenosis or occlusion. Normal MCA bifurcations. Distal MCA branches well perfused and symmetric. POSTERIOR CIRCULATION: Dominant right vertebral artery widely patent to the vertebrobasilar junction. Right PICA patent and well perfused. Faint flow seen within the partially visualized distal left vertebral artery, with relatively absent flow within the left V3 segment. The left vertebral artery appears occluded as it courses into the cranial vault. Attenuated irregular flow seen within the left V4 segment distally, and remains grossly patent to the vertebrobasilar junction. Left PICA grossly patent at its origin. Basilar patent to its distal aspect without stenosis or visible abnormality. Right superior cerebral artery widely patent. Left SCA faintly visible and appears grossly patent on time-of-flight sequence, not seen on MIP reconstructions. Both PCAs primarily supplied via the basilar. PCAs appear well perfused to their distal aspects. No intracranial aneurysm. MRA NECK FINDINGS AORTIC ARCH: Visualized aortic arch  normal in caliber with normal 3 vessel morphology. No hemodynamically significant stenosis seen about the origin of the great vessels. RIGHT CAROTID SYSTEM: Right CCA patent from its origin to the bifurcation without stenosis. Mild for age atheromatous irregularity about the right carotid bulb with no more than mild 20-25% stenosis by NASCET criteria. Right ICA patent distally without stenosis, evidence for dissection or occlusion. LEFT CAROTID SYSTEM: Left common carotid artery widely patent from its origin to the bifurcation. Mild for age atheromatous irregularity about the left carotid bulb with no more than mild 20-25% by NASCET criteria. Left ICA otherwise patent without stenosis, evidence for dissection, or occlusion. Minimal irregularity about the mid cervical left ICA noted. VERTEBRAL ARTERIES: Both vertebral arteries arise from the subclavian arteries. Right vertebral artery likely dominant. There is a short-segment moderate approximate 50% stenosis involving the pre foraminal right V1 segment (series 1058, image 10). Otherwise, the right vertebral artery is widely patent within the neck without stenosis, evidence for dissection, or occlusion. On the left, the left vertebral artery appears occluded at its origin (series 18, image 32). Distally, the left ICA is markedly attenuated and diminutive, with near occlusion at its distal V2 segment. Some flow  is seen within the left V3 segment, but is also markedly attenuated and irregular. Given the presence of acute posterior circulation infarcts, finding raises the possibility for an acute dissection. IMPRESSION: MRI HEAD IMPRESSION: 1. Patchy acute ischemic nonhemorrhagic infarcts involving the left cerebellum, cerebellar vermis, right temporoccipital region, and left midbrain/pons. 2. Otherwise normal brain MRI for age. MRA HEAD IMPRESSION: 1. Occlusion of the left vertebral artery with concern for acute dissection as below. Attenuated irregular flow within the  left V4 segment distally may in part be retrograde in nature. Dominant right vertebral artery widely patent. 2. Poor visualization of the left SCA, which also may be partially occluded given the distribution of infarcts. 3. Widely patent anterior circulation. MRA NECK IMPRESSION: 1. 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 Given the presence of acute posterior circulation infarcts, these findings raise the possibility for an acute arterial dissection. Correlation with dedicated CTA could be performed for further evaluation as warranted. 2. Short-segment moderate approximate 50% stenosis involving the pre foraminal right V1 segment. Otherwise wide patency of the dominant right vertebral artery. 3. Mild for age atheromatous change about the carotid bifurcations without significant stenosis. Otherwise wide patency of both carotid artery systems in the neck. Critical Value/emergent results were called by telephone at the time of interpretation on 05/19/2020 at 1:34 a.m. to provider Dr. Marily Memos, who verbally acknowledged these results. Electronically Signed   By: Rise Mu M.D.   On: 05/19/2020 01:56   MR BRAIN WO CONTRAST  Result Date: 05/19/2020 CLINICAL DATA:  Initial evaluation for neuro deficit, stroke suspected, vertigo. EXAM: MRI HEAD WITHOUT CONTRAST MRA HEAD WITHOUT CONTRAST MRA NECK WITHOUT AND WITH CONTRAST TECHNIQUE: Multiplanar, multiecho pulse sequences of the brain and surrounding structures were obtained without intravenous contrast. Angiographic images of the Circle of Willis were obtained using MRA technique without intravenous contrast. Angiographic images of the neck were obtained using MRA technique without and with intravenous contrast. Carotid stenosis measurements (when applicable) are obtained utilizing NASCET criteria, using the distal internal carotid diameter as the  denominator. CONTRAST:  6.71mL GADAVIST GADOBUTROL 1 MMOL/ML IV SOLN COMPARISON:  Prior CT from 03/30/2018. FINDINGS: MRI HEAD FINDINGS Brain: Cerebral volume within normal limits for patient age. No significant cerebral white matter disease for age. Patchy multifocal areas of restricted diffusion seen involving the left cerebellum, most pronounced inferiorly. Minimal patchy involvement of the central cerebellar vermis. Additional patchy small volume areas of restricted diffusion seen involving the paramedian right temporal occipital region. Mild diffusion abnormality also noted at the left midbrain/pons. No associated hemorrhage or mass effect. Otherwise, gray-white matter differentiation well maintained. No other areas of chronic cortical infarction. No foci of susceptibility artifact to suggest acute or chronic intracranial hemorrhage. No mass lesion, midline shift or mass effect. No hydrocephalus or extra-axial fluid collection. Pituitary gland suprasellar region normal. Midline structures intact. Vascular: Major intracranial vascular flow voids are maintained. Skull and upper cervical spine: Craniocervical junction within normal limits. Bone marrow signal intensity normal. No scalp soft tissue abnormality. Reversal of the normal upper cervical lordosis noted. Sinuses/Orbits: Globes and orbital soft tissues demonstrate no acute finding. Paranasal sinuses are clear. No mastoid effusion. Inner ear structures grossly normal. Other: None. MRA HEAD FINDINGS ANTERIOR CIRCULATION: Visualized distal cervical segments are both widely patent with antegrade flow. Petrous, cavernous, and supraclinoid segments patent without stenosis or other abnormality. A1 segments widely patent. Normal anterior communicating artery  complex. Anterior cerebral arteries patent to their distal aspects without stenosis. No M1 stenosis or occlusion. Normal MCA bifurcations. Distal MCA branches well perfused and symmetric. POSTERIOR CIRCULATION:  Dominant right vertebral artery widely patent to the vertebrobasilar junction. Right PICA patent and well perfused. Faint flow seen within the partially visualized distal left vertebral artery, with relatively absent flow within the left V3 segment. The left vertebral artery appears occluded as it courses into the cranial vault. Attenuated irregular flow seen within the left V4 segment distally, and remains grossly patent to the vertebrobasilar junction. Left PICA grossly patent at its origin. Basilar patent to its distal aspect without stenosis or visible abnormality. Right superior cerebral artery widely patent. Left SCA faintly visible and appears grossly patent on time-of-flight sequence, not seen on MIP reconstructions. Both PCAs primarily supplied via the basilar. PCAs appear well perfused to their distal aspects. No intracranial aneurysm. MRA NECK FINDINGS AORTIC ARCH: Visualized aortic arch normal in caliber with normal 3 vessel morphology. No hemodynamically significant stenosis seen about the origin of the great vessels. RIGHT CAROTID SYSTEM: Right CCA patent from its origin to the bifurcation without stenosis. Mild for age atheromatous irregularity about the right carotid bulb with no more than mild 20-25% stenosis by NASCET criteria. Right ICA patent distally without stenosis, evidence for dissection or occlusion. LEFT CAROTID SYSTEM: Left common carotid artery widely patent from its origin to the bifurcation. Mild for age atheromatous irregularity about the left carotid bulb with no more than mild 20-25% by NASCET criteria. Left ICA otherwise patent without stenosis, evidence for dissection, or occlusion. Minimal irregularity about the mid cervical left ICA noted. VERTEBRAL ARTERIES: Both vertebral arteries arise from the subclavian arteries. Right vertebral artery likely dominant. There is a short-segment moderate approximate 50% stenosis involving the pre foraminal right V1 segment (series 1058, image  10). Otherwise, the right vertebral artery is widely patent within the neck without stenosis, evidence for dissection, or occlusion. On the left, the left vertebral artery appears occluded at its origin (series 18, image 32). Distally, the left ICA is markedly attenuated and diminutive, with near occlusion at its distal V2 segment. Some flow is seen within the left V3 segment, but is also markedly attenuated and irregular. Given the presence of acute posterior circulation infarcts, finding raises the possibility for an acute dissection. IMPRESSION: MRI HEAD IMPRESSION: 1. Patchy acute ischemic nonhemorrhagic infarcts involving the left cerebellum, cerebellar vermis, right temporoccipital region, and left midbrain/pons. 2. Otherwise normal brain MRI for age. MRA HEAD IMPRESSION: 1. Occlusion of the left vertebral artery with concern for acute dissection as below. Attenuated irregular flow within the left V4 segment distally may in part be retrograde in nature. Dominant right vertebral artery widely patent. 2. Poor visualization of the left SCA, which also may be partially occluded given the distribution of infarcts. 3. Widely patent anterior circulation. MRA NECK IMPRESSION: 1. 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 Given the presence of acute posterior circulation infarcts, these findings raise the possibility for an acute arterial dissection. Correlation with dedicated CTA could be performed for further evaluation as warranted. 2. Short-segment moderate approximate 50% stenosis involving the pre foraminal right V1 segment. Otherwise wide patency of the dominant right vertebral artery. 3. Mild for age atheromatous change about the carotid bifurcations without significant stenosis. Otherwise wide patency of both carotid artery systems in the neck. Critical Value/emergent results were called by  telephone at  the time of interpretation on 05/19/2020 at 1:34 a.m. to provider Dr. Marily Memos, who verbally acknowledged these results. Electronically Signed   By: Rise Mu M.D.   On: 05/19/2020 01:56   DG Chest Portable 1 View  Result Date: 05/18/2020 CLINICAL DATA:  Dizziness for several hours EXAM: PORTABLE CHEST 1 VIEW COMPARISON:  12/26/2019 CT FINDINGS: The heart size and mediastinal contours are within normal limits. Both lungs are clear. The visualized skeletal structures are unremarkable. IMPRESSION: No active disease. Electronically Signed   By: Alcide Clever M.D.   On: 05/18/2020 22:22    Procedures Procedures   Medications Ordered in ED Medications  potassium chloride SA (KLOR-CON) CR tablet 40 mEq (40 mEq Oral Not Given 05/19/20 0410)  ondansetron (ZOFRAN) injection 4 mg (has no administration in time range)  vitamin B-12 (CYANOCOBALAMIN) tablet 1,000 mcg (1,000 mcg Oral Not Given 05/19/20 1015)  cholecalciferol (VITAMIN D3) tablet 400 Units (400 Units Oral Not Given 05/19/20 1042)   stroke: mapping our early stages of recovery book (0 each Does not apply Hold 05/19/20 0412)  acetaminophen (TYLENOL) tablet 650 mg (650 mg Oral Given 05/19/20 0358)    Or  acetaminophen (TYLENOL) 160 MG/5ML solution 650 mg ( Per Tube See Alternative 05/19/20 0358)    Or  acetaminophen (TYLENOL) suppository 650 mg ( Rectal See Alternative 05/19/20 0358)  aspirin EC tablet 81 mg (has no administration in time range)  clopidogrel (PLAVIX) tablet 75 mg (has no administration in time range)  docusate sodium (COLACE) capsule 100 mg (has no administration in time range)  polyethylene glycol (MIRALAX / GLYCOLAX) packet 17 g (has no administration in time range)  pantoprazole (PROTONIX) EC tablet 40 mg (40 mg Oral Not Given 05/19/20 1015)  Chlorhexidine Gluconate Cloth 2 % PADS 6 each (6 each Topical Given 05/19/20 0837)  MEDLINE mouth rinse (15 mLs Mouth Rinse Given 05/19/20 0914)  ondansetron (ZOFRAN) injection 4 mg (4 mg  Intravenous Given 05/18/20 2333)  meclizine (ANTIVERT) tablet 25 mg (25 mg Oral Given 05/18/20 2333)  potassium chloride 10 mEq in 100 mL IVPB (0 mEq Intravenous Stopped 05/19/20 0229)  magnesium sulfate IVPB 2 g 50 mL (0 g Intravenous Stopped 05/19/20 0230)  promethazine (PHENERGAN) injection 12.5 mg (12.5 mg Intravenous Given 05/19/20 0231)  gadobutrol (GADAVIST) 1 MMOL/ML injection 6.5 mL (6.5 mLs Intravenous Contrast Given 05/19/20 0113)  potassium chloride SA (KLOR-CON) CR tablet 40 mEq (40 mEq Oral Given 05/19/20 0349)  aspirin chewable tablet 324 mg (324 mg Oral Given 05/19/20 0617)  clopidogrel (PLAVIX) tablet 300 mg (300 mg Oral Given 05/19/20 1610)    ED Course  I have reviewed the triage vital signs and the nursing notes.  Pertinent labs & imaging results that were available during my care of the patient were reviewed by me and considered in my medical decision making (see chart for details).    MDM Rules/Calculators/A&P                           65 y/o lady with concern for lethargy, dizziness.  Dizziness described as room spinning sensation. On exam, patient noted to have nystagmus, mild dysarthria as well as abnormal finger-nose-finger and heel-to-shin testing; no signs of trauma.  Concern for possible central etiology for vertigo, posterior circulation stroke.  Last known well last night.  Well outside window for TPA, not VAN positive. Proceeded to MRI to evaluate further.   While awaiting MRI results, patient  signed out to Dr. Clayborne Dana.  Final Clinical Impression(s) / ED Diagnoses Final diagnoses:  Cerebrovascular accident (CVA), unspecified mechanism (HCC)  Vertigo    Rx / DC Orders ED Discharge Orders    None       Milagros Loll, MD 05/19/20 1400

## 2020-05-19 NOTE — Consult Note (Signed)
Physical Medicine and Rehabilitation Consult Reason for Consult: Gait instability Referring Physician: Dr. Craige CottaSood   HPI: Jeanette Barnes is a 65 y.o. right-handed female with history of hypertension, hyperlipidemia, mitral valve prolapse.  Per chart review patient lives alone.  Reportedly independent prior to admission and active.  1 level townhouse with 2 steps to enter.  She has a daughter in the area.  Presented 05/18/2020 with lethargy, dizziness and unstable gait.  Patient got up to go to the bathroom had a vomiting episode fell to her left hit her neck on the trash bin.  Denied loss of consciousness.  MRI/MRA showed patchy acute ischemic nonhemorrhagic infarct involving the left cerebellum, cerebellar vermis, right temporal occipital region and left midbrain pons.  MRA of the neck occlusion of the left vertebral artery with concern for acute dissection.  MRA of the head partial occlusion of the left vertebral artery its origin.  Awaiting plan from interventional radiology.  Currently maintained on aspirin as well as Plavix for CVA prophylaxis.  Echocardiogram pending.  Therapy evaluations completed due to patient decreased functional mobility recommendations of physical medicine rehab consult. She prefers CIR.    Review of Systems  Constitutional: Negative for chills and fever.  HENT: Negative for hearing loss.   Eyes: Negative for blurred vision and double vision.  Respiratory: Negative for shortness of breath.   Cardiovascular: Negative for chest pain and leg swelling.  Gastrointestinal: Positive for constipation and vomiting. Negative for heartburn.  Genitourinary: Negative for dysuria and hematuria.  Musculoskeletal: Positive for falls.  Skin: Negative for rash.  Neurological: Positive for dizziness and weakness.  Psychiatric/Behavioral:       Anxiety  All other systems reviewed and are negative.  Past Medical History:  Diagnosis Date  . AAT (alpha-1-antitrypsin) deficiency  (HCC)   . Anxiety   . Heart murmur    mitral valve prolapse  . Hypercholesteremia   . Hypertension   . Pulmonary nodule   . Vitamin D deficiency    Past Surgical History:  Procedure Laterality Date  . ABDOMINAL HYSTERECTOMY    . BREAST BIOPSY    . BREAST EXCISIONAL BIOPSY Bilateral 1990's   2 Left, 1 Right, all benign   Family History  Problem Relation Age of Onset  . Emphysema Father   . Hypertension Brother   . Kidney disease Brother    Social History:  reports that she has never smoked. She has never used smokeless tobacco. She reports that she does not drink alcohol and does not use drugs. Allergies: No Known Allergies Medications Prior to Admission  Medication Sig Dispense Refill  . acetaminophen (TYLENOL) 500 MG tablet Take 1,000 mg by mouth every 6 (six) hours as needed for mild pain or headache.    . albuterol (VENTOLIN HFA) 108 (90 Base) MCG/ACT inhaler Inhale 2 puffs into the lungs every 6 (six) hours as needed for wheezing or shortness of breath. 18 g 6  . Biotin 1 MG CAPS Take 1 mg by mouth daily.    . cholecalciferol (VITAMIN D) 400 units TABS tablet Take 400 Units by mouth.    . hydrochlorothiazide (HYDRODIURIL) 25 MG tablet Take 25 mg by mouth daily.    . naproxen sodium (ALEVE) 220 MG tablet Take 220 mg by mouth daily as needed (pain/headace).    Marland Kitchen. OVER THE COUNTER MEDICATION Take 1 tablet by mouth daily. Stress relief gummy---olly brand    . vitamin B-12 (CYANOCOBALAMIN) 1000 MCG tablet Take 1,000 mcg by mouth  daily.    Marland Kitchen VITAMIN E PO Take 1 tablet by mouth as needed.      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) Functional Status:  Mobility: Bed Mobility Overal bed mobility: Needs Assistance Bed Mobility: Sidelying to Sit Sidelying to sit: Min assist Transfers Overall transfer level: Needs assistance Equipment used: Rolling walker (2 wheeled),None Transfers: Sit to/from [employer] Sit to Stand: Mod assist,+2 physical assistance,+2 safety/equipment Stand pivot transfers: Mod assist,+2 physical assistance,+2 safety/equipment General transfer comment: initial stand-pivot with mod assist +2 without device; pt with difficulty fully standing and with left lean; with RW mod assist +2 to stand and pivot BSC to recliner Ambulation/Gait Ambulation/Gait assistance: Mod assist,+2 physical assistance,+2 safety/equipment Gait Distance (Feet): 2 Feet Assistive device: Rolling walker (2 wheeled) Gait Pattern/deviations: Step-to pattern General Gait Details: pivotal steps with RW to recliner    ADL: ADL Overall ADL's : Needs assistance/impaired Eating/Feeding: Set up,Sitting Eating/Feeding Details (indicate cue type and reason): recliner, with increased time Grooming: Set up,Sitting Grooming Details (indicate cue type and reason): recliner, with increased time Upper Body Bathing: Minimal assistance,Sitting Upper Body Bathing Details (indicate cue type and reason): recliner, with increased time Lower Body Bathing: Maximal assistance Lower Body Bathing Details (indicate cue type and reason): Mod A +2 sit<>stand Upper Body Dressing : Moderate assistance,Sitting Upper Body Dressing Details (indicate cue type and reason): recliner, with increased time Lower Body Dressing: Maximal assistance Lower Body Dressing Details (indicate cue type and reason): Mod A +2 sit<>stand Toilet Transfer: Moderate assistance,+2 for physical assistance,+2 for  safety/equipment,RW,Stand-pivot,BSC Toileting - Clothing Manipulation Details (indicate cue type and reason): Can wipe front peri area while seated on BSC, needs +2 Mod A sit<>stand and stand pivot  Cognition: Cognition Overall Cognitive Status: Impaired/Different from baseline Arousal/Alertness: Lethargic Orientation Level: Oriented X4 Attention: Sustained Sustained Attention: Impaired Sustained Attention Impairment: Verbal basic Memory: Appears intact Awareness: Appears intact Cognition Arousal/Alertness: Awake/alert Behavior During Therapy: Flat affect Overall Cognitive Status: Impaired/Different from baseline Area of Impairment: Safety/judgement,Awareness,Problem solving Safety/Judgement: Decreased awareness of safety,Decreased awareness of deficits Awareness: Emergent Problem Solving: Slow processing,Requires verbal cues,Requires tactile cues General Comments: At times slow to respond to questions. Very low voice output  Blood pressure (!) 129/53, pulse 60, temperature 98 F (36.7 C), temperature source Oral, resp. rate 18, SpO2 100 %.   Physical Exam Gen: no distress, normal appearing HEENT: oral mucosa pink and moist, NCAT Cardio: Reg rate Chest: normal effort, normal rate of breathing Abd: soft, non-distended Ext: no edema Psych: pleasant, normal affect Skin: intact Neurological:     Comments: Patient is a bit lethargic but arousable.  Makes eye contact with examiner provides name and age. 4/5 strength throughout right side, 4-/5 on left side. Alert and oriented x3   Results for orders placed or performed during the hospital encounter of 05/18/20 (from the past 24 hour(s))  CBC with Differential     Status: Abnormal   Collection Time: 05/18/20  9:19 PM  Result Value Ref Range   WBC 11.6 (H) 4.0 - 10.5 K/uL   RBC 4.28 3.87 - 5.11 MIL/uL   Hemoglobin 12.7 12.0 -  15.0 g/dL   HCT 65.7 84.6 - 96.2 %   MCV 90.2 80.0 - 100.0 fL   MCH 29.7 26.0 - 34.0 pg   MCHC 32.9  30.0 - 36.0 g/dL   RDW 95.2 84.1 - 32.4 %   Platelets 206 150 - 400 K/uL   nRBC 0.0 0.0 - 0.2 %   Neutrophils Relative % 83 %   Neutro Abs 9.6 (H) 1.7 - 7.7 K/uL   Lymphocytes Relative 12 %   Lymphs Abs 1.4 0.7 - 4.0 K/uL   Monocytes Relative 4 %   Monocytes Absolute 0.5 0.1 - 1.0 K/uL   Eosinophils Relative 0 %   Eosinophils Absolute 0.0 0.0 - 0.5 K/uL   Basophils Relative 0 %   Basophils Absolute 0.0 0.0 - 0.1 K/uL   Immature Granulocytes 1 %   Abs Immature Granulocytes 0.08 (H) 0.00 - 0.07 K/uL  Basic metabolic panel     Status: Abnormal   Collection Time: 05/18/20  9:19 PM  Result Value Ref Range   Sodium 137 135 - 145 mmol/L   Potassium 2.7 (LL) 3.5 - 5.1 mmol/L   Chloride 101 98 - 111 mmol/L   CO2 25 22 - 32 mmol/L   Glucose, Bld 143 (H) 70 - 99 mg/dL   BUN 12 8 - 23 mg/dL   Creatinine, Ser 4.01 0.44 - 1.00 mg/dL   Calcium 9.2 8.9 - 02.7 mg/dL   GFR, Estimated >25 >36 mL/min   Anion gap 11 5 - 15  Troponin I (High Sensitivity)     Status: None   Collection Time: 05/18/20  9:19 PM  Result Value Ref Range   Troponin I (High Sensitivity) 5 <18 ng/L  Magnesium     Status: None   Collection Time: 05/18/20  9:19 PM  Result Value Ref Range   Magnesium 1.8 1.7 - 2.4 mg/dL  Resp Panel by RT-PCR (Flu A&B, Covid) Nasopharyngeal Swab     Status: None   Collection Time: 05/18/20 10:08 PM   Specimen: Nasopharyngeal Swab; Nasopharyngeal(NP) swabs in vial transport medium  Result Value Ref Range   SARS Coronavirus 2 by RT PCR NEGATIVE NEGATIVE   Influenza A by PCR NEGATIVE NEGATIVE   Influenza B by PCR NEGATIVE NEGATIVE  Ethanol     Status: None   Collection Time: 05/18/20 10:29 PM  Result Value Ref Range   Alcohol, Ethyl (B) <10 <10 mg/dL  Salicylate level     Status: Abnormal   Collection Time: 05/18/20 10:29 PM  Result Value Ref Range   Salicylate Lvl <7.0 (L) 7.0 - 30.0 mg/dL  Acetaminophen level     Status: Abnormal   Collection Time: 05/18/20 10:29 PM  Result Value  Ref Range   Acetaminophen (Tylenol), Serum <10 (L) 10 - 30 ug/mL  Troponin I (High Sensitivity)     Status: None   Collection Time: 05/18/20 11:19 PM  Result Value Ref Range   Troponin I (High Sensitivity) 5 <18 ng/L  Urinalysis, Routine w reflex microscopic Urine, Clean Catch     Status: Abnormal   Collection Time: 05/19/20  3:40 AM  Result Value Ref Range   Color, Urine YELLOW YELLOW   APPearance CLEAR CLEAR   Specific Gravity, Urine 1.019 1.005 - 1.030   pH 7.0 5.0 - 8.0   Glucose, UA NEGATIVE NEGATIVE mg/dL   Hgb urine dipstick NEGATIVE NEGATIVE   Bilirubin Urine NEGATIVE NEGATIVE   Ketones, ur 20 (A) NEGATIVE mg/dL   Protein, ur NEGATIVE NEGATIVE mg/dL  Nitrite NEGATIVE NEGATIVE   Leukocytes,Ua NEGATIVE NEGATIVE  Rapid urine drug screen (hospital performed)     Status: None   Collection Time: 05/19/20  3:40 AM  Result Value Ref Range   Opiates NONE DETECTED NONE DETECTED   Cocaine NONE DETECTED NONE DETECTED   Benzodiazepines NONE DETECTED NONE DETECTED   Amphetamines NONE DETECTED NONE DETECTED   Tetrahydrocannabinol NONE DETECTED NONE DETECTED   Barbiturates NONE DETECTED NONE DETECTED  HIV Antibody (routine testing w rflx)     Status: None   Collection Time: 05/19/20  3:49 AM  Result Value Ref Range   HIV Screen 4th Generation wRfx Non Reactive Non Reactive  Hemoglobin A1c     Status: None   Collection Time: 05/19/20  3:49 AM  Result Value Ref Range   Hgb A1c MFr Bld 5.5 4.8 - 5.6 %   Mean Plasma Glucose 111.15 mg/dL  Lipid panel     Status: Abnormal   Collection Time: 05/19/20  3:49 AM  Result Value Ref Range   Cholesterol 237 (H) 0 - 200 mg/dL   Triglycerides 53 <867 mg/dL   HDL 66 >67 mg/dL   Total CHOL/HDL Ratio 3.6 RATIO   VLDL 11 0 - 40 mg/dL   LDL Cholesterol 209 (H) 0 - 99 mg/dL  TSH     Status: None   Collection Time: 05/19/20  3:49 AM  Result Value Ref Range   TSH 0.647 0.350 - 4.500 uIU/mL  CBC     Status: Abnormal   Collection Time: 05/19/20   3:49 AM  Result Value Ref Range   WBC 10.8 (H) 4.0 - 10.5 K/uL   RBC 4.13 3.87 - 5.11 MIL/uL   Hemoglobin 12.4 12.0 - 15.0 g/dL   HCT 47.0 96.2 - 83.6 %   MCV 88.1 80.0 - 100.0 fL   MCH 30.0 26.0 - 34.0 pg   MCHC 34.1 30.0 - 36.0 g/dL   RDW 62.9 47.6 - 54.6 %   Platelets 215 150 - 400 K/uL   nRBC 0.0 0.0 - 0.2 %  MRSA PCR Screening     Status: None   Collection Time: 05/19/20  6:50 AM   Specimen: Nasopharyngeal  Result Value Ref Range   MRSA by PCR NEGATIVE NEGATIVE  Glucose, capillary     Status: None   Collection Time: 05/19/20  7:04 AM  Result Value Ref Range   Glucose-Capillary 84 70 - 99 mg/dL   MR ANGIO HEAD WO CONTRAST  Result Date: 05/19/2020 CLINICAL DATA:  Initial evaluation for neuro deficit, stroke suspected, vertigo. EXAM: MRI HEAD WITHOUT CONTRAST MRA HEAD WITHOUT CONTRAST MRA NECK WITHOUT AND WITH CONTRAST TECHNIQUE: Multiplanar, multiecho pulse sequences of the brain and surrounding structures were obtained without intravenous contrast. Angiographic images of the Circle of Willis were obtained using MRA technique without intravenous contrast. Angiographic images of the neck were obtained using MRA technique without and with intravenous contrast. Carotid stenosis measurements (when applicable) are obtained utilizing NASCET criteria, using the distal internal carotid diameter as the denominator. CONTRAST:  6.53mL GADAVIST GADOBUTROL 1 MMOL/ML IV SOLN COMPARISON:  Prior CT from 03/30/2018. FINDINGS: MRI HEAD FINDINGS Brain: Cerebral volume within normal limits for patient age. No significant cerebral white matter disease for age. Patchy multifocal areas of restricted diffusion seen involving the left cerebellum, most pronounced inferiorly. Minimal patchy involvement of the central cerebellar vermis. Additional patchy small volume areas of restricted diffusion seen involving the paramedian right temporal occipital region. Mild diffusion abnormality also noted at the  left  midbrain/pons. No associated hemorrhage or mass effect. Otherwise, gray-white matter differentiation well maintained. No other areas of chronic cortical infarction. No foci of susceptibility artifact to suggest acute or chronic intracranial hemorrhage. No mass lesion, midline shift or mass effect. No hydrocephalus or extra-axial fluid collection. Pituitary gland suprasellar region normal. Midline structures intact. Vascular: Major intracranial vascular flow voids are maintained. Skull and upper cervical spine: Craniocervical junction within normal limits. Bone marrow signal intensity normal. No scalp soft tissue abnormality. Reversal of the normal upper cervical lordosis noted. Sinuses/Orbits: Globes and orbital soft tissues demonstrate no acute finding. Paranasal sinuses are clear. No mastoid effusion. Inner ear structures grossly normal. Other: None. MRA HEAD FINDINGS ANTERIOR CIRCULATION: Visualized distal cervical segments are both widely patent with antegrade flow. Petrous, cavernous, and supraclinoid segments patent without stenosis or other abnormality. A1 segments widely patent. Normal anterior communicating artery complex. Anterior cerebral arteries patent to their distal aspects without stenosis. No M1 stenosis or occlusion. Normal MCA bifurcations. Distal MCA branches well perfused and symmetric. POSTERIOR CIRCULATION: Dominant right vertebral artery widely patent to the vertebrobasilar junction. Right PICA patent and well perfused. Faint flow seen within the partially visualized distal left vertebral artery, with relatively absent flow within the left V3 segment. The left vertebral artery appears occluded as it courses into the cranial vault. Attenuated irregular flow seen within the left V4 segment distally, and remains grossly patent to the vertebrobasilar junction. Left PICA grossly patent at its origin. Basilar patent to its distal aspect without stenosis or visible abnormality. Right superior  cerebral artery widely patent. Left SCA faintly visible and appears grossly patent on time-of-flight sequence, not seen on MIP reconstructions. Both PCAs primarily supplied via the basilar. PCAs appear well perfused to their distal aspects. No intracranial aneurysm. MRA NECK FINDINGS AORTIC ARCH: Visualized aortic arch normal in caliber with normal 3 vessel morphology. No hemodynamically significant stenosis seen about the origin of the great vessels. RIGHT CAROTID SYSTEM: Right CCA patent from its origin to the bifurcation without stenosis. Mild for age atheromatous irregularity about the right carotid bulb with no more than mild 20-25% stenosis by NASCET criteria. Right ICA patent distally without stenosis, evidence for dissection or occlusion. LEFT CAROTID SYSTEM: Left common carotid artery widely patent from its origin to the bifurcation. Mild for age atheromatous irregularity about the left carotid bulb with no more than mild 20-25% by NASCET criteria. Left ICA otherwise patent without stenosis, evidence for dissection, or occlusion. Minimal irregularity about the mid cervical left ICA noted. VERTEBRAL ARTERIES: Both vertebral arteries arise from the subclavian arteries. Right vertebral artery likely dominant. There is a short-segment moderate approximate 50% stenosis involving the pre foraminal right V1 segment (series 1058, image 10). Otherwise, the right vertebral artery is widely patent within the neck without stenosis, evidence for dissection, or occlusion. On the left, the left vertebral artery appears occluded at its origin (series 18, image 32). Distally, the left ICA is markedly attenuated and diminutive, with near occlusion at its distal V2 segment. Some flow is seen within the left V3 segment, but is also markedly attenuated and irregular. Given the presence of acute posterior circulation infarcts, finding raises the possibility for an acute dissection. IMPRESSION: MRI HEAD IMPRESSION: 1. Patchy  acute ischemic nonhemorrhagic infarcts involving the left cerebellum, cerebellar vermis, right temporoccipital region, and left midbrain/pons. 2. Otherwise normal brain MRI for age. MRA HEAD IMPRESSION: 1. Occlusion of the left vertebral artery with concern for acute dissection as below. Attenuated irregular flow within  the left V4 segment distally may in part be retrograde in nature. Dominant right vertebral artery widely patent. 2. Poor visualization of the left SCA, which also may be partially occluded given the distribution of infarcts. 3. Widely patent anterior circulation. MRA NECK IMPRESSION: 1. 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 Given the presence of acute posterior circulation infarcts, these findings raise the possibility for an acute arterial dissection. Correlation with dedicated CTA could be performed for further evaluation as warranted. 2. Short-segment moderate approximate 50% stenosis involving the pre foraminal right V1 segment. Otherwise wide patency of the dominant right vertebral artery. 3. Mild for age atheromatous change about the carotid bifurcations without significant stenosis. Otherwise wide patency of both carotid artery systems in the neck. Critical Value/emergent results were called by telephone at the time of interpretation on 05/19/2020 at 1:34 a.m. to provider Dr. Marily Memos, who verbally acknowledged these results. Electronically Signed   By: Rise Mu M.D.   On: 05/19/2020 01:56   MR ANGIO NECK W WO CONTRAST  Result Date: 05/19/2020 CLINICAL DATA:  Initial evaluation for neuro deficit, stroke suspected, vertigo. EXAM: MRI HEAD WITHOUT CONTRAST MRA HEAD WITHOUT CONTRAST MRA NECK WITHOUT AND WITH CONTRAST TECHNIQUE: Multiplanar, multiecho pulse sequences of the brain and surrounding structures were obtained without intravenous contrast. Angiographic images  of the Circle of Willis were obtained using MRA technique without intravenous contrast. Angiographic images of the neck were obtained using MRA technique without and with intravenous contrast. Carotid stenosis measurements (when applicable) are obtained utilizing NASCET criteria, using the distal internal carotid diameter as the denominator. CONTRAST:  6.94mL GADAVIST GADOBUTROL 1 MMOL/ML IV SOLN COMPARISON:  Prior CT from 03/30/2018. FINDINGS: MRI HEAD FINDINGS Brain: Cerebral volume within normal limits for patient age. No significant cerebral white matter disease for age. Patchy multifocal areas of restricted diffusion seen involving the left cerebellum, most pronounced inferiorly. Minimal patchy involvement of the central cerebellar vermis. Additional patchy small volume areas of restricted diffusion seen involving the paramedian right temporal occipital region. Mild diffusion abnormality also noted at the left midbrain/pons. No associated hemorrhage or mass effect. Otherwise, gray-white matter differentiation well maintained. No other areas of chronic cortical infarction. No foci of susceptibility artifact to suggest acute or chronic intracranial hemorrhage. No mass lesion, midline shift or mass effect. No hydrocephalus or extra-axial fluid collection. Pituitary gland suprasellar region normal. Midline structures intact. Vascular: Major intracranial vascular flow voids are maintained. Skull and upper cervical spine: Craniocervical junction within normal limits. Bone marrow signal intensity normal. No scalp soft tissue abnormality. Reversal of the normal upper cervical lordosis noted. Sinuses/Orbits: Globes and orbital soft tissues demonstrate no acute finding. Paranasal sinuses are clear. No mastoid effusion. Inner ear structures grossly normal. Other: None. MRA HEAD FINDINGS ANTERIOR CIRCULATION: Visualized distal cervical segments are both widely patent with antegrade flow. Petrous, cavernous, and supraclinoid  segments patent without stenosis or other abnormality. A1 segments widely patent. Normal anterior communicating artery complex. Anterior cerebral arteries patent to their distal aspects without stenosis. No M1 stenosis or occlusion. Normal MCA bifurcations. Distal MCA branches well perfused and symmetric. POSTERIOR CIRCULATION: Dominant right vertebral artery widely patent to the vertebrobasilar junction. Right PICA patent and well perfused. Faint flow seen within the partially visualized distal left vertebral artery, with relatively absent flow within the left V3 segment. The left vertebral artery appears occluded as it courses into the cranial vault. Attenuated  irregular flow seen within the left V4 segment distally, and remains grossly patent to the vertebrobasilar junction. Left PICA grossly patent at its origin. Basilar patent to its distal aspect without stenosis or visible abnormality. Right superior cerebral artery widely patent. Left SCA faintly visible and appears grossly patent on time-of-flight sequence, not seen on MIP reconstructions. Both PCAs primarily supplied via the basilar. PCAs appear well perfused to their distal aspects. No intracranial aneurysm. MRA NECK FINDINGS AORTIC ARCH: Visualized aortic arch normal in caliber with normal 3 vessel morphology. No hemodynamically significant stenosis seen about the origin of the great vessels. RIGHT CAROTID SYSTEM: Right CCA patent from its origin to the bifurcation without stenosis. Mild for age atheromatous irregularity about the right carotid bulb with no more than mild 20-25% stenosis by NASCET criteria. Right ICA patent distally without stenosis, evidence for dissection or occlusion. LEFT CAROTID SYSTEM: Left common carotid artery widely patent from its origin to the bifurcation. Mild for age atheromatous irregularity about the left carotid bulb with no more than mild 20-25% by NASCET criteria. Left ICA otherwise patent without stenosis, evidence for  dissection, or occlusion. Minimal irregularity about the mid cervical left ICA noted. VERTEBRAL ARTERIES: Both vertebral arteries arise from the subclavian arteries. Right vertebral artery likely dominant. There is a short-segment moderate approximate 50% stenosis involving the pre foraminal right V1 segment (series 1058, image 10). Otherwise, the right vertebral artery is widely patent within the neck without stenosis, evidence for dissection, or occlusion. On the left, the left vertebral artery appears occluded at its origin (series 18, image 32). Distally, the left ICA is markedly attenuated and diminutive, with near occlusion at its distal V2 segment. Some flow is seen within the left V3 segment, but is also markedly attenuated and irregular. Given the presence of acute posterior circulation infarcts, finding raises the possibility for an acute dissection. IMPRESSION: MRI HEAD IMPRESSION: 1. Patchy acute ischemic nonhemorrhagic infarcts involving the left cerebellum, cerebellar vermis, right temporoccipital region, and left midbrain/pons. 2. Otherwise normal brain MRI for age. MRA HEAD IMPRESSION: 1. Occlusion of the left vertebral artery with concern for acute dissection as below. Attenuated irregular flow within the left V4 segment distally may in part be retrograde in nature. Dominant right vertebral artery widely patent. 2. Poor visualization of the left SCA, which also may be partially occluded given the distribution of infarcts. 3. Widely patent anterior circulation. MRA NECK IMPRESSION: 1. 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 Given the presence of acute posterior circulation infarcts, these findings raise the possibility for an acute arterial dissection. Correlation with dedicated CTA could be performed for further evaluation as warranted. 2. Short-segment moderate approximate 50% stenosis  involving the pre foraminal right V1 segment. Otherwise wide patency of the dominant right vertebral artery. 3. Mild for age atheromatous change about the carotid bifurcations without significant stenosis. Otherwise wide patency of both carotid artery systems in the neck. Critical Value/emergent results were called by telephone at the time of interpretation on 05/19/2020 at 1:34 a.m. to provider Dr. Marily Memos, who verbally acknowledged these results. Electronically Signed   By: Rise Mu M.D.   On: 05/19/2020 01:56   MR BRAIN WO CONTRAST  Result Date: 05/19/2020 CLINICAL DATA:  Initial evaluation for neuro deficit, stroke suspected, vertigo. EXAM: MRI HEAD WITHOUT CONTRAST MRA HEAD WITHOUT CONTRAST MRA NECK WITHOUT AND WITH CONTRAST TECHNIQUE: Multiplanar, multiecho pulse sequences of the  brain and surrounding structures were obtained without intravenous contrast. Angiographic images of the Circle of Willis were obtained using MRA technique without intravenous contrast. Angiographic images of the neck were obtained using MRA technique without and with intravenous contrast. Carotid stenosis measurements (when applicable) are obtained utilizing NASCET criteria, using the distal internal carotid diameter as the denominator. CONTRAST:  6.47mL GADAVIST GADOBUTROL 1 MMOL/ML IV SOLN COMPARISON:  Prior CT from 03/30/2018. FINDINGS: MRI HEAD FINDINGS Brain: Cerebral volume within normal limits for patient age. No significant cerebral white matter disease for age. Patchy multifocal areas of restricted diffusion seen involving the left cerebellum, most pronounced inferiorly. Minimal patchy involvement of the central cerebellar vermis. Additional patchy small volume areas of restricted diffusion seen involving the paramedian right temporal occipital region. Mild diffusion abnormality also noted at the left midbrain/pons. No associated hemorrhage or mass effect. Otherwise, gray-white matter differentiation well  maintained. No other areas of chronic cortical infarction. No foci of susceptibility artifact to suggest acute or chronic intracranial hemorrhage. No mass lesion, midline shift or mass effect. No hydrocephalus or extra-axial fluid collection. Pituitary gland suprasellar region normal. Midline structures intact. Vascular: Major intracranial vascular flow voids are maintained. Skull and upper cervical spine: Craniocervical junction within normal limits. Bone marrow signal intensity normal. No scalp soft tissue abnormality. Reversal of the normal upper cervical lordosis noted. Sinuses/Orbits: Globes and orbital soft tissues demonstrate no acute finding. Paranasal sinuses are clear. No mastoid effusion. Inner ear structures grossly normal. Other: None. MRA HEAD FINDINGS ANTERIOR CIRCULATION: Visualized distal cervical segments are both widely patent with antegrade flow. Petrous, cavernous, and supraclinoid segments patent without stenosis or other abnormality. A1 segments widely patent. Normal anterior communicating artery complex. Anterior cerebral arteries patent to their distal aspects without stenosis. No M1 stenosis or occlusion. Normal MCA bifurcations. Distal MCA branches well perfused and symmetric. POSTERIOR CIRCULATION: Dominant right vertebral artery widely patent to the vertebrobasilar junction. Right PICA patent and well perfused. Faint flow seen within the partially visualized distal left vertebral artery, with relatively absent flow within the left V3 segment. The left vertebral artery appears occluded as it courses into the cranial vault. Attenuated irregular flow seen within the left V4 segment distally, and remains grossly patent to the vertebrobasilar junction. Left PICA grossly patent at its origin. Basilar patent to its distal aspect without stenosis or visible abnormality. Right superior cerebral artery widely patent. Left SCA faintly visible and appears grossly patent on time-of-flight sequence,  not seen on MIP reconstructions. Both PCAs primarily supplied via the basilar. PCAs appear well perfused to their distal aspects. No intracranial aneurysm. MRA NECK FINDINGS AORTIC ARCH: Visualized aortic arch normal in caliber with normal 3 vessel morphology. No hemodynamically significant stenosis seen about the origin of the great vessels. RIGHT CAROTID SYSTEM: Right CCA patent from its origin to the bifurcation without stenosis. Mild for age atheromatous irregularity about the right carotid bulb with no more than mild 20-25% stenosis by NASCET criteria. Right ICA patent distally without stenosis, evidence for dissection or occlusion. LEFT CAROTID SYSTEM: Left common carotid artery widely patent from its origin to the bifurcation. Mild for age atheromatous irregularity about the left carotid bulb with no more than mild 20-25% by NASCET criteria. Left ICA otherwise patent without stenosis, evidence for dissection, or occlusion. Minimal irregularity about the mid cervical left ICA noted. VERTEBRAL ARTERIES: Both vertebral arteries arise from the subclavian arteries. Right vertebral artery likely dominant. There is a short-segment moderate approximate 50% stenosis involving the pre foraminal right V1  segment (series 1058, image 10). Otherwise, the right vertebral artery is widely patent within the neck without stenosis, evidence for dissection, or occlusion. On the left, the left vertebral artery appears occluded at its origin (series 18, image 32). Distally, the left ICA is markedly attenuated and diminutive, with near occlusion at its distal V2 segment. Some flow is seen within the left V3 segment, but is also markedly attenuated and irregular. Given the presence of acute posterior circulation infarcts, finding raises the possibility for an acute dissection. IMPRESSION: MRI HEAD IMPRESSION: 1. Patchy acute ischemic nonhemorrhagic infarcts involving the left cerebellum, cerebellar vermis, right temporoccipital  region, and left midbrain/pons. 2. Otherwise normal brain MRI for age. MRA HEAD IMPRESSION: 1. Occlusion of the left vertebral artery with concern for acute dissection as below. Attenuated irregular flow within the left V4 segment distally may in part be retrograde in nature. Dominant right vertebral artery widely patent. 2. Poor visualization of the left SCA, which also may be partially occluded given the distribution of infarcts. 3. Widely patent anterior circulation. MRA NECK IMPRESSION: 1. 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 Given the presence of acute posterior circulation infarcts, these findings raise the possibility for an acute arterial dissection. Correlation with dedicated CTA could be performed for further evaluation as warranted. 2. Short-segment moderate approximate 50% stenosis involving the pre foraminal right V1 segment. Otherwise wide patency of the dominant right vertebral artery. 3. Mild for age atheromatous change about the carotid bifurcations without significant stenosis. Otherwise wide patency of both carotid artery systems in the neck. Critical Value/emergent results were called by telephone at the time of interpretation on 05/19/2020 at 1:34 a.m. to provider Dr. Marily Memos, who verbally acknowledged these results. Electronically Signed   By: Rise Mu M.D.   On: 05/19/2020 01:56   DG Chest Portable 1 View  Result Date: 05/18/2020 CLINICAL DATA:  Dizziness for several hours EXAM: PORTABLE CHEST 1 VIEW COMPARISON:  12/26/2019 CT FINDINGS: The heart size and mediastinal contours are within normal limits. Both lungs are clear. The visualized skeletal structures are unremarkable. IMPRESSION: No active disease. Electronically Signed   By: Alcide Clever M.D.   On: 05/18/2020 22:22    Assessment/Plan: Diagnosis: Acute vetebrobasilar strokes 1. Does the need for close,  24 hr/day medical supervision in concert with the patient's rehab needs make it unreasonable for this patient to be served in a less intensive setting? Yes 2. Co-Morbidities requiring supervision/potential complications:  1. Cerebral edema 2. Left extracranial vertebral artery dissection 3. Trauma to soft tissue of left neck 4. Mild left sided weakness: will benefit from intensive PT and OT 5. Hypokalemia: will benefit from regular monitoring 6. Mid ataxia in left upper>lower extremities: will benefit from intensive PT and OT 3. Due to bladder management, bowel management, safety, skin/wound care, disease management, medication administration, pain management and patient education, does the patient require 24 hr/day rehab nursing? Yes 4. Does the patient require coordinated care of a physician, rehab nurse, therapy disciplines of PT, OT, SLP to address physical and functional deficits in the context of the above medical diagnosis(es)? Yes Addressing deficits in the following areas: balance, endurance, locomotion, strength, transferring, bowel/bladder control, bathing, dressing, feeding, grooming, toileting, speech and psychosocial support 5. Can the patient actively participate in an intensive therapy program of at least 3 hrs of therapy per day at least 5 days per week? Yes 6. The  potential for patient to make measurable gains while on inpatient rehab is good 7. Anticipated functional outcomes upon discharge from inpatient rehab are modified independent  with PT, modified independent with OT, modified independent with SLP. 8. Estimated rehab length of stay to reach the above functional goals is: 10-14 days 9. Anticipated discharge destination: Home 10. Overall Rehab/Functional Prognosis: good  RECOMMENDATIONS: This patient's condition is appropriate for continued rehabilitative care in the following setting: CIR Patient has agreed to participate in recommended program. Yes Note that insurance  prior authorization may be required for reimbursement for recommended care.  Comment: Thank you for this consult. Admission coordinator to follow.   I have personally performed a face to face diagnostic evaluation, including, but not limited to relevant history and physical exam findings, of this patient and developed relevant assessment and plan.  Additionally, I have reviewed and concur with the physician assistant's documentation above.  Sula Soda, MD   Mcarthur Rossetti Angiulli, PA-C 05/19/2020

## 2020-05-19 NOTE — Progress Notes (Signed)
Inpatient Rehab Admissions Coordinator Note:   Per therapy recommendations, pt was screened for CIR candidacy by Barbara Ahart, MS CCC-SLP. At this time, Pt. Appears to have functional decline and is a good candidate for CIR. Will place order for rehab consult per protocol.  Please contact me with questions.   Zyria Fiscus, MS, CCC-SLP Rehab Admissions Coordinator  [phone] (celll) [phone] (office)  

## 2020-05-19 NOTE — Evaluation (Signed)
Speech Language Pathology Evaluation Patient Details Name: Jeanette Barnes MRN: [id number] DOB: 1956-01-02 Today's Date: 05/19/2020 Time: 0923-3007 SLP Time Calculation (min) (ACUTE ONLY): 11 min  Problem List:  Patient Active Problem List   Diagnosis Date Noted  . Stroke (HCC) 05/19/2020  . Vertebral artery dissection (HCC) 05/19/2020  . Hypokalemia 05/19/2020  . HTN (hypertension) 05/19/2020  . Leukocytosis 05/19/2020  . Atypical chest pain 05/27/2017   Past Medical History:  Past Medical History:  Diagnosis Date  . AAT (alpha-1-antitrypsin) deficiency (HCC)   . Anxiety   . Heart murmur    mitral valve prolapse  . Hypercholesteremia   . Hypertension   . Pulmonary nodule   . Vitamin D deficiency    Past Surgical History:  Past Surgical History:  Procedure Laterality Date  . ABDOMINAL HYSTERECTOMY    . BREAST BIOPSY    . BREAST EXCISIONAL BIOPSY Bilateral 1990's   2 Left, 1 Right, all benign   HPI:  Jeanette Barnes is a 65 y.o. female with medical history significant of hypertension, hyperlipidemia presenting to the ED for evaluation of vertigo and ataxia. Fell with possibly hitting her head. MRI showing patchy acute ischemic nonhemorrhagic infarcts involving the left cerebellum, cerebellar vermis, right temporal occipital region, and left midbrain/pons.MRA head and neck showing findings concerning for acute extracranial vertebral artery dissection   Assessment / Plan / Recommendation Clinical Impression  Pt sitting in recliner s/p OT/PT evals; quite sleepy but arousable for brief initial cognitive assessment.  Recall of timeline of admission and staff interactions is good. 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  Speech is low volume, mildly dysfluent but language expression and comprehension are WNL. Pt is oriented to time/place/situation.  She engaged in tasks of sustained attention with cues needed, likely secondary to her  drowsiness.  SLP will follow for ongoing assessment/tx as level of alertness improves.    SLP Assessment  SLP Recommendation/Assessment: Patient needs continued Speech Lanaguage Pathology Services SLP Visit Diagnosis: Cognitive communication deficit (R41.841)    Follow Up Recommendations  Other (comment) (tba)    Frequency and Duration min 2x/week  2 weeks      SLP Evaluation Cognition  Overall Cognitive Status: Impaired/Different from baseline Arousal/Alertness: Lethargic Orientation Level: Oriented X4 Attention: Sustained Sustained Attention: Impaired Sustained Attention Impairment: Verbal basic Memory: Appears intact Awareness: Appears intact       Comprehension  Auditory Comprehension Overall Auditory Comprehension: Appears within functional limits for tasks assessed Reading Comprehension Reading Status: Not tested    Expression Expression Primary Mode of Expression: Verbal Verbal Expression Overall Verbal Expression: Appears within functional limits for tasks assessed Written Expression Dominant Hand: Right   Oral / Motor  Motor Speech Overall Motor Speech: Impaired   GO                   Jeanette Eber L. Samson Frederic, MA CCC/SLP Acute Rehabilitation Services Office number [phone] Pager [phone]  Jeanette Barnes 05/19/2020, 11:15 AM

## 2020-05-20 DIAGNOSIS — I63112 Cerebral infarction due to embolism of left vertebral artery: Secondary | ICD-10-CM

## 2020-05-20 LAB — BASIC METABOLIC PANEL
Anion gap: 7 (ref 5–15)
BUN: 13 mg/dL (ref 8–23)
CO2: 27 mmol/L (ref 22–32)
Calcium: 9.3 mg/dL (ref 8.9–10.3)
Chloride: 105 mmol/L (ref 98–111)
Creatinine, Ser: 0.79 mg/dL (ref 0.44–1.00)
GFR, Estimated: >60 mL/min (ref >60)
Glucose, Bld: 87 mg/dL (ref 70–99)
Potassium: 4.4 mmol/L (ref 3.5–5.1)
Sodium: 139 mmol/L (ref 135–145)

## 2020-05-20 LAB — CBC
HCT: 37.5 % (ref 36.0–46.0)
Hemoglobin: 12.7 g/dL (ref 12.0–15.0)
MCH: 30.5 pg (ref 26.0–34.0)
MCHC: 33.9 g/dL (ref 30.0–36.0)
MCV: 89.9 fL (ref 80.0–100.0)
Platelets: 196 10*3/uL (ref 150–400)
RBC: 4.17 MIL/uL (ref 3.87–5.11)
RDW: 13.5 % (ref 11.5–15.5)
WBC: 6.7 10*3/uL (ref 4.0–10.5)
nRBC: 0 % (ref 0.0–0.2)

## 2020-05-20 MED ORDER — ATORVASTATIN CALCIUM 10 MG PO TABS
20.0000 mg | ORAL_TABLET | Freq: Every day | ORAL | Status: DC
  Administered 2020-05-20: 20 mg via ORAL
  Filled 2020-05-20: qty 2

## 2020-05-20 NOTE — Progress Notes (Addendum)
Inpatient Rehabilitation Admissions Coordinator                              I met with patient at bedside for rehab assessment. We discussed goals and expectations of a possible Cir admit. She states there has been discussion about further procedures concerning intervention for her vessels. I await further Neuro recommendations before I pursue insurance approval for a possible CIR admit. I contacted Olivencia-Simmon with stroke service for clarity. She will check with Dr. Rory Percy.  Danne Baxter, RN, MSN Rehab Admissions Coordinator [phone] 05/20/2020 1:53 PM

## 2020-05-20 NOTE — Progress Notes (Addendum)
STROKE TEAM PROGRESS NOTE   INTERVAL HISTORY  Patient reports having intermittent vertigo and mild headaches for the past 6 months.  Most recently she has experienced headaches with associated diplopia.  On 3/6, she woke up with a headache, felt dizzy and vomited. She felt unsteady while walking back to her bedroom and had a fall hitting the back of her head on the trash can that she was carrying.  Her daughter found her on the floor and was conscious. She was too weak to get up from the floor and family called EMS.  MRI brain showed moderate sized left cerebellum as well as acute ischemic strokes in the cerebellar vermis, right temporoccipital/PCA territory and left midbrain/pons.  MRA head and neck showed occlusion of the left vertebral artery and attenuated distal L V4 flow which may be retrograde flow.  She was started on aspirin and plavix and was admitted to the ICU for close neuro monitoring.        Neuro exam reveals nystagmus with gaze to the right,  left lower extremity mild ataxia with 4+/5 strength.      We will plan for cerebral angiogram tomorrow to evaluate left vertebral artery occlusion. NPO after midnight.  Continue Aspirin 81mg  and plavix 75mg .     Vitals:   05/20/20 1145 05/20/20 1200 05/20/20 1300 05/20/20 1400  BP:  (!) 156/67 (!) 149/75 135/66  Pulse:  92 (!) 102 76  Resp:  (!) 22 (!) 21 20  Temp: 98.3 F (36.8 C)     TempSrc: Oral     SpO2:  99% 98% 100%  Weight:       CBC:  Recent Labs  Lab 05/18/20 2119 05/19/20 0349 05/20/20 0330  WBC 11.6* 10.8* 6.7  NEUTROABS 9.6*  --   --   HGB 12.7 12.4 12.7  HCT 38.6 36.4 37.5  MCV 90.2 88.1 89.9  PLT 206 215 196   Basic Metabolic Panel:  Recent Labs  Lab 05/18/20 2119 05/20/20 0330  NA 137 139  K 2.7* 4.4  CL 101 105  CO2 25 27  GLUCOSE 143* 87  BUN 12 13  CREATININE 0.65 0.79  CALCIUM 9.2 9.3  MG 1.8  --    Lipid Panel:  Recent Labs  Lab 05/19/20 0349  CHOL 237*  TRIG 53  HDL 66  CHOLHDL 3.6   VLDL 11  LDLCALC 160*   HgbA1c:  Recent Labs  Lab 05/19/20 0349  HGBA1C 5.5   Urine Drug Screen:  Recent Labs  Lab 05/19/20 0340  LABOPIA NONE DETECTED  COCAINSCRNUR NONE DETECTED  LABBENZ NONE DETECTED  AMPHETMU NONE DETECTED  THCU NONE DETECTED  LABBARB NONE DETECTED    Alcohol Level  Recent Labs  Lab 05/18/20 2229  ETH <10    IMAGING past 24 hours ECHOCARDIOGRAM COMPLETE  Result Date: 05/19/2020 IMPRESSIONS  1. Left ventricular ejection fraction, by estimation, is 60 to 65%. The left ventricle has normal function. The left ventricle has no regional wall motion abnormalities. There is mild concentric left ventricular hypertrophy with more notable, discrete thickening of the basal-septal segment. Left ventricular diastolic parameters are consistent with Grade I diastolic dysfunction (impaired relaxation).  2. Right ventricular systolic function is normal. The right ventricular size is mildly enlarged. There is normal pulmonary artery systolic pressure. The estimated right ventricular systolic pressure is 26.6 mmHg.  3. The mitral valve is grossly normal. Trivial mitral valve regurgitation.  4. The aortic valve is tricuspid. Aortic valve regurgitation is not visualized.  No aortic stenosis is present.  5. The inferior vena cava is normal in size with greater than 50% respiratory variability, suggesting right atrial pressure of 3 mmHg.   05/18/2020 MRA HEAD IMPRESSION:  1. Occlusion of the left vertebral artery with concern for acute dissection as below. Attenuated irregular flow within the left V4 segment distally may in part be retrograde in nature. Dominant right vertebral artery widely patent. 2. Poor visualization of the left SCA, which also may be partially occluded given the distribution of infarcts. 3. Widely patent anterior circulation.  05/18/2020 MRI Head Patchy acute ischemic nonhemorrhagic infarcts involving the left cerebellum, cerebellar vermis, right  temporoccipital region, and left midbrain/pons.  MRA Neck 1. 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 Given the presence of acute posterior circulation infarcts, these findings raise the possibility for an acute arterial dissection. Correlation with dedicated CTA could be performed for further evaluation as warranted. 2. Short-segment moderate approximate 50% stenosis involving the preforaminal right V1 segment. Otherwise wide patency of the dominant right vertebral artery. 3. Mild for age atheromatous change about the carotid bifurcations without significant stenosis. Otherwise wide patency of both carotid artery systems in the neck.  PHYSICAL EXAM Constitutional: Appears well-developed and well-nourished.  Psych: Affect appropriate to situation Eyes: No scleral injection HENT: No OP obstruction. Head: Normocephalic.  Cardiovascular: Normal rate and regular rhythm.  Respiratory: Effort normal, symmetric excursions bilaterally, no audible wheezing. GI: Soft.  No distension. There is no tenderness.  Skin: WDI  Neuro: Mental Status: Patient awake and alert, oriented to person, place, month, year, and situation. Patient is able to give a clear and coherent history. Speech fluent, intact comprehension and repetition. No dysarthria Visual Fields are full. Pupils are equal, round, and reactive to light. EOM: nystagmus with gaze to the right, no ptosis or diplopia.  Facial sensation is symmetric to temperature Facial movement is symmetric.  Hearing is intact to voice. Uvula midline and palate elevates symmetrically. Shoulder shrug is symmetric. Tongue is midline without atrophy or fasciculations.  Tone is normal. Bulk is normal. 4+/5 left lower extremity. Sensation is symmetric to light touch and temperature in the arms and legs. Deep Tendon Reflexes: 2+ and symmetric in the  biceps and patellae. Toes are downgoing bilaterally. FNF intact and HKS are mild ataxia left leg. Gait - Deferred  ASSESSMENT/PLAN Jeanette Barnes is a 65 y.o. female with history of hypertension, HLD, mitral valve prolapse who presented to the ED after experiencing nausea, vomiting, vertigo and ataxia. She was found on the floor (3/6) too weak to get up.  Her daughter called EMS.  LKN on 3/5 evening.  Brain MRI showed patchy acute ischemic nonhemorrhagic infarcts involving the left cerebellum, cerebellar vermis, right temporal occipital region, and left midbrain/pons. MRA head and neck showed findings concerning for acute vertebral artery dissection.    Stroke:  left cerebellum, cerebellar vermis, right temporoccipital and left midbrain/pons infarcts likely due to occlusion of the left vertebral artery.  MRI head: Patchy acute ischemic nonhemorrhagic infarcts involving the left cerebellum, cerebellar vermis, right temporoccipital region, and left midbrain/pons.  MRA head: Partial occlusion of the left vertebral artery at its origin, with severely attenuated and irregular flow distally within the left V2 and V3 segments. Poor visualization of the left SCA, which also may be partially occluded given the distribution of infarcts.  MRA Neck: Short-segment moderate approximate 50% stenosis involving the preforaminal  right V1 segment.   2D Echo EF 60-65%, Grade I diastolic dysfunction  LDL 160  HgbA1c 5.5  VTE prophylaxis - SCDs  Diet: heart healthy  No antithrombotic prior to admission, now on aspirin 81 mg daily and clopidogrel 75 mg daily.   Therapy recommendations:  PT: CIR, OT CIR  Disposition:  pending  Left Vertebral artery occlusion  Concern for possible dissection  Neurovascular consulted: plan for cerebral angiogram tomorrow  SBP goal <160  Aspirin 81mg , Plavix 75mg   Hypertension  Home meds:  Hydrochlorothiazide 25mg  daily  Stable . SBP goal <160 . Long-term  BP goal normotensive  Hyperlipidemia  Home meds:  none  LDL 160, goal < 70  Increase atorvastating to 40mg  daily  Continue statin at discharge   Other Stroke Risk Factors  Advanced Age >/= 52   Coronary artery disease: Murmur, Mitral valve prolapse  Other Active Problems  History of alpha 1 antitrypsin deficiency, phenotype SS, Normal PFTs  anxiety  Hospital day # 1  Attending addendum Patient seen and examined.  Imaging personally reviewed. Agree with history and physical documented above. Discussed with Dr. left vertebral artery finding could be chronic stenosis with acute occlusion versus a dissection.  Stroke could be secondary to the vascular lesion versus cardioembolic.  After the arteriogram, will decide on long-term cardiac monitoring. Arteriogram tomorrow.  N.p.o. after midnight. Discussed with the patient and the interventionalists. We will continue to follow. Updated Dr. with the plan.  -- , MD Stroke Neurology Pager: [phone]   CRITICAL CARE ATTESTATION Performed by: Louis Meckel, MD Total critical care time: 35 minutes Critical care time was exclusive of separately billable procedures and treating other patients and/or supervising APPs/Residents/Students Critical care was necessary to treat or prevent imminent or life-threatening deterioration due to stroke This patient is critically ill and at significant risk for neurological worsening and/or death and care requires constant monitoring. Critical care was time spent personally by me on the following activities: development of treatment plan with patient and/or surrogate as well as nursing, discussions with consultants, evaluation of patient's response to treatment, examination of patient, obtaining history from patient or surrogate, ordering and performing treatments and interventions, ordering and review of laboratory studies, ordering and review of radiographic  studies, pulse oximetry, re-evaluation of patient's condition, participation in multidisciplinary rounds and medical decision making of high complexity in the care of this patient.   To contact Stroke Continuity provider, please refer to Craige Cotta. After hours, contact General Neurology

## 2020-05-20 NOTE — Progress Notes (Signed)
Physical Therapy Treatment Patient Details Name: Jeanette Barnes MRN: [id number] DOB: 1955-09-27 Today's Date: 05/20/2020    History of Present Illness Jeanette Barnes is a 65 y.o. female with medical history significant of hypertension, hyperlipidemia presenting to the ED for evaluation of vertigo and ataxia. Fell with possibly hitting her head. MRI showing patchy acute ischemic nonhemorrhagic infarcts involving the left cerebellum, cerebellar vermis, right temporal occipital region, and left midbrain/pons.MRA head and neck showing findings concerning for acute extracranial vertebral artery dissection    PT Comments    Pt admitted with above diagnosis. Pt was able to ambulate with min assist on unit with RW with imbalance at times. Pt with imbalance toward end of walk when turning head and looking around.  Also HR to 141 bpm therefore let pt sit down and rest. Will continue to follow acutely.  Pt currently with functional limitations due to balance and endurance deficits. Pt will benefit from skilled PT to increase their independence and safety with mobility to allow discharge to the venue listed below.    Follow Up Recommendations  CIR     Equipment Recommendations  Rolling walker with 5" wheels    Recommendations for Other Services Rehab consult     Precautions / Restrictions Precautions Precautions: Fall Restrictions Weight Bearing Restrictions: No    Mobility  Bed Mobility Overal bed mobility: Needs Assistance Bed Mobility: Sidelying to Sit;Rolling Rolling: Min guard Sidelying to sit: Min guard       General bed mobility comments: No assist to come to EOB just incr time    Transfers Overall transfer level: Needs assistance Equipment used: Rolling walker (2 wheeled);None Transfers: Sit to/from [employer] Sit to Stand: +2 safety/equipment;Min assist Stand pivot transfers: +2 safety/equipment;Min assist       General transfer comment: initial  stand-pivot with min assist +1 without device to 3N1; with RW min assist +1 to stand as pt needing steadying assist initially  Ambulation/Gait Ambulation/Gait assistance: +2 safety/equipment;Min assist Gait Distance (Feet): 320 Feet Assistive device: Rolling walker (2 wheeled) Gait Pattern/deviations: Step-through pattern;Decreased stride length   Gait velocity interpretation: <1.31 ft/sec, indicative of household ambulator General Gait Details: Pt was able to walk on unit with min assist and chair follow for safety.  Pt at times stated she felt unsteady but was able to complete 2 laps on unit.  Pt did need to sit at 320 feet as HR from 102-141 bpm and pt experienced imbalance at this time in both directions. Also noted that if pt looked around she was off balance with RW in use.   Stairs             Wheelchair Mobility    Modified Rankin (Stroke Patients Only) Modified Rankin (Stroke Patients Only) Pre-Morbid Rankin Score: No symptoms Modified Rankin: No significant disability     Balance Overall balance assessment: Needs assistance Sitting-balance support: Feet supported Sitting balance-Leahy Scale: Fair Sitting balance - Comments: No lean today   Standing balance support: Bilateral upper extremity supported Standing balance-Leahy Scale: Poor Standing balance comment: reliant on RW and external A of therapists                            Cognition Arousal/Alertness: Awake/alert Behavior During Therapy: Flat affect Overall Cognitive Status: Impaired/Different from baseline Area of Impairment: Safety/judgement;Awareness;Problem solving  Safety/Judgement: Decreased awareness of safety;Decreased awareness of deficits Awareness: Emergent Problem Solving: Requires verbal cues;Requires tactile cues        Exercises Other Exercises Other Exercises: Sit to stand with min guard assist x 10.    General Comments General  comments (skin integrity, edema, etc.): BP stable, HR 102-141 bpm, O2 sat 98%      Pertinent Vitals/Pain Pain Assessment: No/denies pain    Home Living                      Prior Function            PT Goals (current goals can now be found in the care plan section) Acute Rehab PT Goals Patient Stated Goal: to be able to work (was suppose to start a job in 2 days) Progress towards PT goals: Progressing toward goals    Frequency    Min 4X/week      PT Plan Current plan remains appropriate    Co-evaluation              AM-PAC PT "6 Clicks" Mobility   Outcome Measure  Help needed turning from your back to your side while in a flat bed without using bedrails?: None Help needed moving from lying on your back to sitting on the side of a flat bed without using bedrails?: A Little Help needed moving to and from a bed to a chair (including a wheelchair)?: A Little Help needed standing up from a chair using your arms (e.g., wheelchair or bedside chair)?: A Little Help needed to walk in hospital room?: A Little Help needed climbing 3-5 steps with a railing? : A Little 6 Click Score: 19    End of Session Equipment Utilized During Treatment: Gait belt Activity Tolerance: Patient tolerated treatment well Patient left: in chair;with call bell/phone within reach;with chair alarm set Nurse Communication: Mobility status PT Visit Diagnosis: History of falling (Z91.81);Unsteadiness on feet (R26.81)     Time: 4287-6811 PT Time Calculation (min) (ACUTE ONLY): 26 min  Charges:  $Gait Training: 23-37 mins                     Dawn W,PT Acute Rehabilitation Services Pager:  [phone]  Office:  [phone]     Berline Lopes 05/20/2020, 9:51 AM

## 2020-05-20 NOTE — Plan of Care (Signed)

## 2020-05-20 NOTE — Progress Notes (Signed)
NAME:  Jeanette Barnes, MRN:  [id number], DOB:  [birth year], LOS: 1 ADMISSION DATE:  05/18/2020, CONSULTATION DATE:  05/19/2020 REFERRING MD:  Dr. Loney Loh, [facility], CHIEF COMPLAINT:  Dizzy  Brief History:  65 yo female woke up in AM of 05/18/20 with lethargy, and dizziness.  In evening she had vomiting episode and fell to her left side in the bathroom.  Her daughter called EMS.  Found to have acute ischemic infarcts of Lt cerebellum, cerebellar vermis, Rt temporoccipital region, and Lt midbrain/pons.  Admitted to ICU for monitoring.  Past Medical History:  HTN, HLD, Vit D deficiency, Pulmonary nodule, A1AT SS, Anxiety  Significant Hospital Events:  3/06 Admit  Consults:  Neurology  Procedures:    Significant Diagnostic Tests:   MRI brain 3/07 >> acute ischemic infarcts of Lt cerebellum, cerebellar vermis, Rt temporoccipital region, and Lt midbrain/pons.  MRA head/neck 3/07 >> occlusion of Lt vertebral artery with concern for acute dissection  Echo 3/07 >> EF 60 to 65%, mild LVH, grade 1 DD, mild RV enlargement, RVSP 26.6 mmHg  Micro Data:  COVID 3/06 >> negative Flu PCR 3/06 >> negative MRSA PCR 3/06 >> negative  Antimicrobials:    Interim History / Subjective:  Back of her neck feels sore.  Feels hungry.  Denies chest pain, dyspnea, nausea, dizziness.  Objective   Blood pressure (!) 135/53, pulse 63, temperature 97.9 F (36.6 C), temperature source Axillary, resp. rate 13, weight 61.7 kg, SpO2 99 %.        Intake/Output Summary (Last 24 hours) at 05/20/2020 0759 Last data filed at 05/20/2020 0500 Gross per 24 hour  Intake 221.6 ml  Output 1125 ml  Net -903.4 ml   Filed Weights   05/20/20 0500  Weight: 61.7 kg    Examination:  General - alert Eyes - pupils reactive ENT - no sinus tenderness, no stridor Cardiac - regular rate/rhythm, no murmur Chest - equal breath sounds b/l, no wheezing or rales Abdomen - soft, non tender, + bowel sounds Extremities - no  cyanosis, clubbing, or edema Skin - no rashes Neuro - normal strength, moves extremities, follows commands Psych - normal mood and behavior   Resolved Hospital Problem list     Assessment & Plan:   Acute ischemic infarcts of Lt cerebellum, cerebellar vermis, Rt temporoccipital region, and Lt midbrain/pons with 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 - per neurology - PT/OT recommending CIR  Hx of hypertension with chronic diastolic CHF, HLD. - goal SBP < 160 - start atorvastatin  A1AT SS. - most recent PFT normal - followed by Dr. Isaiah Serge with [facility]  Vitamin D deficiency. - continue vitamin D  Best practice (evaluated daily)  Diet: heart healthy diet DVT prophylaxis: SCDs GI prophylaxis: Protonix Mobility: as tolerated Disposition: to be determined after neurology assessment Code Status: full  Labs    CMP Latest Ref Rng & Units 05/20/2020 05/18/2020 12/06/2019  Glucose 70 - 99 mg/dL 87 875(I) 90  BUN 8 - 23 mg/dL 13 12 13   Creatinine 0.44 - 1.00 mg/dL 4.33 2.95  Sodium 135 - 145 mmol/L 139 137 141  Potassium 3.5 - 5.1 mmol/L 4.4 2.7(LL) 3.8  Chloride 98 - 111 mmol/L 105 101 103  CO2 22 - 32 mmol/L 27 25 32  Calcium 8.9 - 10.3 mg/dL 9.3 9.2 9.7  Total Protein 6.0 - 8.3 g/dL - - 7.2  Total Bilirubin 0.2 - 1.2 mg/dL - - 0.3  Alkaline Phos 39 - 117 U/L - -  71  AST 0 - 37 U/L - - 25  ALT 0 - 35 U/L - - 25    CBC Latest Ref Rng & Units 05/20/2020 05/19/2020 05/18/2020  WBC 4.0 - 10.5 K/uL 6.7 10.8(H) 11.6(H)  Hemoglobin 12.0 - 15.0 g/dL 46.2 19.4 71.2  Hematocrit 36.0 - 46.0 % 37.5 36.4 38.6  Platelets 150 - 400 K/uL 196 215 206    CBG (last 3)  Recent Labs    05/19/20 0704  GLUCAP 84   Signature:  [name], MD [facility] Pulmonary/Critical Care Pager - [phone] 05/20/2020, 8:14 AM

## 2020-05-20 NOTE — Plan of Care (Signed)
  Problem: Education: Goal: Knowledge of General Education information will improve Description: Including pain rating scale, medication(s)/side effects and non-pharmacologic comfort measures Outcome: Progressing   Problem: Health Behavior/Discharge Planning: Goal: Ability to manage health-related needs will improve Outcome: Progressing   Problem: Clinical Measurements: Goal: Ability to maintain clinical measurements within normal limits will improve Outcome: Progressing Goal: Will remain free from infection Outcome: Progressing Goal: Diagnostic test results will improve Outcome: Progressing Goal: Respiratory complications will improve Outcome: Progressing Goal: Cardiovascular complication will be avoided Outcome: Progressing   Problem: Activity: Goal: Risk for activity intolerance will decrease Outcome: Progressing   Problem: Nutrition: Goal: Adequate nutrition will be maintained Outcome: Progressing   Problem: Coping: Goal: Level of anxiety will decrease Outcome: Progressing   Problem: Elimination: Goal: Will not experience complications related to bowel motility Outcome: Progressing Goal: Will not experience complications related to urinary retention Outcome: Progressing   Problem: Pain Managment: Goal: General experience of comfort will improve Outcome: Progressing   Problem: Safety: Goal: Ability to remain free from injury will improve Outcome: Progressing   Problem: Skin Integrity: Goal: Risk for impaired skin integrity will decrease Outcome: Progressing   Problem: Education: Goal: Knowledge of secondary prevention will improve Outcome: Progressing Goal: Knowledge of patient specific risk factors addressed and post discharge goals established will improve Outcome: Progressing Goal: Individualized Educational Video(s) Outcome: Progressing   

## 2020-05-21 ENCOUNTER — Inpatient Hospital Stay (HOSPITAL_COMMUNITY): Payer: Medicare HMO

## 2020-05-21 DIAGNOSIS — I63112 Cerebral infarction due to embolism of left vertebral artery: Secondary | ICD-10-CM | POA: Diagnosis not present

## 2020-05-21 HISTORY — PX: IR ANGIO VERTEBRAL SEL SUBCLAVIAN INNOMINATE UNI L MOD SED: IMG5364

## 2020-05-21 HISTORY — PX: IR ANGIO VERTEBRAL SEL VERTEBRAL UNI R MOD SED: IMG5368

## 2020-05-21 HISTORY — PX: IR US GUIDE VASC ACCESS RIGHT: IMG2390

## 2020-05-21 HISTORY — PX: IR ANGIO EXTERNAL CAROTID SEL EXT CAROTID BILAT MOD SED: IMG5372

## 2020-05-21 LAB — PROTIME-INR
INR: 1.1 (ref 0.8–1.2)
Prothrombin Time: 13.3 seconds (ref 11.4–15.2)

## 2020-05-21 MED ORDER — VERAPAMIL HCL 2.5 MG/ML IV SOLN
INTRAVENOUS | Status: AC | PRN
  Administered 2020-05-21: 2.5 mg via INTRAVENOUS

## 2020-05-21 MED ORDER — ATORVASTATIN CALCIUM 80 MG PO TABS
80.0000 mg | ORAL_TABLET | Freq: Every day | ORAL | Status: DC
  Administered 2020-05-21 – 2020-05-23 (×3): 80 mg via ORAL
  Filled 2020-05-21 (×3): qty 1

## 2020-05-21 MED ORDER — MIDAZOLAM HCL 2 MG/2ML IJ SOLN
INTRAMUSCULAR | Status: AC | PRN
  Administered 2020-05-21: 1 mg via INTRAVENOUS

## 2020-05-21 MED ORDER — LIDOCAINE HCL (PF) 1 % IJ SOLN
INTRAMUSCULAR | Status: AC | PRN
  Administered 2020-05-21: 30 mL

## 2020-05-21 MED ORDER — LIDOCAINE HCL 1 % IJ SOLN
INTRAMUSCULAR | Status: AC
  Filled 2020-05-21: qty 20

## 2020-05-21 MED ORDER — NITROGLYCERIN 1 MG/10 ML FOR IR/CATH LAB
INTRA_ARTERIAL | Status: AC
  Filled 2020-05-21: qty 10

## 2020-05-21 MED ORDER — MIDAZOLAM HCL 2 MG/2ML IJ SOLN
INTRAMUSCULAR | Status: AC
  Filled 2020-05-21: qty 2

## 2020-05-21 MED ORDER — HEPARIN SODIUM (PORCINE) 1000 UNIT/ML IJ SOLN
INTRAMUSCULAR | Status: AC
  Filled 2020-05-21: qty 1

## 2020-05-21 MED ORDER — IOHEXOL 300 MG/ML  SOLN
150.0000 mL | Freq: Once | INTRAMUSCULAR | Status: AC | PRN
  Administered 2020-05-21: 125 mL via INTRA_ARTERIAL

## 2020-05-21 MED ORDER — FENTANYL CITRATE (PF) 100 MCG/2ML IJ SOLN
INTRAMUSCULAR | Status: AC
  Filled 2020-05-21: qty 2

## 2020-05-21 MED ORDER — SODIUM CHLORIDE 0.9 % IV SOLN: INTRAVENOUS | Status: AC

## 2020-05-21 MED ORDER — VERAPAMIL HCL 2.5 MG/ML IV SOLN
INTRAVENOUS | Status: AC
  Filled 2020-05-21: qty 2

## 2020-05-21 MED ORDER — FENTANYL CITRATE (PF) 100 MCG/2ML IJ SOLN
INTRAMUSCULAR | Status: AC | PRN
  Administered 2020-05-21: 25 ug via INTRAVENOUS

## 2020-05-21 MED ORDER — HEPARIN SODIUM (PORCINE) 1000 UNIT/ML IJ SOLN
INTRAMUSCULAR | Status: AC | PRN
  Administered 2020-05-21: 2000 [IU] via INTRA_ARTERIAL

## 2020-05-21 MED ORDER — NITROGLYCERIN 1 MG/10 ML FOR IR/CATH LAB
INTRA_ARTERIAL | Status: AC | PRN
  Administered 2020-05-21: 200 ug via INTRA_ARTERIAL

## 2020-05-21 NOTE — Procedures (Signed)
S/p 4 vessel cerebral artreriogram RT CFA and RT rad approach. Findings. 1.Occluded Lt VA at origin  without distal reconstitution. 2.Lt VBJ  Fills retrogradely from RT VA. 3.30 % stenosis of prox RT VA. S.Malayasia Mirkin MD

## 2020-05-21 NOTE — Sedation Documentation (Signed)
Right femoral arterial sheath removed, exoseal closure device deployed to right groin site.

## 2020-05-21 NOTE — Progress Notes (Signed)
PT Cancellation Note  Patient Details Name: Jeanette Barnes MRN: [id number] DOB: [birth year]   Cancelled Treatment:    Reason Eval/Treat Not Completed: Medical issues which prohibited therapy (Per nurse pt going for angiogram.  Will be performed femorally and pt will be on bedrest after the procedure.  Will return tomorrow.)  [facility] M,PT Acute Rehab Services [phone] (pager) Jeanette Barnes 05/21/2020, 12:20 PM

## 2020-05-21 NOTE — Progress Notes (Signed)
NAME:  Jeanette Barnes, MRN:  [id number], DOB:  Sep 25, 1955, LOS: 2 ADMISSION DATE:  05/18/2020, CONSULTATION DATE:  05/19/2020 REFERRING MD:  Dr. Loney Loh, [facility], CHIEF COMPLAINT:  Dizzy  Brief History:  65 yo female woke up in AM of 05/18/20 with lethargy, and dizziness.  In evening she had vomiting episode and fell to her left side in the bathroom.  Her daughter called EMS.  Found to have acute ischemic infarcts of Lt cerebellum, cerebellar vermis, Rt temporoccipital region, and Lt midbrain/pons.  Admitted to ICU for monitoring.  Past Medical History:  HTN, HLD, Vit D deficiency, Pulmonary nodule, A1AT SS, Anxiety  Significant Hospital Events:  3/06 Admit  Consults:  Neurology Neuro IR  Procedures:    Significant Diagnostic Tests:   MRI brain 3/07 >> acute ischemic infarcts of Lt cerebellum, cerebellar vermis, Rt temporoccipital region, and Lt midbrain/pons.  MRA head/neck 3/07 >> occlusion of Lt vertebral artery with concern for acute dissection  Echo 3/07 >> EF 60 to 65%, mild LVH, grade 1 DD, mild RV enlargement, RVSP 26.6 mmHg  Micro Data:  COVID 3/06 >> negative Flu PCR 3/06 >> negative MRSA PCR 3/06 >> negative  Antimicrobials:    Interim History / Subjective:  Walked in hall.  Headache/neck pain better.  Mild dizziness when standing at first.  Denies chest pain, nausea, abdominal pain, dyspnea.  Objective   Blood pressure (!) 112/59, pulse 73, temperature 98.1 F (36.7 C), temperature source Oral, resp. rate 19, weight 61.7 kg, SpO2 96 %.        Intake/Output Summary (Last 24 hours) at 05/21/2020 0835 Last data filed at 05/20/2020 2000 Gross per 24 hour  Intake -  Output 375 ml  Net -375 ml   Filed Weights   05/20/20 0500  Weight: 61.7 kg    Examination:  General - alert Eyes - pupils reactive ENT - no sinus tenderness, no stridor Cardiac - regular rate/rhythm, no murmur Chest - equal breath sounds b/l, no wheezing or rales Abdomen - soft, non tender,  + bowel sounds Extremities - no cyanosis, clubbing, or edema Skin - no rashes Neuro - normal strength, moves extremities, follows commands Psych - normal mood and behavior  Resolved Hospital Problem list     Assessment & Plan:   Acute ischemic infarcts of Lt cerebellum, cerebellar vermis, Rt temporoccipital region, and Lt midbrain/pons with 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 - for arteriogram with neuro IR on 3/9 - neurology consulted - PT/OT recommending CIR  Hx of hypertension with chronic diastolic CHF, HLD. - goal SBP < 160 - continue atorvastatin  A1AT SS. - most recent PFT normal - followed by Dr. Isaiah Serge with [place]  Vitamin D deficiency. - continue vitamin D  D/w Dr. Herminio Heads practice (evaluated daily)  Diet: heart healthy diet DVT prophylaxis: SCDs GI prophylaxis: Protonix Mobility: as tolerated Disposition: keep in ICU until arteriogram complete, and then reassess Code Status: full  Labs    CMP Latest Ref Rng & Units 05/20/2020 05/18/2020 12/06/2019  Glucose 70 - 99 mg/dL 87 175(Z) 90  BUN 8 - 23 mg/dL 13 12 13   Creatinine 0.44 - 1.00 mg/dL 0.25 8.52  Sodium 135 - 145 mmol/L 139 137 141  Potassium 3.5 - 5.1 mmol/L 4.4 2.7(LL) 3.8  Chloride 98 - 111 mmol/L 105 101 103  CO2 22 - 32 mmol/L 27 25 32  Calcium 8.9 - 10.3 mg/dL 9.3 9.2 9.7  Total Protein 6.0 - 8.3 g/dL - -  7.2  Total Bilirubin 0.2 - 1.2 mg/dL - - 0.3  Alkaline Phos 39 - 117 U/L - - 71  AST 0 - 37 U/L - - 25  ALT 0 - 35 U/L - - 25    CBC Latest Ref Rng & Units 05/20/2020 05/19/2020 05/18/2020  WBC 4.0 - 10.5 K/uL 6.7 10.8(H) 11.6(H)  Hemoglobin 12.0 - 15.0 g/dL 20.8 02.2 33.6  Hematocrit 36.0 - 46.0 % 37.5 36.4 38.6  Platelets 150 - 400 K/uL 196 215 206    CBG (last 3)  Recent Labs    05/19/20 0704  GLUCAP 84   Signature:  [name], MD [facility] Pulmonary/Critical Care Pager - [phone] 05/21/2020, 8:35 AM

## 2020-05-21 NOTE — Progress Notes (Signed)
Occupational Therapy Treatment Patient Details Name: Jeanette Barnes MRN: [id number] DOB: 11-29-1955 Today's Date: 05/21/2020    History of present illness Jeanette Barnes is a 65 y.o. female with medical history significant of hypertension, hyperlipidemia presenting to the ED for evaluation of vertigo and ataxia. Fell with possibly hitting her head. MRI showing patchy acute ischemic nonhemorrhagic infarcts involving the left cerebellum, cerebellar vermis, right temporal occipital region, and left midbrain/pons.MRA head and neck showing findings concerning for acute extracranial vertebral artery dissection   OT comments  Pt making progress toward goals set this week. Pt continues to have deficits in area of balance and functional mobiilty due to dizziness when she looks down. Pt with mild nystagmus noted when looking to the Right.  When moving head back and forth when walking, pt did become mildly dizzy with resulting shuffled gait which makes her a fall concern. Pt was suppose to start new job today and would like to keep that job. Feel if pt went to rehab for short time, she could d/c home independently. Pt did seem to have more insight into her deficits today.  Will continue to follow with focus on home tasks and adls on her feet.   Follow Up Recommendations  CIR;Supervision - Intermittent    Equipment Recommendations  Other (comment)    Recommendations for Other Services      Precautions / Restrictions Precautions Precautions: Fall Precaution Comments: Pt with dizziness when moving head to left and when looking down. Restrictions Weight Bearing Restrictions: No       Mobility Bed Mobility Overal bed mobility: Needs Assistance Bed Mobility: Supine to Sit;Sit to Supine     Supine to sit: Min assist Sit to supine: Min assist   General bed mobility comments: min assist to get legs fully into bed.    Transfers Overall transfer level: Needs assistance Equipment used: 1 person  hand held assist Transfers: Sit to/from [employer] Sit to Stand: Min assist Stand pivot transfers: Min assist       General transfer comment: initial stand-pivot with min assist +1 without device to 3N1; with RW min assist +1 to stand as pt needing steadying assist initially    Balance Overall balance assessment: Needs assistance Sitting-balance support: Feet supported Sitting balance-Leahy Scale: Good     Standing balance support: Bilateral upper extremity supported Standing balance-Leahy Scale: Poor Standing balance comment: Pt reliant on outside support to remain in standing due to dizziness when looking down or when turning head to the Left.  Pt takes small short steps when she becomes dizzy making pt a fall risk.                           ADL either performed or assessed with clinical judgement   ADL Overall ADL's : Needs assistance/impaired     Grooming: Wash/dry hands;Oral care;Minimal assistance;Standing Grooming Details (indicate cue type and reason): Pt stood at sink to groom. pt required assist with balance wtih one LOB to the left with min assist to recover. Pt reports feeling dizzy when she looks down.         Upper Body Dressing : Minimal assistance;Sitting Upper Body Dressing Details (indicate cue type and reason): pt donned gown at EOB with min assist to tie gown. Lower Body Dressing: Minimal assistance;Sit to/from stand;Cueing for compensatory techniques Lower Body Dressing Details (indicate cue type and reason): Pt required min assist to come to standing and required min assist to maintain  balance in standing while managng clothing. Toilet Transfer: Moderate assistance;Ambulation Toilet Transfer Details (indicate cue type and reason): Pt walked to bathroom with mod assist (min assist execept one LOB requiring mod assist to recover). Toileting- Clothing Manipulation and Hygiene: Min guard;Sitting/lateral lean Toileting - Clothing  Manipulation Details (indicate cue type and reason): +1 assist to stand to clean self.  Otherwise cleaned self in sitting with min guard.     Functional mobility during ADLs: Minimal assistance General ADL Comments: Pt doing better wtih adls this am but continues to require min assist due to decreased balance, L side incoordination and mild impulsivity.     Vision   Vision Assessment?: Yes Eye Alignment: Within Functional Limits Ocular Range of Motion: Within Functional Limits Alignment/Gaze Preference: Within Defined Limits Tracking/Visual Pursuits: Decreased smoothness of horizontal tracking;Decreased smoothness of eye movement to RIGHT superior field Saccades: Within functional limits Convergence: Within functional limits Visual Fields: No apparent deficits Diplopia Assessment: Other (comment) (reports diplopia in her past but not today.) Additional Comments: wears glasses for distance and driving.   Perception     Praxis      Cognition Arousal/Alertness: Awake/alert Behavior During Therapy: WFL for tasks assessed/performed   Area of Impairment: Safety/judgement;Awareness;Problem solving                         Safety/Judgement: Decreased awareness of safety;Decreased awareness of deficits Awareness: Emergent Problem Solving: Requires verbal cues;Requires tactile cues General Comments: Pt(compared to notes from yesterday) appears to answer questions more quickly and more sure of answeres.        Exercises     Shoulder Instructions       General Comments Pt continues to be limited by balance when up on her feet.  Pt lives alone.    Pertinent Vitals/ Pain       Pain Assessment: No/denies pain  Home Living                                          Prior Functioning/Environment              Frequency  Min 2X/week        Progress Toward Goals  OT Goals(current goals can now be found in the care plan section)  Progress  towards OT goals: Progressing toward goals  Acute Rehab OT Goals Patient Stated Goal: to be able to work (was suppose to start a job in 2 days) OT Goal Formulation: With patient Time For Goal Achievement: 06/02/20 Potential to Achieve Goals: Good ADL Goals Pt Will Perform Grooming: Independently;standing Pt Will Perform Upper Body Bathing: Independently;sitting Pt Will Perform Lower Body Bathing: Independently;sit to/from stand Pt Will Perform Upper Body Dressing: Independently;sitting Pt Will Perform Lower Body Dressing: Independently;sit to/from stand Pt Will Transfer to Toilet: with modified independence;ambulating;regular height toilet;grab bars Pt Will Perform Toileting - Clothing Manipulation and hygiene: Independently;sit to/from stand Additional ADL Goal #1: Pt will be Independent in and OOB for basic ADLs Additional ADL Goal #2: Pt will be able to perform vision exercises to help combat her double/blurry vision independently  Plan Discharge plan remains appropriate    Co-evaluation                 AM-PAC OT "6 Clicks" Daily Activity     Outcome Measure   Help from another person eating meals?: A Little Help  from another person taking care of personal grooming?: A Little Help from another person toileting, which includes using toliet, bedpan, or urinal?: A Little Help from another person bathing (including washing, rinsing, drying)?: A Little Help from another person to put on and taking off regular upper body clothing?: A Little Help from another person to put on and taking off regular lower body clothing?: A Little 6 Click Score: 18    End of Session    OT Visit Diagnosis: Unsteadiness on feet (R26.81);Other abnormalities of gait and mobility (R26.89);Muscle weakness (generalized) (M62.81);Ataxia, unspecified (R27.0);Low vision, both eyes (H54.2);Other symptoms and signs involving cognitive function;Hemiplegia and hemiparesis Hemiplegia - Right/Left:  Left Hemiplegia - dominant/non-dominant: Non-Dominant Hemiplegia - caused by: Cerebral infarction   Activity Tolerance Patient tolerated treatment well   Patient Left in bed;with call bell/phone within reach   Nurse Communication Mobility status        Time: 1021-1050 OT Time Calculation (min): 29 min  Charges: OT General Charges $OT Visit: 1 Visit OT Treatments $Self Care/Home Management : 23-37 mins   Hope Budds 05/21/2020, 11:08 AM

## 2020-05-21 NOTE — Consult Note (Signed)
Chief Complaint: Patient was seen in consultation today for CVA/diagnostic cerebral arteriogram.  Referring Physician(s): * No referring provider recorded for this case *  Supervising Physician: Julieanne Cotton  Patient Status: [facility] - In-pt  History of Present Illness: Jeanette Barnes is a 65 y.o. female with a past medical history of hypertension, hypercholesterolemia, MVP, pulmonary nodule, ATT deficiency, and anxiety. She presented to [facility] ED 05/18/2020 with complaints of dizziness and balance issues. Per notes, patient woke up 05/18/2020 with a headache,felt dizzy, and vomited. During this, she was unsteady on her feet, and fell hitting her head on a trash can. In ED, MR brain revealed acute CVA and MRA head/neck concerning for possible left VA occlusion/dissection. She was admitted for further management. Neurology was consulted who recommended NIR consult for possible diagnostic cerebral arteriogram for further evaluation of left VA.  MR brain 05/18/2020: 1. Patchy acute ischemic nonhemorrhagic infarcts involving the left cerebellum, cerebellar vermis, right temporoccipital region, and left midbrain/pons. 2. Otherwise normal brain MRI for age.  MRA head/neck 05/18/2020: 1. Occlusion of the left vertebral artery with concern for acute dissection as below. Attenuated irregular flow within the left V4 segment distally may in part be retrograde in nature. Dominant right vertebral artery widely patent. 2. Poor visualization of the left SCA, which also may be partially occluded given the distribution of infarcts. 3. Widely patent anterior circulation. 1. 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 Given the presence of acute posterior circulation infarcts, these findings raise the possibility for an acute arterial dissection. Correlation with dedicated CTA could be performed for further  evaluation as warranted. 2. Short-segment moderate approximate 50% stenosis involving the pre foraminal right V1 segment. Otherwise wide patency of the dominant right vertebral artery. 3. Mild for age atheromatous change about the carotid bifurcations without significant stenosis. Otherwise wide patency of both carotid artery systems in the neck.  NIR consulted by Dr. Wilford Corner for possible image-guided diagnostic cerebral arteriogram. Patient awake and alert laying in bed. Accompanied by daughter via speaker phone. Complains of posterior headache, rated 1/10 at this time. Denies vision changes or N/V associated with headache. States dizziness/gait instability that brought her to [facility] has subsided at this time. Denies fever, chills, chest pain, dyspnea, or abdominal pain.  Currently on DAPT (Plavix/Aspirin).   Past Medical History:  Diagnosis Date  . AAT (alpha-1-antitrypsin) deficiency (HCC)   . Anxiety   . Heart murmur    mitral valve prolapse  . Hypercholesteremia   . Hypertension   . Pulmonary nodule   . Vitamin D deficiency     Past Surgical History:  Procedure Laterality Date  . ABDOMINAL HYSTERECTOMY    . BREAST BIOPSY    . BREAST EXCISIONAL BIOPSY Bilateral 1990's   2 Left, 1 Right, all benign    Allergies: Patient has no known allergies.  Medications: Prior to Admission medications   Medication Sig Start Date End Date Taking? Authorizing Provider  acetaminophen (TYLENOL) 500 MG tablet Take 1,000 mg by mouth every 6 (six) hours as needed for mild pain or headache.   Yes [provider]  albuterol (VENTOLIN HFA) 108 (90 Base) MCG/ACT inhaler Inhale 2 puffs into the lungs every 6 (six) hours as needed for wheezing or shortness of breath. 12/06/19  Yes Mannam, Praveen, MD  Biotin 1 MG CAPS Take 1 mg by mouth daily.   Yes [provider]  cholecalciferol (VITAMIN  D) 400 units TABS tablet Take 400 Units by mouth.   Yes [provider]   hydrochlorothiazide (HYDRODIURIL) 25 MG tablet Take 25 mg by mouth daily. 10/12/16  Yes [provider]  naproxen sodium (ALEVE) 220 MG tablet Take 220 mg by mouth daily as needed (pain/headace).   Yes [provider]  OVER THE COUNTER MEDICATION Take 1 tablet by mouth daily. Stress relief gummy---olly brand   Yes [provider]  vitamin B-12 (CYANOCOBALAMIN) 1000 MCG tablet Take 1,000 mcg by mouth daily.   Yes [provider]  VITAMIN E PO Take 1 tablet by mouth as needed.   Yes [provider]     Family History  Problem Relation Age of Onset  . Emphysema Father   . Hypertension Brother   . Kidney disease Brother     Social History   Socioeconomic History  . Marital status: Single    Spouse name: Not on file  . Number of children: Not on file  . Years of education: Not on file  . Highest education level: Not on file  Occupational History  . Not on file  Tobacco Use  . Smoking status: Never Smoker  . Smokeless tobacco: Never Used  Vaping Use  . Vaping Use: Never used  Substance and Sexual Activity  . Alcohol use: No  . Drug use: No  . Sexual activity: Not on file  Other Topics Concern  . Not on file  Social History Narrative  . Not on file   Social Determinants of Health   Financial Resource Strain: Not on file  Food Insecurity: Not on file  Transportation Needs: Not on file  Physical Activity: Not on file  Stress: Not on file  Social Connections: Not on file     Review of Systems: A 12 point ROS discussed and pertinent positives are indicated in the HPI above.  All other systems are negative.  Review of Systems  Constitutional: Negative for chills and fever.  Eyes: Negative for visual disturbance.  Respiratory: Negative for shortness of breath and wheezing.   Cardiovascular: Negative for chest pain and palpitations.  Gastrointestinal: Negative for abdominal pain, nausea and vomiting.  Neurological: Positive for  headaches. Negative for dizziness.  Psychiatric/Behavioral: Negative for behavioral problems and confusion.    Vital Signs: BP 129/70 (BP Location: Right Arm)   Pulse 70   Temp 97.9 F (36.6 C) (Tympanic)   Resp 18   Wt 136 lb 0.4 oz (61.7 kg)   SpO2 98%   BMI 23.72 kg/m   Physical Exam Vitals and nursing note reviewed.  Constitutional:      General: She is not in acute distress.    Appearance: Normal appearance.  Cardiovascular:     Rate and Rhythm: Normal rate and regular rhythm.     Heart sounds: Normal heart sounds. No murmur heard.   Pulmonary:     Effort: Pulmonary effort is normal. No respiratory distress.     Breath sounds: Normal breath sounds. No wheezing.  Skin:    General: Skin is warm and dry.  Neurological:     Mental Status: She is alert and oriented to person, place, and time.     Comments: Can spontaneously move all extremities.      MD Evaluation Airway: WNL Heart: WNL Abdomen: WNL Chest/ Lungs: WNL ASA  Classification: 3 Mallampati/Airway Score: Two   Imaging: MR ANGIO HEAD WO CONTRAST  Result Date: 05/19/2020 CLINICAL DATA:  Initial evaluation for neuro deficit, stroke  suspected, vertigo. EXAM: MRI HEAD WITHOUT CONTRAST MRA HEAD WITHOUT CONTRAST MRA NECK WITHOUT AND WITH CONTRAST TECHNIQUE: Multiplanar, multiecho pulse sequences of the brain and surrounding structures were obtained without intravenous contrast. Angiographic images of the Circle of Willis were obtained using MRA technique without intravenous contrast. Angiographic images of the neck were obtained using MRA technique without and with intravenous contrast. Carotid stenosis measurements (when applicable) are obtained utilizing NASCET criteria, using the distal internal carotid diameter as the denominator. CONTRAST:  6.65mL GADAVIST GADOBUTROL 1 MMOL/ML IV SOLN COMPARISON:  Prior CT from 03/30/2018. FINDINGS: MRI HEAD FINDINGS Brain: Cerebral volume within normal limits for patient age.  No significant cerebral white matter disease for age. Patchy multifocal areas of restricted diffusion seen involving the left cerebellum, most pronounced inferiorly. Minimal patchy involvement of the central cerebellar vermis. Additional patchy small volume areas of restricted diffusion seen involving the paramedian right temporal occipital region. Mild diffusion abnormality also noted at the left midbrain/pons. No associated hemorrhage or mass effect. Otherwise, gray-white matter differentiation well maintained. No other areas of chronic cortical infarction. No foci of susceptibility artifact to suggest acute or chronic intracranial hemorrhage. No mass lesion, midline shift or mass effect. No hydrocephalus or extra-axial fluid collection. Pituitary gland suprasellar region normal. Midline structures intact. Vascular: Major intracranial vascular flow voids are maintained. Skull and upper cervical spine: Craniocervical junction within normal limits. Bone marrow signal intensity normal. No scalp soft tissue abnormality. Reversal of the normal upper cervical lordosis noted. Sinuses/Orbits: Globes and orbital soft tissues demonstrate no acute finding. Paranasal sinuses are clear. No mastoid effusion. Inner ear structures grossly normal. Other: None. MRA HEAD FINDINGS ANTERIOR CIRCULATION: Visualized distal cervical segments are both widely patent with antegrade flow. Petrous, cavernous, and supraclinoid segments patent without stenosis or other abnormality. A1 segments widely patent. Normal anterior communicating artery complex. Anterior cerebral arteries patent to their distal aspects without stenosis. No M1 stenosis or occlusion. Normal MCA bifurcations. Distal MCA branches well perfused and symmetric. POSTERIOR CIRCULATION: Dominant right vertebral artery widely patent to the vertebrobasilar junction. Right PICA patent and well perfused. Faint flow seen within the partially visualized distal left vertebral artery,  with relatively absent flow within the left V3 segment. The left vertebral artery appears occluded as it courses into the cranial vault. Attenuated irregular flow seen within the left V4 segment distally, and remains grossly patent to the vertebrobasilar junction. Left PICA grossly patent at its origin. Basilar patent to its distal aspect without stenosis or visible abnormality. Right superior cerebral artery widely patent. Left SCA faintly visible and appears grossly patent on time-of-flight sequence, not seen on MIP reconstructions. Both PCAs primarily supplied via the basilar. PCAs appear well perfused to their distal aspects. No intracranial aneurysm. MRA NECK FINDINGS AORTIC ARCH: Visualized aortic arch normal in caliber with normal 3 vessel morphology. No hemodynamically significant stenosis seen about the origin of the great vessels. RIGHT CAROTID SYSTEM: Right CCA patent from its origin to the bifurcation without stenosis. Mild for age atheromatous irregularity about the right carotid bulb with no more than mild 20-25% stenosis by NASCET criteria. Right ICA patent distally without stenosis, evidence for dissection or occlusion. LEFT CAROTID SYSTEM: Left common carotid artery widely patent from its origin to the bifurcation. Mild for age atheromatous irregularity about the left carotid bulb with no more than mild 20-25% by NASCET criteria. Left ICA otherwise patent without stenosis, evidence for dissection, or occlusion. Minimal irregularity about the mid cervical left ICA noted. VERTEBRAL ARTERIES: Both vertebral arteries  arise from the subclavian arteries. Right vertebral artery likely dominant. There is a short-segment moderate approximate 50% stenosis involving the pre foraminal right V1 segment (series 1058, image 10). Otherwise, the right vertebral artery is widely patent within the neck without stenosis, evidence for dissection, or occlusion. On the left, the left vertebral artery appears occluded at  its origin (series 18, image 32). Distally, the left ICA is markedly attenuated and diminutive, with near occlusion at its distal V2 segment. Some flow is seen within the left V3 segment, but is also markedly attenuated and irregular. Given the presence of acute posterior circulation infarcts, finding raises the possibility for an acute dissection. IMPRESSION: MRI HEAD IMPRESSION: 1. Patchy acute ischemic nonhemorrhagic infarcts involving the left cerebellum, cerebellar vermis, right temporoccipital region, and left midbrain/pons. 2. Otherwise normal brain MRI for age. MRA HEAD IMPRESSION: 1. Occlusion of the left vertebral artery with concern for acute dissection as below. Attenuated irregular flow within the left V4 segment distally may in part be retrograde in nature. Dominant right vertebral artery widely patent. 2. Poor visualization of the left SCA, which also may be partially occluded given the distribution of infarcts. 3. Widely patent anterior circulation. MRA NECK IMPRESSION: 1. 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 Given the presence of acute posterior circulation infarcts, these findings raise the possibility for an acute arterial dissection. Correlation with dedicated CTA could be performed for further evaluation as warranted. 2. Short-segment moderate approximate 50% stenosis involving the pre foraminal right V1 segment. Otherwise wide patency of the dominant right vertebral artery. 3. Mild for age atheromatous change about the carotid bifurcations without significant stenosis. Otherwise wide patency of both carotid artery systems in the neck. Critical Value/emergent results were called by telephone at the time of interpretation on 05/19/2020 at 1:34 a.m. to provider Dr. Marily Memos, who verbally acknowledged these results. Electronically Signed   By: Rise Mu M.D.   On:  05/19/2020 01:56   MR ANGIO NECK W WO CONTRAST  Result Date: 05/19/2020 CLINICAL DATA:  Initial evaluation for neuro deficit, stroke suspected, vertigo. EXAM: MRI HEAD WITHOUT CONTRAST MRA HEAD WITHOUT CONTRAST MRA NECK WITHOUT AND WITH CONTRAST TECHNIQUE: Multiplanar, multiecho pulse sequences of the brain and surrounding structures were obtained without intravenous contrast. Angiographic images of the Circle of Willis were obtained using MRA technique without intravenous contrast. Angiographic images of the neck were obtained using MRA technique without and with intravenous contrast. Carotid stenosis measurements (when applicable) are obtained utilizing NASCET criteria, using the distal internal carotid diameter as the denominator. CONTRAST:  6.55mL GADAVIST GADOBUTROL 1 MMOL/ML IV SOLN COMPARISON:  Prior CT from 03/30/2018. FINDINGS: MRI HEAD FINDINGS Brain: Cerebral volume within normal limits for patient age. No significant cerebral white matter disease for age. Patchy multifocal areas of restricted diffusion seen involving the left cerebellum, most pronounced inferiorly. Minimal patchy involvement of the central cerebellar vermis. Additional patchy small volume areas of restricted diffusion seen involving the paramedian right temporal occipital region. Mild diffusion abnormality also noted at the left midbrain/pons. No associated hemorrhage or mass effect. Otherwise, gray-white matter differentiation well maintained. No other areas of chronic cortical infarction. No foci of susceptibility artifact to suggest acute or chronic intracranial hemorrhage. No mass lesion, midline shift or mass effect. No hydrocephalus or extra-axial fluid collection. Pituitary gland suprasellar region normal. Midline structures intact. Vascular: Major intracranial vascular flow voids are maintained. Skull and upper  cervical spine: Craniocervical junction within normal limits. Bone marrow signal intensity normal. No scalp soft  tissue abnormality. Reversal of the normal upper cervical lordosis noted. Sinuses/Orbits: Globes and orbital soft tissues demonstrate no acute finding. Paranasal sinuses are clear. No mastoid effusion. Inner ear structures grossly normal. Other: None. MRA HEAD FINDINGS ANTERIOR CIRCULATION: Visualized distal cervical segments are both widely patent with antegrade flow. Petrous, cavernous, and supraclinoid segments patent without stenosis or other abnormality. A1 segments widely patent. Normal anterior communicating artery complex. Anterior cerebral arteries patent to their distal aspects without stenosis. No M1 stenosis or occlusion. Normal MCA bifurcations. Distal MCA branches well perfused and symmetric. POSTERIOR CIRCULATION: Dominant right vertebral artery widely patent to the vertebrobasilar junction. Right PICA patent and well perfused. Faint flow seen within the partially visualized distal left vertebral artery, with relatively absent flow within the left V3 segment. The left vertebral artery appears occluded as it courses into the cranial vault. Attenuated irregular flow seen within the left V4 segment distally, and remains grossly patent to the vertebrobasilar junction. Left PICA grossly patent at its origin. Basilar patent to its distal aspect without stenosis or visible abnormality. Right superior cerebral artery widely patent. Left SCA faintly visible and appears grossly patent on time-of-flight sequence, not seen on MIP reconstructions. Both PCAs primarily supplied via the basilar. PCAs appear well perfused to their distal aspects. No intracranial aneurysm. MRA NECK FINDINGS AORTIC ARCH: Visualized aortic arch normal in caliber with normal 3 vessel morphology. No hemodynamically significant stenosis seen about the origin of the great vessels. RIGHT CAROTID SYSTEM: Right CCA patent from its origin to the bifurcation without stenosis. Mild for age atheromatous irregularity about the right carotid bulb  with no more than mild 20-25% stenosis by NASCET criteria. Right ICA patent distally without stenosis, evidence for dissection or occlusion. LEFT CAROTID SYSTEM: Left common carotid artery widely patent from its origin to the bifurcation. Mild for age atheromatous irregularity about the left carotid bulb with no more than mild 20-25% by NASCET criteria. Left ICA otherwise patent without stenosis, evidence for dissection, or occlusion. Minimal irregularity about the mid cervical left ICA noted. VERTEBRAL ARTERIES: Both vertebral arteries arise from the subclavian arteries. Right vertebral artery likely dominant. There is a short-segment moderate approximate 50% stenosis involving the pre foraminal right V1 segment (series 1058, image 10). Otherwise, the right vertebral artery is widely patent within the neck without stenosis, evidence for dissection, or occlusion. On the left, the left vertebral artery appears occluded at its origin (series 18, image 32). Distally, the left ICA is markedly attenuated and diminutive, with near occlusion at its distal V2 segment. Some flow is seen within the left V3 segment, but is also markedly attenuated and irregular. Given the presence of acute posterior circulation infarcts, finding raises the possibility for an acute dissection. IMPRESSION: MRI HEAD IMPRESSION: 1. Patchy acute ischemic nonhemorrhagic infarcts involving the left cerebellum, cerebellar vermis, right temporoccipital region, and left midbrain/pons. 2. Otherwise normal brain MRI for age. MRA HEAD IMPRESSION: 1. Occlusion of the left vertebral artery with concern for acute dissection as below. Attenuated irregular flow within the left V4 segment distally may in part be retrograde in nature. Dominant right vertebral artery widely patent. 2. Poor visualization of the left SCA, which also may be partially occluded given the distribution of infarcts. 3. Widely patent anterior circulation. MRA NECK IMPRESSION: 1. Partial  occlusion of the left vertebral artery at its origin, with severely attenuated and irregular flow distally within the left V2  and V3 segments. Given the presence of acute posterior circulation infarcts, these findings raise the possibility for an acute arterial dissection. Correlation with dedicated CTA could be performed for further evaluation as warranted. 2. Short-segment moderate approximate 50% stenosis involving the pre foraminal right V1 segment. Otherwise wide patency of the dominant right vertebral artery. 3. Mild for age atheromatous change about the carotid bifurcations without significant stenosis. Otherwise wide patency of both carotid artery systems in the neck. Critical Value/emergent results were called by telephone at the time of interpretation on 05/19/2020 at 1:34 a.m. to provider Dr. Marily MemosJason Mesner, who verbally acknowledged these results. Electronically Signed   By: Rise MuBenjamin  McClintock M.D.   On: 05/19/2020 01:56   MR BRAIN WO CONTRAST  Result Date: 05/19/2020 CLINICAL DATA:  Initial evaluation for neuro deficit, stroke suspected, vertigo. EXAM: MRI HEAD WITHOUT CONTRAST MRA HEAD WITHOUT CONTRAST MRA NECK WITHOUT AND WITH CONTRAST TECHNIQUE: Multiplanar, multiecho pulse sequences of the brain and surrounding structures were obtained without intravenous contrast. Angiographic images of the Circle of Willis were obtained using MRA technique without intravenous contrast. Angiographic images of the neck were obtained using MRA technique without and with intravenous contrast. Carotid stenosis measurements (when applicable) are obtained utilizing NASCET criteria, using the distal internal carotid diameter as the denominator. CONTRAST:  6.355mL GADAVIST GADOBUTROL 1 MMOL/ML IV SOLN COMPARISON:  Prior CT from 03/30/2018. FINDINGS: MRI HEAD FINDINGS Brain: Cerebral volume within normal limits for patient age. No significant cerebral white matter disease for age. Patchy multifocal areas of restricted  diffusion seen involving the left cerebellum, most pronounced inferiorly. Minimal patchy involvement of the central cerebellar vermis. Additional patchy small volume areas of restricted diffusion seen involving the paramedian right temporal occipital region. Mild diffusion abnormality also noted at the left midbrain/pons. No associated hemorrhage or mass effect. Otherwise, gray-white matter differentiation well maintained. No other areas of chronic cortical infarction. No foci of susceptibility artifact to suggest acute or chronic intracranial hemorrhage. No mass lesion, midline shift or mass effect. No hydrocephalus or extra-axial fluid collection. Pituitary gland suprasellar region normal. Midline structures intact. Vascular: Major intracranial vascular flow voids are maintained. Skull and upper cervical spine: Craniocervical junction within normal limits. Bone marrow signal intensity normal. No scalp soft tissue abnormality. Reversal of the normal upper cervical lordosis noted. Sinuses/Orbits: Globes and orbital soft tissues demonstrate no acute finding. Paranasal sinuses are clear. No mastoid effusion. Inner ear structures grossly normal. Other: None. MRA HEAD FINDINGS ANTERIOR CIRCULATION: Visualized distal cervical segments are both widely patent with antegrade flow. Petrous, cavernous, and supraclinoid segments patent without stenosis or other abnormality. A1 segments widely patent. Normal anterior communicating artery complex. Anterior cerebral arteries patent to their distal aspects without stenosis. No M1 stenosis or occlusion. Normal MCA bifurcations. Distal MCA branches well perfused and symmetric. POSTERIOR CIRCULATION: Dominant right vertebral artery widely patent to the vertebrobasilar junction. Right PICA patent and well perfused. Faint flow seen within the partially visualized distal left vertebral artery, with relatively absent flow within the left V3 segment. The left vertebral artery appears  occluded as it courses into the cranial vault. Attenuated irregular flow seen within the left V4 segment distally, and remains grossly patent to the vertebrobasilar junction. Left PICA grossly patent at its origin. Basilar patent to its distal aspect without stenosis or visible abnormality. Right superior cerebral artery widely patent. Left SCA faintly visible and appears grossly patent on time-of-flight sequence, not seen on MIP reconstructions. Both PCAs primarily supplied via the basilar. PCAs appear well perfused  to their distal aspects. No intracranial aneurysm. MRA NECK FINDINGS AORTIC ARCH: Visualized aortic arch normal in caliber with normal 3 vessel morphology. No hemodynamically significant stenosis seen about the origin of the great vessels. RIGHT CAROTID SYSTEM: Right CCA patent from its origin to the bifurcation without stenosis. Mild for age atheromatous irregularity about the right carotid bulb with no more than mild 20-25% stenosis by NASCET criteria. Right ICA patent distally without stenosis, evidence for dissection or occlusion. LEFT CAROTID SYSTEM: Left common carotid artery widely patent from its origin to the bifurcation. Mild for age atheromatous irregularity about the left carotid bulb with no more than mild 20-25% by NASCET criteria. Left ICA otherwise patent without stenosis, evidence for dissection, or occlusion. Minimal irregularity about the mid cervical left ICA noted. VERTEBRAL ARTERIES: Both vertebral arteries arise from the subclavian arteries. Right vertebral artery likely dominant. There is a short-segment moderate approximate 50% stenosis involving the pre foraminal right V1 segment (series 1058, image 10). Otherwise, the right vertebral artery is widely patent within the neck without stenosis, evidence for dissection, or occlusion. On the left, the left vertebral artery appears occluded at its origin (series 18, image 32). Distally, the left ICA is markedly attenuated and  diminutive, with near occlusion at its distal V2 segment. Some flow is seen within the left V3 segment, but is also markedly attenuated and irregular. Given the presence of acute posterior circulation infarcts, finding raises the possibility for an acute dissection. IMPRESSION: MRI HEAD IMPRESSION: 1. Patchy acute ischemic nonhemorrhagic infarcts involving the left cerebellum, cerebellar vermis, right temporoccipital region, and left midbrain/pons. 2. Otherwise normal brain MRI for age. MRA HEAD IMPRESSION: 1. Occlusion of the left vertebral artery with concern for acute dissection as below. Attenuated irregular flow within the left V4 segment distally may in part be retrograde in nature. Dominant right vertebral artery widely patent. 2. Poor visualization of the left SCA, which also may be partially occluded given the distribution of infarcts. 3. Widely patent anterior circulation. MRA NECK IMPRESSION: 1. 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 Given the presence of acute posterior circulation infarcts, these findings raise the possibility for an acute arterial dissection. Correlation with dedicated CTA could be performed for further evaluation as warranted. 2. Short-segment moderate approximate 50% stenosis involving the pre foraminal right V1 segment. Otherwise wide patency of the dominant right vertebral artery. 3. Mild for age atheromatous change about the carotid bifurcations without significant stenosis. Otherwise wide patency of both carotid artery systems in the neck. Critical Value/emergent results were called by telephone at the time of interpretation on 05/19/2020 at 1:34 a.m. to provider Dr. Marily Memos, who verbally acknowledged these results. Electronically Signed   By: Rise Mu M.D.   On: 05/19/2020 01:56   DG Chest Portable 1 View  Result Date: 05/18/2020 CLINICAL DATA:   Dizziness for several hours EXAM: PORTABLE CHEST 1 VIEW COMPARISON:  12/26/2019 CT FINDINGS: The heart size and mediastinal contours are within normal limits. Both lungs are clear. The visualized skeletal structures are unremarkable. IMPRESSION: No active disease. Electronically Signed   By: Alcide Clever M.D.   On: 05/18/2020 22:22   ECHOCARDIOGRAM COMPLETE  Result Date: 05/19/2020    ECHOCARDIOGRAM REPORT   Patient Name:   CHAYAH MCKEE Date of Exam: 05/19/2020 Medical Rec #:  [id number]      Height:       63.5 in Accession #:  [id number]     Weight:       136.6 lb Date of Birth:  [birth year]      BSA:          1.654 m Patient Age:    59 years       BP:           129/53 mmHg Patient Gender: F              HR:           63 bpm. Exam Location:  Inpatient Procedure: 2D Echo Indications:    stroke  History:        Patient has no prior history of Echocardiogram examinations.                 Risk Factors:Hypertension.  Sonographer:    Delcie Roch Referring Phys: [id number] VASUNDHRA RATHORE IMPRESSIONS  1. Left ventricular ejection fraction, by estimation, is 60 to 65%. The left ventricle has normal function. The left ventricle has no regional wall motion abnormalities. There is mild concentric left ventricular hypertrophy with more notable, discrete thickening of the basal-septal segment. Left ventricular diastolic parameters are consistent with Grade I diastolic dysfunction (impaired relaxation).  2. Right ventricular systolic function is normal. The right ventricular size is mildly enlarged. There is normal pulmonary artery systolic pressure. The estimated right ventricular systolic pressure is 26.6 mmHg.  3. The mitral valve is grossly normal. Trivial mitral valve regurgitation.  4. The aortic valve is tricuspid. Aortic valve regurgitation is not visualized. No aortic stenosis is present.  5. The inferior vena cava is normal in size with greater than 50% respiratory variability, suggesting right atrial pressure  of 3 mmHg. Comparison(s): No prior Echocardiogram. Conclusion(s)/Recommendation(s): No intracardiac source of embolism detected on this transthoracic study. A transesophageal echocardiogram is recommended to exclude cardiac source of embolism if clinically indicated. FINDINGS  Left Ventricle: Left ventricular ejection fraction, by estimation, is 60 to 65%. The left ventricle has normal function. The left ventricle has no regional wall motion abnormalities. The left ventricular internal cavity size was normal in size. There is  mild concentric left ventricular hypertrophy with more notable, discrete thickening of the basal-septal segment. Left ventricular diastolic parameters are consistent with Grade I diastolic dysfunction (impaired relaxation). Right Ventricle: The right ventricular size is mildly enlarged. No increase in right ventricular wall thickness. Right ventricular systolic function is normal. There is normal pulmonary artery systolic pressure. The tricuspid regurgitant velocity is 2.43  m/s, and with an assumed right atrial pressure of 3 mmHg, the estimated right ventricular systolic pressure is 26.6 mmHg. Left Atrium: Left atrial size was normal in size. Right Atrium: Right atrial size was normal in size. Pericardium: There is no evidence of pericardial effusion. Mitral Valve: The mitral valve is grossly normal. There is mild thickening of the mitral valve leaflet(s). There is mild calcification of the mitral valve leaflet(s). Trivial mitral valve regurgitation. Tricuspid Valve: The tricuspid valve is normal in structure. Tricuspid valve regurgitation is mild. Aortic Valve: The aortic valve is tricuspid. Aortic valve regurgitation is not visualized. No aortic stenosis is present. Pulmonic Valve: The pulmonic valve was not well visualized. Pulmonic valve regurgitation is trivial. Aorta: The aortic root and ascending aorta are structurally normal, with no evidence of dilitation. Venous: The inferior vena  cava is normal in size with greater than 50% respiratory variability, suggesting right atrial pressure of 3 mmHg. IAS/Shunts: No atrial level shunt detected by color flow Doppler.  LEFT VENTRICLE PLAX  2D LVIDd:         4.10 cm Diastology LVIDs:         2.30 cm LV e' medial:    6.74 cm/s LV PW:         0.80 cm LV E/e' medial:  13.5 LV IVS:        0.80 cm LV e' lateral:   9.46 cm/s                        LV E/e' lateral: 9.6  RIGHT VENTRICLE             IVC RV S prime:     20.70 cm/s  IVC diam: 1.40 cm TAPSE (M-mode): 2.5 cm LEFT ATRIUM             Index       RIGHT ATRIUM          Index LA diam:        3.80 cm 2.30 cm/m  RA Area:     9.67 cm LA Vol (A2C):   29.9 ml 18.08 ml/m RA Volume:   18.90 ml 11.43 ml/m LA Vol (A4C):   27.8 ml 16.81 ml/m LA Biplane Vol: 28.8 ml 17.41 ml/m  AORTIC VALVE LVOT Vmax:   80.80 cm/s LVOT Vmean:  55.900 cm/s LVOT VTI:    0.183 m  AORTA Ao Asc diam: 2.90 cm MITRAL VALVE               TRICUSPID VALVE MV Area (PHT): 3.85 cm    TR Peak grad:   23.6 mmHg MV Decel Time: 197 msec    TR Vmax:        243.00 cm/s MV E velocity: 90.80 cm/s MV A velocity: 98.50 cm/s  SHUNTS MV E/A ratio:  0.92        Systemic VTI: 0.18 m Laurance Flatten MD Electronically signed by Laurance Flatten MD Signature Date/Time: 05/19/2020/3:50:16 PM    Final     Labs:  CBC: Recent Labs    12/06/19 1417 05/18/20 2119 05/19/20 0349 05/20/20 0330  WBC 4.8 11.6* 10.8* 6.7  HGB 12.6 12.7 12.4 12.7  HCT 38.1 38.6 36.4 37.5  PLT 201.0 206 215 196    COAGS: Recent Labs    05/21/20 0752  INR 1.1    BMP: Recent Labs    12/06/19 1417 05/18/20 2119 05/20/20 0330  NA 141 137 139  K 3.8 2.7* 4.4  CL 103 101 105  CO2 32 25 27  GLUCOSE 90 143* 87  BUN 13 12 13   CALCIUM 9.7 9.2 9.3  CREATININE 0.78 0.65 0.79  GFRNONAA  --  >60 >60    LIVER FUNCTION TESTS: Recent Labs    12/06/19 1417  BILITOT 0.3  AST 25  ALT 25  ALKPHOS 71  PROT 7.2  ALBUMIN 4.4     Assessment and  Plan:  Acute CVA (left cerebellum, cerebellar vermis, right temporoccipital and left midbrain/pons infarcts) in setting of left VA occlusion/possible dissection. Plan for image-guided diagnostic cerebral arteriogram in IR with Dr. Corliss Skains tentatively for today pending IR scheduling. Patient is NPO. Afebrile and WBCs WNL. Ok to proceed with Plavix/Aspirin use per Dr. Corliss Skains. INR 1.1 today.  Risks and benefits of diagnostic cerebral angiogram were discussed with the patient including, but not limited to bleeding, infection, vascular injury, stroke, or contrast induced renal failure. This interventional procedure involves the use of X-rays and because of the nature of the planned  procedure, it is possible that we will have prolonged use of X-ray fluoroscopy. Potential radiation risks to you include (but are not limited to) the following: - A slightly elevated risk for cancer  several years later in life. This risk is typically less than 0.5% percent. This risk is low in comparison to the normal incidence of human cancer, which is 33% for women and 50% for men according to the [employer]. - Radiation induced injury can include skin redness, resembling a rash, tissue breakdown / ulcers and hair loss (which can be temporary or permanent).  The likelihood of either of these occurring depends on the difficulty of the procedure and whether you are sensitive to radiation due to previous procedures, disease, or genetic conditions.  IF your procedure requires a prolonged use of radiation, you will be notified and given written instructions for further action.  It is your responsibility to monitor the irradiated area for the 2 weeks following the procedure and to notify your physician if you are concerned that you have suffered a radiation induced injury.   All of the patient's questions were answered, patient is agreeable to proceed. Consent signed and in IR control room.   Thank you for  this interesting consult.  I greatly enjoyed meeting Valta Dillon and look forward to participating in their care.  A copy of this report was sent to the requesting provider on this date.  Electronically Signed: Elwin Mocha, PA-C 05/21/2020, 9:06 AM   I spent a total of 40 Minutes in face to face in clinical consultation, greater than 50% of which was counseling/coordinating care for CVA/diagnostic cerebral arteriogram.

## 2020-05-21 NOTE — Sedation Documentation (Signed)
TR band applied 12cc of air @ 1430.

## 2020-05-21 NOTE — Progress Notes (Signed)
STROKE TEAM PROGRESS NOTE   INTERVAL HISTORY Per HPI Patient reports having intermittent vertigo and mild headaches for the past 6 months.  Most recently she has experienced headaches with associated diplopia.  On 3/6, she woke up with a headache, felt dizzy and vomited. She felt unsteady while walking back to her bedroom and had a fall hitting the back of her head on the trash can that she was carrying.  Her daughter found her on the floor and was conscious. She was too weak to get up from the floor and family called EMS.  MRI brain showed moderate sized left cerebellum as well as acute ischemic strokes in the cerebellar vermis, right temporoccipital/PCA territory and left midbrain/pons.  MRA head and neck showed occlusion of the left vertebral artery and attenuated distal L V4 flow which may be retrograde flow.  She was started on aspirin and plavix and was admitted to the ICU for close neuro monitoring.        Neuro exam reveals nystagmus with gaze to the right,  left lower extremity mild ataxia with 4+/5 strength.      We will plan for cerebral angiogram tomorrow to evaluate left vertebral artery occlusion. NPO after midnight.  Continue Aspirin 81mg  and plavix 75mg .     Vitals:   05/21/20 0400 05/21/20 0409 05/21/20 0500 05/21/20 0600  BP: 134/72  127/67 (!) 112/59  Pulse: 68  69 73  Resp: 18  18 19   Temp:  98.1 F (36.7 C)    TempSrc:  Oral    SpO2: 99%  97% 96%  Weight:       CBC:  Recent Labs  Lab 05/18/20 2119 05/19/20 0349 05/20/20 0330  WBC 11.6* 10.8* 6.7  NEUTROABS 9.6*  --   --   HGB 12.7 12.4 12.7  HCT 38.6 36.4 37.5  MCV 90.2 88.1 89.9  PLT 206 215 196   Basic Metabolic Panel:  Recent Labs  Lab 05/18/20 2119 05/20/20 0330  NA 137 139  K 2.7* 4.4  CL 101 105  CO2 25 27  GLUCOSE 143* 87  BUN 12 13  CREATININE 0.65 0.79  CALCIUM 9.2 9.3  MG 1.8  --    Lipid Panel:  Recent Labs  Lab 05/19/20 0349  CHOL 237*  TRIG 53  HDL 66  CHOLHDL 3.6  VLDL 11   LDLCALC 160*   HgbA1c:  Recent Labs  Lab 05/19/20 0349  HGBA1C 5.5   Urine Drug Screen:  Recent Labs  Lab 05/19/20 0340  LABOPIA NONE DETECTED  COCAINSCRNUR NONE DETECTED  LABBENZ NONE DETECTED  AMPHETMU NONE DETECTED  THCU NONE DETECTED  LABBARB NONE DETECTED    Alcohol Level  Recent Labs  Lab 05/18/20 2229  ETH <10    IMAGING past 24 hours ECHOCARDIOGRAM COMPLETE  Result Date: 05/19/2020 IMPRESSIONS  1. Left ventricular ejection fraction, by estimation, is 60 to 65%. The left ventricle has normal function. The left ventricle has no regional wall motion abnormalities. There is mild concentric left ventricular hypertrophy with more notable, discrete thickening of the basal-septal segment. Left ventricular diastolic parameters are consistent with Grade I diastolic dysfunction (impaired relaxation).  2. Right ventricular systolic function is normal. The right ventricular size is mildly enlarged. There is normal pulmonary artery systolic pressure. The estimated right ventricular systolic pressure is 26.6 mmHg.  3. The mitral valve is grossly normal. Trivial mitral valve regurgitation.  4. The aortic valve is tricuspid. Aortic valve regurgitation is not visualized. No aortic stenosis  is present.  5. The inferior vena cava is normal in size with greater than 50% respiratory variability, suggesting right atrial pressure of 3 mmHg.   05/18/2020 MRA HEAD IMPRESSION:  1. Occlusion of the left vertebral artery with concern for acute dissection as below. Attenuated irregular flow within the left V4 segment distally may in part be retrograde in nature. Dominant right vertebral artery widely patent. 2. Poor visualization of the left SCA, which also may be partially occluded given the distribution of infarcts. 3. Widely patent anterior circulation.  05/18/2020 MRI Head Patchy acute ischemic nonhemorrhagic infarcts involving the left cerebellum, cerebellar vermis, right temporoccipital  region, and left midbrain/pons.  MRA Neck 1. 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 Given the presence of acute posterior circulation infarcts, these findings raise the possibility for an acute arterial dissection. Correlation with dedicated CTA could be performed for further evaluation as warranted. 2. Short-segment moderate approximate 50% stenosis involving the preforaminal right V1 segment. Otherwise wide patency of the dominant right vertebral artery. 3. Mild for age atheromatous change about the carotid bifurcations without significant stenosis. Otherwise wide patency of both carotid artery systems in the neck.  PHYSICAL EXAM Constitutional: Appears well-developed and well-nourished.  Psych: Affect appropriate to situation Eyes: No scleral injection HENT: No OP obstruction. Head: Normocephalic.  Cardiovascular: Normal rate and regular rhythm.  Respiratory: Effort normal, symmetric excursions bilaterally, no audible wheezing. GI: Soft.  No distension. There is no tenderness.  Skin: WDI  Neuro: Mental Status: Patient awake and alert, oriented to person, place, month, year, and situation. Patient is able to give a clear and coherent history. Speech fluent, intact comprehension and repetition. No dysarthria Visual Fields are full. Pupils are equal, round, and reactive to light. EOM: nystagmus with gaze to the right, no ptosis or diplopia.  Facial sensation is symmetric to temperature Facial movement is symmetric.  Hearing is intact to voice. Uvula midline and palate elevates symmetrically. Shoulder shrug is symmetric. Tongue is midline without atrophy or fasciculations.  Tone is normal. Bulk is normal. 4+/5 left lower extremity. Sensation is symmetric to light touch and temperature in the arms and legs. Deep Tendon Reflexes: 2+ and symmetric in the biceps and  patellae. Toes are downgoing bilaterally. FNF intact and HKS are mild ataxia left leg. Gait -mildly ataxic  Exam remains unchanged from yesterday  ASSESSMENT/PLAN [name] is a 65 y.o. female with history of hypertension, HLD, mitral valve prolapse who presented to the ED after experiencing nausea, vomiting, vertigo and ataxia. She was found on the floor (3/6) too weak to get up.  Her daughter called EMS.  LKN on 3/5 evening.  Brain MRI showed patchy acute ischemic nonhemorrhagic infarcts involving the left cerebellum, cerebellar vermis, right temporal occipital region, and left midbrain/pons. MRA head and neck showed findings concerning for acute vertebral artery dissection.    Stroke:  left cerebellum, cerebellar vermis, right temporoccipital and left midbrain/pons infarcts likely due to occlusion of the left vertebral artery.  MRI head: Patchy acute ischemic nonhemorrhagic infarcts involving the left cerebellum, cerebellar vermis, right temporoccipital region, and left midbrain/pons.  MRA head: Partial occlusion of the left vertebral artery at its origin, with severely attenuated and irregular flow distally within the left V2 and V3 segments. Poor visualization of the left SCA, which also may be partially occluded given the distribution of infarcts.  MRA Neck: Short-segment moderate approximate 50% stenosis  involving the preforaminal right V1 segment.   2D Echo EF 60-65%, Grade I diastolic dysfunction  LDL 160  HgbA1c 5.5  VTE prophylaxis - SCDs  Diet: heart healthy  No antithrombotic prior to admission, now on aspirin 81 mg daily and clopidogrel 75 mg daily.  Continue DAPT  Therapy recommendations:  PT: CIR, OT CIR  Disposition:  pending  Left Vertebral artery occlusion  Concern for possible dissection  Neurovascular consulted: plan for cerebral angiogram tomorrow  SBP goal normotensive.  Avoid hypotension.  Aspirin 81mg , Plavix 75mg   Hypertension  Home  meds:  Hydrochlorothiazide 25mg  daily  Stable . Long-term BP goal normotensive  Hyperlipidemia  Home meds:  none  LDL 160, goal < 70  Increase atorvastating to 80mg  daily  Continue statin at discharge   Other Stroke Risk Factors  Advanced Age >/= 63   Coronary artery disease: Murmur, Mitral valve prolapse  Other Active Problems  History of alpha 1 antitrypsin deficiency, phenotype SS, Normal PFTs  anxiety  Hospital day # 2 Plan discussed with Dr. on the unit. Images and case discussed with Dr. who will be doing an angiogram today.   Procedures plan for today: Cerebral angiogram  , MD Stroke Neurology Pager: [phone]   CRITICAL CARE ATTESTATION Performed by: Craige Cotta, MD Total critical care time: 30 minutes Critical care time was exclusive of separately billable procedures and treating other patients and/or supervising APPs/Residents/Students Critical care was necessary to treat or prevent imminent or life-threatening deterioration due to stroke This patient is critically ill and at significant risk for neurological worsening and/or death and care requires constant monitoring. Critical care was time spent personally by me on the following activities: development of treatment plan with patient and/or surrogate as well as nursing, discussions with consultants, evaluation of patient's response to treatment, examination of patient, obtaining history from patient or surrogate, ordering and performing treatments and interventions, ordering and review of laboratory studies, ordering and review of radiographic studies, pulse oximetry, re-evaluation of patient's condition, participation in multidisciplinary rounds and medical decision making of high complexity in the care of this patient.   To contact Stroke Continuity provider, please refer to Corliss Skains. After hours, contact General Neurology

## 2020-05-22 DIAGNOSIS — I63112 Cerebral infarction due to embolism of left vertebral artery: Secondary | ICD-10-CM | POA: Diagnosis not present

## 2020-05-22 DIAGNOSIS — I10 Essential (primary) hypertension: Secondary | ICD-10-CM | POA: Diagnosis not present

## 2020-05-22 MED ORDER — HYDROCHLOROTHIAZIDE 25 MG PO TABS
25.0000 mg | ORAL_TABLET | Freq: Every day | ORAL | Status: DC
  Administered 2020-05-22 – 2020-05-23 (×2): 25 mg via ORAL
  Filled 2020-05-22 (×2): qty 1

## 2020-05-22 MED ORDER — SODIUM CHLORIDE 0.9 % IV SOLN: INTRAVENOUS | Status: DC | PRN

## 2020-05-22 NOTE — PMR Pre-admission (Addendum)
PMR Admission Coordinator Pre-Admission Assessment  Patient: Jeanette Barnes is an [age] y.o., female MRN: [id number] DOB: 08/17/1955 Height:   Weight: 62.1 kg              Insurance Information HMO:     PPO:      PCP:      IPA:      80/20:      OTHER:  PRIMARY: [employer]      Policy#: [id number]      Subscriber: pt CM Name: [name]      Phone#: [phone] ext [phone]     Fax#: [phone] Pre-Cert#: [id number] approved for 7 days     Employer:  Benefits:  Phone #: [phone]     Name: 3/10 Eff. Date: 03/15/2020     Deduct: none      Out of Pocket Max: $3900      Life Max: none  CIR: $295 co pay per day days 1 until 6      SNF: 100% Outpatient: $20 per visit     Co-Pay: visits per medical neccesity Home Health: 100%      Co-Pay: visits per medical neccesity DME: 80%     Co-Pay: 20% Providers: in network  SECONDARY: none  Financial Counselor:       Phone#:   The Data processing manager"Data Collection Information Summary" for patients in Inpatient Rehabilitation Facilities with attached "Privacy Act Statement-Health Care Records" was provided and verbally reviewed with: Patient  Emergency Contact Information Contact Information     Name Relation Home Work Mobile   Barnes,Jeanette Daughter [phone]        Current Medical History  Patient Admitting Diagnosis: CVA  History of Present Illness:   65 y.o. right-handed female with history of hypertension, hyperlipidemia, mitral valve prolapse.  Presented 05/18/2020 with lethargy, dizziness and unstable gait.  Patient got up to go to the bathroom had a vomiting episode fell to her left hit her neck on the trash bin.  Denied loss of consciousness.  MRI/MRA showed patchy acute ischemic nonhemorrhagic infarct involving the left cerebellum, cerebellar vermis, right temporal occipital region and left midbrain pons.  MRA of the neck occlusion of the left vertebral artery with concern for acute dissection.  MRA of the head partial occlusion of the left vertebral artery  its origin. Started on ASA and Plavix and admitted to ICU for close monitoring.  Diagnostic cerebral angiogram revealed occluded right vertebral artery, likely chronic. 30 % stenosis of the origin of the right vertebral artery. Neurology recommended dual antiplatelet and high dose statin for 3 months followed by ASA only and statin. Needs follow up interventional radiology in 6 months with a CTA of head and neck for follow up on the stenotic right vertebral artery origin. 30 day outpatient cardiac monitoring to evaluate for underlying atrial fibrillation.  Home meds for hypertension with HCTZ. Increase atorvastatin to 80 mg.      Complete NIHSS TOTAL: 0 Glasgow Coma Scale Score: (!) 20  Past Medical History  Past Medical History:  Diagnosis Date   AAT (alpha-1-antitrypsin) deficiency (HCC)    Anxiety    Heart murmur    mitral valve prolapse   Hypercholesteremia    Hypertension    Pulmonary nodule    Vitamin D deficiency     Family History  family history includes Emphysema in her father; Hypertension in her brother; Kidney disease in her brother.  Prior Rehab/Hospitalizations:  Has the patient had prior rehab or hospitalizations prior to admission? Yes  Has  the patient had major surgery during 100 days prior to admission? Yes  Current Medications   Current Facility-Administered Medications:    0.9 %  sodium chloride infusion, , Intravenous, PRN, [name], MD   acetaminophen (TYLENOL) tablet 650 mg, 650 mg, Oral, Q4H PRN, 650 mg at 05/23/20 1038 **OR** [DISCONTINUED] acetaminophen (TYLENOL) 160 MG/5ML solution 650 mg, 650 mg, Per Tube, Q4H PRN **OR** [DISCONTINUED] acetaminophen (TYLENOL) suppository 650 mg, 650 mg, Rectal, Q4H PRN, [name], MD   aspirin EC tablet 81 mg, 81 mg, Oral, Daily, [name], MD, 81 mg at 05/23/20 1039   atorvastatin (LIPITOR) tablet 80 mg, 80 mg, Oral, Daily, [name], MD, 80 mg at 05/23/20 1039   Chlorhexidine Gluconate Cloth  2 % PADS 6 each, 6 each, Topical, Daily, [name], [name], MD, 6 each at 05/21/20 2220   cholecalciferol (VITAMIN D3) tablet 400 Units, 400 Units, Oral, Daily, [name], MD, 400 Units at 05/23/20 1039   clopidogrel (PLAVIX) tablet 75 mg, 75 mg, Oral, Daily, [name], MD, 75 mg at 05/23/20 1039   docusate sodium (COLACE) capsule 100 mg, 100 mg, Oral, BID PRN, [name], [name], MD   hydrochlorothiazide (HYDRODIURIL) tablet 25 mg, 25 mg, Oral, Daily, [name], [name], MD, 25 mg at 05/23/20 1039   MEDLINE mouth rinse, 15 mL, Mouth Rinse, BID, [name], [name], MD, 15 mL at 05/23/20 1040   ondansetron (ZOFRAN) injection 4 mg, 4 mg, Intravenous, Q6H PRN, [name], MD   pantoprazole (PROTONIX) EC tablet 40 mg, 40 mg, Oral, Daily, [name], [name], MD, 40 mg at 05/23/20 1039   polyethylene glycol (MIRALAX / GLYCOLAX) packet 17 g, 17 g, Oral, Daily PRN, [name], [name], MD   vitamin B-12 (CYANOCOBALAMIN) tablet 1,000 mcg, 1,000 mcg, Oral, Daily, [name], MD, 1,000 mcg at 05/23/20 1040  Patients Current Diet:  Diet Order             Diet regular Room service appropriate? Yes; Fluid consistency: Thin  Diet effective now                   Precautions / Restrictions Precautions Precautions: Fall Precaution Comments: Pt with dizziness when moving head to left and when looking down. 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  Independent  Indoor Mobility: Did the patient need assistance with walking from room to room (with  or without device)? Independent  Stairs: Did the patient need assistance with internal or external stairs (with or without device)? Independent  Functional Cognition: Did the patient need help planning regular tasks such as shopping or remembering to take medications? Independent  Home Assistive Devices / Equipment Home Equipment: None  Prior Device Use: Indicate devices/aids used by the patient prior to current illness, exacerbation or injury? None of the above  Current Functional Level Cognition  Arousal/Alertness: Awake/alert Overall Cognitive Status: Within Functional Limits for tasks assessed Orientation Level: Oriented X4 Safety/Judgement: Decreased awareness of safety,Decreased awareness of deficits General Comments: Pt(compared to notes from yesterday) appears to answer questions more quickly and more sure of answeres. Attention: Sustained  Sustained Attention: Impaired Sustained Attention Impairment: Verbal basic Memory: Appears intact Awareness: Appears intact    Extremity Assessment (includes Sensation/Coordination)  Upper Extremity Assessment: LUE deficits/detail LUE Deficits / Details: FNF continues to be slightly uncoordinated and quick RAM slightly impaired on the Left. LUE Sensation: WNL LUE Coordination: decreased fine motor  Lower Extremity Assessment: Defer to PT evaluation LLE Deficits / Details: mild decreased coordination with heel to shin LLE Sensation: WNL LLE Coordination: decreased gross motor    ADLs  Overall ADL's : Needs assistance/impaired Eating/Feeding: Set up,Sitting Eating/Feeding Details (indicate cue type and reason): recliner, with increased time Grooming: Wash/dry hands,Oral care,Minimal assistance,Standing Grooming Details (indicate cue type and reason): Pt stood at sink to groom. pt required assist with balance wtih one LOB to the left with min assist to recover. Pt reports feeling dizzy when she looks down. Upper Body Bathing: Minimal  assistance,Sitting Upper Body Bathing Details (indicate cue type and reason): recliner, with increased time Lower Body Bathing: Maximal assistance Lower Body Bathing Details (indicate cue type and reason): Mod A +2 sit<>stand Upper Body Dressing : Minimal assistance,Sitting Upper Body Dressing Details (indicate cue type and reason): pt donned gown at EOB with min assist to tie gown. Lower Body Dressing: Minimal assistance,Sit to/from stand,Cueing for compensatory techniques Lower Body Dressing Details (indicate cue type and reason): Pt required min assist to come to standing and required min assist to maintain balance in standing while managng clothing. Toilet Transfer: Moderate assistance,Ambulation Statistician Details (indicate cue type and reason): Pt walked to bathroom with mod assist (min assist execept one LOB requiring mod assist to recover). Toileting- Clothing Manipulation and Hygiene: Min guard,Sitting/lateral lean Toileting - Clothing Manipulation Details (indicate cue type and reason): +1 assist to stand to clean self.  Otherwise cleaned self in sitting with min guard. Functional mobility during ADLs: Minimal assistance General ADL Comments: Pt doing better wtih adls this am but continues to require min assist due to decreased balance, L side incoordination and mild impulsivity.    Mobility  Overal bed mobility: Needs Assistance Bed Mobility: Supine to Sit,Sit to Supine Rolling: Supervision Sidelying to sit: Supervision Supine to sit: Supervision Sit to supine: Min assist General bed mobility comments: No assist to sit EOB    Transfers  Overall transfer level: Needs assistance Equipment used: 1 person hand held assist,Rolling walker (2 wheeled) Transfers: Sit to/from [employer] Sit to Stand: Min assist Stand pivot transfers: Min assist General transfer comment: Pt needs steadying assist as she gets dizzy especially with head turns.  Pt needs RW for  stability at present time.    Ambulation / Gait / Stairs / Wheelchair Mobility  Ambulation/Gait Ambulation/Gait assistance: +2 safety/equipment,Min assist,Mod assist Gait Distance (Feet): 200 Feet Assistive device: Rolling walker (2 wheeled),None Gait Pattern/deviations: Step-through pattern,Decreased stride length,Staggering right,Staggering left General Gait Details: Pt was able to walk on unit with min assist and RW and chair follow for safety.  discussed compensation by decr head turns as this incr vertigo as pt with suspected vestibular hypofunction.  Pt needed mod assist without device as pt does get unsteady without UE support.  HR from 102-120 bpm with other VSS. Pt experienced imbalance when turning head in both directions. Also noted that if pt looked around she was off balance with RW in use.  Trial of walk without RW and pt mod assist with challenges to balance. Gait velocity interpretation: <1.31 ft/sec, indicative of household ambulator    Posture / Balance Dynamic Sitting Balance Sitting  balance - Comments: No lean today Balance Overall balance assessment: Needs assistance Sitting-balance support: Feet supported Sitting balance-Leahy Scale: Good Sitting balance - Comments: No lean today Standing balance support: Bilateral upper extremity supported Standing balance-Leahy Scale: Poor Standing balance comment: Pt reliant on outside support to remain in standing due to dizziness when looking down or when turning head to the Left and right.  Pt takes small short steps when she becomes dizzy making pt a fall risk.    Special needs/care consideration    Previous Home Environment  Living Arrangements: Alone  Lives With: Alone Available Help at Discharge: Available PRN/intermittently Type of Home: Other(Comment) (townhouse) Home Layout: One level Home Access: Stairs to enter Entrance Stairs-Rails: None Entrance Stairs-Number of Steps: 2 Bathroom Shower/Tub: Economist: Standard Bathroom Accessibility: Yes How Accessible: Accessible via walker Home Care Services: No  Discharge Living Setting Plans for Discharge Living Setting: Patient's home,Other (Comment) (townhouse) Type of Home at Discharge: Other (Comment) (townhouse) Discharge Home Layout: One level Discharge Home Access: Stairs to enter Entrance Stairs-Rails: None Entrance Stairs-Number of Steps: 2 Discharge Bathroom Shower/Tub: Tub/shower unit Discharge Bathroom Toilet: Standard Discharge Bathroom Accessibility: Yes How Accessible: Accessible via walker Does the patient have any problems obtaining your medications?: No  Social/Family/Support Systems Contact Information: Daughter, Jeanette Anticipated Caregiver: daughter, intermittent Anticipated Caregiver's Contact Information: see above Caregiver Availability: Intermittent Discharge Plan Discussed with Primary Caregiver: Yes Is Caregiver In Agreement with Plan?: Yes Does Caregiver/Family have Issues with Lodging/Transportation while Pt is in Rehab?: No  Goals Patient/Family Goal for Rehab: Mod I with PT and OT Expected length of stay: ELOS 8-12 days Pt/Family Agrees to Admission and willing to participate: Yes Program Orientation Provided & Reviewed with Pt/Caregiver Including Roles  & Responsibilities: Yes  Decrease burden of Care through IP rehab admission: n/a  Possible need for SNF placement upon discharge:not anticipated  Patient Condition: This patient's medical and functional status has changed since the consult dated: 05/19/2020 in which the Rehabilitation Physician determined and documented that the patient's condition is appropriate for intensive rehabilitative care in an inpatient rehabilitation facility. See "History of Present Illness" (above) for medical update. Functional changes are: min assist overall. Patient's medical and functional status update has been discussed with the Rehabilitation  physician and patient remains appropriate for inpatient rehabilitation. Will admit to inpatient rehab today.  Preadmission Screen Completed By:  Clois Dupes, RN, 05/23/2020 11:51 AM ______________________________________________________________________   Discussed status with Dr. Allena Katz on 05/23/2020 at  1153 and received approval for admission today.  Admission Coordinator:  Clois Dupes, time 2707 Date 05/23/2020

## 2020-05-22 NOTE — Progress Notes (Addendum)
STROKE TEAM PROGRESS NOTE   INTERVAL HISTORY Per HPI Patient reports having intermittent vertigo and mild headaches for the past 6 months.  Most recently she has experienced headaches with associated diplopia.  On 3/6, she woke up with a headache, felt dizzy and vomited. She felt unsteady while walking back to her bedroom and had a fall hitting the back of her head on the trash can that she was carrying.  Her daughter found her on the floor and was conscious. She was too weak to get up from the floor and family called EMS.  MRI brain showed moderate sized left cerebellum as well as acute ischemic strokes in the cerebellar vermis, right temporoccipital/PCA territory and left midbrain/pons.  MRA head and neck showed occlusion of the left vertebral artery and attenuated distal L V4 flow which may be retrograde flow.  She was started on aspirin and plavix and was admitted to the ICU for close neuro monitoring.        Status post angiogram overnight See findings below No complaints overnight   Vitals:   05/22/20 0400 05/22/20 0500 05/22/20 0600 05/22/20 0738  BP: (!) 129/42 (!) 142/61 (!) 150/71   Pulse: 86 67 69   Resp: 18 20 17    Temp:    97.6 F (36.4 C)  TempSrc:    Oral  SpO2: 98% 95% 98%   Weight:  61.9 kg     CBC:  Recent Labs  Lab 05/18/20 2119 05/19/20 0349 05/20/20 0330  WBC 11.6* 10.8* 6.7  NEUTROABS 9.6*  --   --   HGB 12.7 12.4 12.7  HCT 38.6 36.4 37.5  MCV 90.2 88.1 89.9  PLT 206 215 196   Basic Metabolic Panel:  Recent Labs  Lab 05/18/20 2119 05/20/20 0330  NA 137 139  K 2.7* 4.4  CL 101 105  CO2 25 27  GLUCOSE 143* 87  BUN 12 13  CREATININE 0.65 0.79  CALCIUM 9.2 9.3  MG 1.8  --    Lipid Panel:  Recent Labs  Lab 05/19/20 0349  CHOL 237*  TRIG 53  HDL 66  CHOLHDL 3.6  VLDL 11  LDLCALC 07/19/20*   HgbA1c:  Recent Labs  Lab 05/19/20 0349  HGBA1C 5.5   Urine Drug Screen:  Recent Labs  Lab 05/19/20 0340  LABOPIA NONE DETECTED  COCAINSCRNUR NONE  DETECTED  LABBENZ NONE DETECTED  AMPHETMU NONE DETECTED  THCU NONE DETECTED  LABBARB NONE DETECTED    Alcohol Level  Recent Labs  Lab 05/18/20 2229  ETH <10    IMAGING past 24 hours ECHOCARDIOGRAM COMPLETE  Result Date: 05/19/2020 IMPRESSIONS  1. Left ventricular ejection fraction, by estimation, is 60 to 65%. The left ventricle has normal function. The left ventricle has no regional wall motion abnormalities. There is mild concentric left ventricular hypertrophy with more notable, discrete thickening of the basal-septal segment. Left ventricular diastolic parameters are consistent with Grade I diastolic dysfunction (impaired relaxation).  2. Right ventricular systolic function is normal. The right ventricular size is mildly enlarged. There is normal pulmonary artery systolic pressure. The estimated right ventricular systolic pressure is 26.6 mmHg.  3. The mitral valve is grossly normal. Trivial mitral valve regurgitation.  4. The aortic valve is tricuspid. Aortic valve regurgitation is not visualized. No aortic stenosis is present.  5. The inferior vena cava is normal in size with greater than 50% respiratory variability, suggesting right atrial pressure of 3 mmHg.   05/18/2020 MRA HEAD IMPRESSION:  1. Occlusion of  the left vertebral artery with concern for acute dissection as below. Attenuated irregular flow within the left V4 segment distally may in part be retrograde in nature. Dominant right vertebral artery widely patent. 2. Poor visualization of the left SCA, which also may be partially occluded given the distribution of infarcts. 3. Widely patent anterior circulation.  05/18/2020 MRI Head Patchy acute ischemic nonhemorrhagic infarcts involving the left cerebellum, cerebellar vermis, right temporoccipital region, and left midbrain/pons.  MRA Neck 1. Partial occlusion of the left vertebral artery at its origin, with severely attenuated and irregular flow distally within the  left V2 and V3 segments. Given the presence of acute posterior circulation infarcts, these findings raise the possibility for an acute arterial dissection. Correlation with dedicated CTA could be performed for further evaluation as warranted. 2. Short-segment moderate approximate 50% stenosis involving the preforaminal right V1 segment. Otherwise wide patency of the dominant right vertebral artery. 3. Mild for age atheromatous change about the carotid bifurcations without significant stenosis. Otherwise wide patency of both carotid artery systems in the neck.  PHYSICAL EXAM Constitutional: Appears well-developed and well-nourished.  Psych: Affect appropriate to situation Eyes: No scleral injection HENT: No OP obstruction. Head: Normocephalic.  Cardiovascular: Normal rate and regular rhythm.  Respiratory: Effort normal, symmetric excursions bilaterally, no audible wheezing. GI: Soft.  No distension. There is no tenderness.  Skin: WDI-rt radial site WDI in bandage  Neuro: Mental Status: Patient awake and alert, oriented to person, place, month, year, and situation. Patient is able to give a clear and coherent history. Speech fluent, intact comprehension and repetition. No dysarthria Visual Fields are full. Pupils are equal, round, and reactive to light. EOM: nystagmus with gaze to the right, no ptosis or diplopia.  Facial sensation is symmetric to temperature Facial movement is symmetric.  Hearing is intact to voice. Uvula midline and palate elevates symmetrically. Shoulder shrug is symmetric. Tongue is midline without atrophy or fasciculations.  Tone is normal. Bulk is normal. 4+/5 left lower extremity. Sensation is symmetric to light touch and temperature in the arms and legs. Deep Tendon Reflexes: 2+ and symmetric in the biceps and patellae. Toes are downgoing bilaterally. FNF intact and HKS are mild ataxia left leg. Gait -mildly ataxic  Exam remains unchanged or mildly  improved from yesterday.  ASSESSMENT/PLAN [name] is a 65 y.o. female with history of hypertension, HLD, mitral valve prolapse who presented to the ED after experiencing nausea, vomiting, vertigo and ataxia. She was found on the floor (3/6) too weak to get up.  Her daughter called EMS.  LKN on 3/5 evening.  Brain MRI showed patchy acute ischemic nonhemorrhagic infarcts involving the left cerebellum, cerebellar vermis, right temporal occipital region, and left midbrain/pons. MRA head and neck showed findings concerning for acute vertebral artery dissection.    Diagnostic cerebral angiogram reveals occluded right vertebral artery-likely chronic.  30% stenosis of the origin of the right vertebral artery.  Work-up completed from a neurological standpoint-see detailed plans below. Needs dual antiplatelets and high-dose statin for 3 months followed by aspirin only and statin. Needs follow-up with interventional radiology in 6 months with a CTA of head and neck for follow-up on the stenotic right vertebral artery origin. 30-day outpatient cardiac monitoring to evaluate for underlying atrial fibrillation  Stroke:  left cerebellum, cerebellar vermis, right temporoccipital and left midbrain/pons infarcts likely due to occlusion of the left vertebral artery.  MRI head: Patchy acute ischemic nonhemorrhagic infarcts involving the left cerebellum, cerebellar vermis, right temporoccipital region, and left  midbrain/pons.  MRA head: Partial occlusion of the left vertebral artery at its origin, with severely attenuated and irregular flow distally within the left V2 and V3 segments. Poor visualization of the left SCA, which also may be partially occluded given the distribution of infarcts.  MRA Neck: Short-segment moderate approximate 50% stenosis involving the preforaminal right V1 segment.   2D Echo EF 60-65%, Grade I diastolic dysfunction, LA normal size.  LDL 160  HgbA1c 5.5  VTE prophylaxis -  SCDs  Diet: heart healthy  No antithrombotic prior to admission, now on aspirin 81 mg daily and clopidogrel 75 mg daily.  Continue DAPT for a total of 3 months followed by aspirin only.  30-day cardiac monitoring to look for any evidence of atrial fibrillation-although likely atheroembolic but a cardioembolic source should be evaluated as an outpatient  Therapy recommendations:  PT: CIR, OT CIR  Disposition:  pending as per primary team.  Concern for dissection due to left Vertebral artery occlusion-likely chronic based on diagnostic cerebral angiogram.  Likely chronic left vertebral artery occlusion due to the occluded left vertebral artery at the origin without distal reconstitution in the feeling of the left vertebrobasilar junction retrogradely from the right vertebral artery.  30% stenosis of the origin of the right vertebral artery.  Aspirin 81mg , Plavix 75mg  for 3 months followed by aspirin only.  Follow-up with interventional radiology-Dr. in 6 months with a CTA head and neck for follow-up on the right vertebral artery stenosis.  Hypertension  Home meds:  Hydrochlorothiazide 25mg  daily  Stable . Long-term BP goal normotensive  Hyperlipidemia  Home meds:  none  LDL 160, goal < 70  Increase atorvastatin to 80mg  daily-continue atorvastatin 80 at discharge  Other Stroke Risk Factors  Advanced Age >/= 72   Coronary artery disease: Murmur, Mitral valve prolapse  Other Active Problems  History of alpha 1 antitrypsin deficiency, phenotype SS, Normal PFTs  anxiety  Hospital day # 3  Plan discussed with Dr. Corliss Skains on the unit. Discussed with the patient and answered all questions.  Stroke neurology service will sign off.  Please call with questions.  , MD Stroke Neurology Pager: [phone]

## 2020-05-22 NOTE — Progress Notes (Signed)
Inpatient Rehabilitation Admissions Coordinator  I met at bedside with patient to update on [facility] approval process. I will begin insurance approval for a possible CIR admit Friday pending insurance approval. She is in agreement.  Danne Baxter, RN, MSN Rehab Admissions Coordinator [phone] 05/22/2020 11:48 AM

## 2020-05-22 NOTE — Progress Notes (Signed)
NAME:  Jeanette Barnes, MRN:  [id number], DOB:  19-Oct-1955, LOS: 3 ADMISSION DATE:  05/18/2020, CONSULTATION DATE:  05/19/2020 REFERRING MD:  Dr. Loney Loh, [facility], CHIEF COMPLAINT:  Dizzy  Brief History:  65 yo female woke up in AM of 05/18/20 with lethargy, and dizziness.  In evening she had vomiting episode and fell to her left side in the bathroom.  Her daughter called EMS.  Found to have acute ischemic infarcts of Lt cerebellum, cerebellar vermis, Rt temporoccipital region, and Lt midbrain/pons.  Admitted to ICU for monitoring.  Past Medical History:  HTN, HLD, Vit D deficiency, Pulmonary nodule, A1AT SS, Anxiety  Significant Hospital Events:  3/06 Admit 3/09 4 vessel cerebral arteriogram 3/10 transfer to medical tele  Consults:  Neurology Neuro IR  Procedures:    Significant Diagnostic Tests:   MRI brain 3/07 >> acute ischemic infarcts of Lt cerebellum, cerebellar vermis, Rt temporoccipital region, and Lt midbrain/pons.  MRA head/neck 3/07 >> occlusion of Lt vertebral artery with concern for acute dissection  Echo 3/07 >> EF 60 to 65%, mild LVH, grade 1 DD, mild RV enlargement, RVSP 26.6 mmHg  Micro Data:  COVID 3/06 >> negative Flu PCR 3/06 >> negative MRSA PCR 3/06 >> negative  Antimicrobials:    Interim History / Subjective:  Reports getting headaches in the morning, and having sleep disruption.  Unsure if she snores.  Objective   Blood pressure (!) 150/71, pulse 69, temperature 97.6 F (36.4 C), temperature source Oral, resp. rate 17, weight 61.9 kg, SpO2 98 %.        Intake/Output Summary (Last 24 hours) at 05/22/2020 0818 Last data filed at 05/21/2020 1600 Gross per 24 hour  Intake 34.98 ml  Output -  Net 34.98 ml   Filed Weights   05/20/20 0500 05/22/20 0500  Weight: 61.7 kg 61.9 kg    Examination:  General - alert Eyes - pupils reactive ENT - no sinus tenderness, no stridor Cardiac - regular rate/rhythm, no murmur Chest - equal breath sounds b/l,  no wheezing or rales Abdomen - soft, non tender, + bowel sounds Extremities - no cyanosis, clubbing, or edema Skin - no rashes Neuro - normal strength, moves extremities, follows commands Psych - normal mood and behavior  Resolved Hospital Problem list     Assessment & Plan:   Acute ischemic infarcts of Lt cerebellum, cerebellar vermis, Rt temporoccipital region, and Lt midbrain/pons with 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 - PT/OT recommending CIR - continue ASA, plavix per neuro  Hx of hypertension with chronic diastolic CHF, HLD. - goal SBP < 160 - continue atorvastatin, HCTZ  Sleep disruption. - will need to have assessment for sleep apnea as an outpt  A1AT SS. - most recent PFT normal - followed by Dr. Isaiah Serge with [place]  Vitamin D deficiency. - continue vitamin D  D/w Dr. Herminio Heads practice (evaluated daily)  Diet: heart healthy diet DVT prophylaxis: SCDs GI prophylaxis: Protonix Mobility: as tolerated Disposition: medical telemetry Code Status: full  Will ask [facility] to assume care from 3/11 and PCCM sign off.  Labs    CMP Latest Ref Rng & Units 05/20/2020 05/18/2020 12/06/2019  Glucose 70 - 99 mg/dL 87 376(E) 90  BUN 8 - 23 mg/dL 13 12 13   Creatinine 0.44 - 1.00 mg/dL 8.31 5.17  Sodium 135 - 145 mmol/L 139 137 141  Potassium 3.5 - 5.1 mmol/L 4.4 2.7(LL) 3.8  Chloride 98 - 111 mmol/L 105 101 103  CO2  22 - 32 mmol/L 27 25 32  Calcium 8.9 - 10.3 mg/dL 9.3 9.2 9.7  Total Protein 6.0 - 8.3 g/dL - - 7.2  Total Bilirubin 0.2 - 1.2 mg/dL - - 0.3  Alkaline Phos 39 - 117 U/L - - 71  AST 0 - 37 U/L - - 25  ALT 0 - 35 U/L - - 25    CBC Latest Ref Rng & Units 05/20/2020 05/19/2020 05/18/2020  WBC 4.0 - 10.5 K/uL 6.7 10.8(H) 11.6(H)  Hemoglobin 12.0 - 15.0 g/dL 16.3 84.6 65.9  Hematocrit 36.0 - 46.0 % 37.5 36.4 38.6  Platelets 150 - 400 K/uL 196 215 206    Signature:  [name], MD [facility] Pager - [phone] 05/22/2020, 8:18 AM

## 2020-05-22 NOTE — Progress Notes (Addendum)
Physical Therapy Treatment Patient Details Name: Jeanette Barnes MRN: [id number] DOB: 02/25/1956 Today's Date: 05/22/2020    History of Present Illness Sanvika Cuttino is a 65 y.o. female with medical history significant of hypertension, hyperlipidemia presenting to the ED for evaluation of vertigo and ataxia. Fell with possibly hitting her head. MRI showing patchy acute ischemic nonhemorrhagic infarcts involving the left cerebellum, cerebellar vermis, right temporal occipital region, and left midbrain/pons.MRA head and neck showing findings concerning for acute extracranial vertebral artery dissection.  Underwent angiogram 3/9 with findings of left Vertebral artery occlusion with collaterals in place and right vertebral artery 30% blockage and pt to be treated medically.    PT Comments    Pt admitted with above diagnosis. Pt continues to be limited by dizziness at times due to left vestibular hypofunction and ataxia due to CVA multiple regions.  Pt was fully independent with full time job and is reliant on therapy for ambulation needing mod assist if pt is challenged with head turns when not using a device and needing min assist with RW with challenges.  Initiated x 1 exercises to address vestibular hypofunction.  Continue to recommend REhab so that pt can return to prior level of function.  Pt currently with functional limitations due to balance and endurance deficits. Pt will benefit from skilled PT to increase their independence and safety with mobility to allow discharge to the venue listed below.     Follow Up Recommendations  CIR     Equipment Recommendations  Other (comment) (RW vs. Rollator vs. Gilmer Mor)    Recommendations for Other Services Rehab consult     Precautions / Restrictions Precautions Precautions: Fall Precaution Comments: Pt with dizziness when moving head to left and when looking down. Restrictions Weight Bearing Restrictions: No    Mobility  Bed Mobility Overal bed  mobility: Needs Assistance Bed Mobility: Supine to Sit;Sit to Supine Rolling: Supervision Sidelying to sit: Supervision Supine to sit: Supervision     General bed mobility comments: No assist to sit EOB    Transfers Overall transfer level: Needs assistance Equipment used: 1 person hand held assist;Rolling walker (2 wheeled) Transfers: Sit to/from [employer] Sit to Stand: Min assist         General transfer comment: Pt needs steadying assist as she gets dizzy especially with head turns.  Pt needs RW for stability at present time.  Ambulation/Gait Ambulation/Gait assistance: +2 safety/equipment;Min assist;Mod assist Gait Distance (Feet): 200 Feet Assistive device: Rolling walker (2 wheeled);None Gait Pattern/deviations: Step-through pattern;Decreased stride length;Staggering right;Staggering left   Gait velocity interpretation: <1.31 ft/sec, indicative of household ambulator General Gait Details: Pt was able to walk on unit with min assist and RW and chair follow for safety.  discussed compensation by decr head turns as this incr vertigo as pt with suspected vestibular hypofunction.  Pt needed mod assist without device as pt does get unsteady without UE support.  HR from 102-120 bpm with other VSS. Pt experienced imbalance when turning head in both directions. Also noted that if pt looked around she was off balance with RW in use.  Trial of walk without RW and pt mod assist with challenges to balance.   Stairs             Wheelchair Mobility    Modified Rankin (Stroke Patients Only) Modified Rankin (Stroke Patients Only) Pre-Morbid Rankin Score: No symptoms Modified Rankin: No significant disability     Balance Overall balance assessment: Needs assistance Sitting-balance support: Feet supported Sitting balance-Leahy  Scale: Good     Standing balance support: Bilateral upper extremity supported Standing balance-Leahy Scale: Poor Standing balance  comment: Pt reliant on outside support to remain in standing due to dizziness when looking down or when turning head to the Left and right.  Pt takes small short steps when she becomes dizzy making pt a fall risk.                            Cognition Arousal/Alertness: Awake/alert Behavior During Therapy: WFL for tasks assessed/performed Overall Cognitive Status: Within Functional Limits for tasks assessed                             Awareness: Emergent          Exercises Other Exercises Other Exercises: Sit to stand with min guard assist x 10. Other Exercises: x 1 exercises with pt dizzy 6/10 with the exercise. Encouraged to complete exercises 3 x an hour side to side.    General Comments General comments (skin integrity, edema, etc.): Pt appears to have a left hypofunction of vestibular system per testing.      Pertinent Vitals/Pain Pain Assessment: No/denies pain    Home Living                      Prior Function            PT Goals (current goals can now be found in the care plan section) Acute Rehab PT Goals Patient Stated Goal: to be able to work (was suppose to start a job in 2 days from admit date) Progress towards PT goals: Progressing toward goals    Frequency    Min 4X/week      PT Plan Current plan remains appropriate    Co-evaluation              AM-PAC PT "6 Clicks" Mobility   Outcome Measure  Help needed turning from your back to your side while in a flat bed without using bedrails?: None Help needed moving from lying on your back to sitting on the side of a flat bed without using bedrails?: A Little Help needed moving to and from a bed to a chair (including a wheelchair)?: A Little Help needed standing up from a chair using your arms (e.g., wheelchair or bedside chair)?: A Little Help needed to walk in hospital room?: A Lot Help needed climbing 3-5 steps with a railing? : A Little 6 Click Score: 18     End of Session Equipment Utilized During Treatment: Gait belt Activity Tolerance: Patient tolerated treatment well Patient left: in chair;with call bell/phone within reach;with chair alarm set Nurse Communication: Mobility status PT Visit Diagnosis: History of falling (Z91.81);Unsteadiness on feet (R26.81)     Time: 0109-3235 PT Time Calculation (min) (ACUTE ONLY): 41 min  Charges:  $Gait Training: 8-22 mins $Therapeutic Exercise: 8-22 mins $Therapeutic Activity: 8-22 mins                     Ziad Maye M,PT Acute Rehab Services [phone] (pager)   Berline Lopes 05/22/2020, 11:35 AM

## 2020-05-23 ENCOUNTER — Inpatient Hospital Stay (HOSPITAL_COMMUNITY)
Admission: RE | Admit: 2020-05-23 | Discharge: 2020-05-25 | DRG: 092 | Disposition: A | Discharge status: Home / Self Care | Payer: Medicare HMO | Source: Intra-hospital | Attending: Physical Medicine & Rehabilitation | Admitting: Physical Medicine & Rehabilitation

## 2020-05-23 DIAGNOSIS — I341 Nonrheumatic mitral (valve) prolapse: Secondary | ICD-10-CM | POA: Diagnosis present

## 2020-05-23 DIAGNOSIS — I5032 Chronic diastolic (congestive) heart failure: Secondary | ICD-10-CM | POA: Diagnosis not present

## 2020-05-23 DIAGNOSIS — Z8249 Family history of ischemic heart disease and other diseases of the circulatory system: Secondary | ICD-10-CM | POA: Diagnosis not present

## 2020-05-23 DIAGNOSIS — E785 Hyperlipidemia, unspecified: Secondary | ICD-10-CM | POA: Diagnosis present

## 2020-05-23 DIAGNOSIS — R911 Solitary pulmonary nodule: Secondary | ICD-10-CM | POA: Diagnosis present

## 2020-05-23 DIAGNOSIS — Z8673 Personal history of transient ischemic attack (TIA), and cerebral infarction without residual deficits: Secondary | ICD-10-CM

## 2020-05-23 DIAGNOSIS — Z90710 Acquired absence of both cervix and uterus: Secondary | ICD-10-CM

## 2020-05-23 DIAGNOSIS — K5900 Constipation, unspecified: Secondary | ICD-10-CM | POA: Diagnosis present

## 2020-05-23 DIAGNOSIS — F419 Anxiety disorder, unspecified: Secondary | ICD-10-CM | POA: Diagnosis present

## 2020-05-23 DIAGNOSIS — E559 Vitamin D deficiency, unspecified: Secondary | ICD-10-CM | POA: Diagnosis present

## 2020-05-23 DIAGNOSIS — D62 Acute posthemorrhagic anemia: Secondary | ICD-10-CM

## 2020-05-23 DIAGNOSIS — R2689 Other abnormalities of gait and mobility: Secondary | ICD-10-CM | POA: Diagnosis present

## 2020-05-23 DIAGNOSIS — Z79899 Other long term (current) drug therapy: Secondary | ICD-10-CM

## 2020-05-23 DIAGNOSIS — I7774 Dissection of vertebral artery: Secondary | ICD-10-CM | POA: Diagnosis not present

## 2020-05-23 DIAGNOSIS — I63212 Cerebral infarction due to unspecified occlusion or stenosis of left vertebral arteries: Secondary | ICD-10-CM | POA: Diagnosis not present

## 2020-05-23 DIAGNOSIS — I10 Essential (primary) hypertension: Secondary | ICD-10-CM

## 2020-05-23 DIAGNOSIS — E8801 Alpha-1-antitrypsin deficiency: Secondary | ICD-10-CM | POA: Diagnosis present

## 2020-05-23 DIAGNOSIS — I63112 Cerebral infarction due to embolism of left vertebral artery: Secondary | ICD-10-CM | POA: Diagnosis not present

## 2020-05-23 LAB — CBC
HCT: 33.1 % — ABNORMAL LOW (ref 36.0–46.0)
Hemoglobin: 11.3 g/dL — ABNORMAL LOW (ref 12.0–15.0)
MCH: 30.2 pg (ref 26.0–34.0)
MCHC: 34.1 g/dL (ref 30.0–36.0)
MCV: 88.5 fL (ref 80.0–100.0)
Platelets: 177 10*3/uL (ref 150–400)
RBC: 3.74 MIL/uL — ABNORMAL LOW (ref 3.87–5.11)
RDW: 13 % (ref 11.5–15.5)
WBC: 7.1 10*3/uL (ref 4.0–10.5)
nRBC: 0 % (ref 0.0–0.2)

## 2020-05-23 LAB — BASIC METABOLIC PANEL
Anion gap: 9 (ref 5–15)
BUN: 12 mg/dL (ref 8–23)
CO2: 26 mmol/L (ref 22–32)
Calcium: 9.2 mg/dL (ref 8.9–10.3)
Chloride: 103 mmol/L (ref 98–111)
Creatinine, Ser: 0.81 mg/dL (ref 0.44–1.00)
GFR, Estimated: >60 mL/min (ref >60)
Glucose, Bld: 114 mg/dL — ABNORMAL HIGH (ref 70–99)
Potassium: 3.7 mmol/L (ref 3.5–5.1)
Sodium: 138 mmol/L (ref 135–145)

## 2020-05-23 MED ORDER — CLOPIDOGREL BISULFATE 75 MG PO TABS
75.0000 mg | ORAL_TABLET | Freq: Every day | ORAL | Status: DC
  Administered 2020-05-24 – 2020-05-25 (×2): 75 mg via ORAL
  Filled 2020-05-23 (×2): qty 1

## 2020-05-23 MED ORDER — CLOPIDOGREL BISULFATE 75 MG PO TABS: 75.0000 mg | ORAL_TABLET | Freq: Every day | ORAL | 0 refills | Status: DC

## 2020-05-23 MED ORDER — HYDROCHLOROTHIAZIDE 25 MG PO TABS: 25.0000 mg | ORAL_TABLET | Freq: Every day | ORAL | Status: DC

## 2020-05-23 MED ORDER — ACETAMINOPHEN 325 MG PO TABS: 650.0000 mg | ORAL_TABLET | ORAL | Status: DC | PRN

## 2020-05-23 MED ORDER — HYDROCHLOROTHIAZIDE 25 MG PO TABS
25.0000 mg | ORAL_TABLET | Freq: Every day | ORAL | Status: DC
  Administered 2020-05-24: 25 mg via ORAL
  Filled 2020-05-23: qty 1

## 2020-05-23 MED ORDER — POLYETHYLENE GLYCOL 3350 17 G PO PACK: 17.0000 g | PACK | Freq: Every day | ORAL | Status: DC | PRN

## 2020-05-23 MED ORDER — VITAMIN B-12 1000 MCG PO TABS
1000.0000 ug | ORAL_TABLET | Freq: Every day | ORAL | Status: DC
  Administered 2020-05-24 – 2020-05-25 (×2): 1000 ug via ORAL
  Filled 2020-05-23 (×2): qty 1

## 2020-05-23 MED ORDER — ASPIRIN 81 MG PO TBEC: 81.0000 mg | DELAYED_RELEASE_TABLET | Freq: Every day | ORAL | 11 refills | Status: DC

## 2020-05-23 MED ORDER — ATORVASTATIN CALCIUM 80 MG PO TABS: 80.0000 mg | ORAL_TABLET | Freq: Every day | ORAL | Status: DC

## 2020-05-23 MED ORDER — ASPIRIN EC 81 MG PO TBEC
81.0000 mg | DELAYED_RELEASE_TABLET | Freq: Every day | ORAL | Status: DC
  Administered 2020-05-24 – 2020-05-25 (×2): 81 mg via ORAL
  Filled 2020-05-23 (×2): qty 1

## 2020-05-23 MED ORDER — DOCUSATE SODIUM 100 MG PO CAPS: 100.0000 mg | ORAL_CAPSULE | Freq: Two times a day (BID) | ORAL | Status: DC | PRN

## 2020-05-23 MED ORDER — PANTOPRAZOLE SODIUM 40 MG PO TBEC
40.0000 mg | DELAYED_RELEASE_TABLET | Freq: Every day | ORAL | Status: DC
  Administered 2020-05-24 – 2020-05-25 (×2): 40 mg via ORAL
  Filled 2020-05-23 (×2): qty 1

## 2020-05-23 MED ORDER — CHOLECALCIFEROL 10 MCG (400 UNIT) PO TABS
400.0000 [IU] | ORAL_TABLET | Freq: Every day | ORAL | Status: DC
  Administered 2020-05-24 – 2020-05-25 (×2): 400 [IU] via ORAL
  Filled 2020-05-23 (×2): qty 1

## 2020-05-23 MED ORDER — ATORVASTATIN CALCIUM 80 MG PO TABS
80.0000 mg | ORAL_TABLET | Freq: Every day | ORAL | Status: DC
  Administered 2020-05-24 – 2020-05-25 (×2): 80 mg via ORAL
  Filled 2020-05-23 (×2): qty 1

## 2020-05-23 NOTE — Discharge Summary (Signed)
Physician Discharge Summary  Jeanette Barnes YSA:[id number] DOB: Jul 03, 1955 DOA: 05/18/2020  PCP: Macy Mis, MD  Admit date: 05/18/2020 Discharge date: 05/23/2020  Admitted From: Home Disposition: CIR  Recommendations for Outpatient Follow-up:  1. Follow ups as below. 2. Please obtain CBC/BMP/Mag at follow up 3. Please follow up on the following pending results: None  Discharge Condition: Stable CODE STATUS: Full code   Follow-up Information    [facility]. Schedule an appointment as soon as possible for a visit in 6 month(s).               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  She was outside the TPA window on arrival.  She was evaluated by neurology and interventional radiology.  She underwent image guided diagnostic cerebral arteriogram in IR with Dr. Nicki Reaper on 05/21/2020 that showed occluded origin of left vertebral artery, bilateral fibromuscular dysplastic changes involving the mid cervical internal carotid arteries (Lt>Rt) with 2 small pseudoaneurysms with the largest one measuring approximately 3.4 x 3.4 mm.  Her LDL was 160.  HDL of 66.  Hemoglobin A1c 5.5.  TTE with LVEF of 60 to 65%, G1 DD but no other significant finding.  Neurology recommended high intensity statin, DAPT with Plavix and aspirin for 3 months followed by aspirin alone, and outpatient event monitor for 30 days.  Salley Hews with cardiology contacted and will arrange out patient follow-up for 30-day event monitor once patient is discharged from CIR.  Interventional radiology recommended follow-up in 6 months with CTA neck and head.  Therapy recommended CIR for rehabilitation.  Discharge Diagnoses:  Acute ischemic infarcts of Lt  cerebellum, cerebellar vermis, Rt temporoccipital region, and Lt midbrain/pons with 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 -Work-up as above. -Neurology recommended DAPT with aspirin and Plavix for 3 months followed by aspirin alone -Continue high intensity statin -Cardiology to arrange event monitor -Outpatient follow-up with interventional radiology in 6 months  Hx of hypertension with chronic diastolic CHF, HLD.  Not on diuretics.  No cardiopulmonary symptoms.  Appears euvolemic. -Goal SBP < 160 -Start HCTZ in 2 days  Sleep disruption -Recommend outpatient follow-up at sleep clinic  A1AT SS. - most recent PFT normal - followed by Dr. Isaiah Serge with [facility]  Vitamin D deficiency. - continue vitamin D   Body mass index is 23.87 kg/m.            Discharge Exam: Vitals:   05/23/20 1000 05/23/20 1100  BP:    Pulse: 73 76  Resp: 18 (!) 25  Temp:  98 F (36.7 C)  SpO2: 97% 99%    GENERAL: No apparent distress.  Nontoxic. HEENT: MMM.  Vision and hearing grossly intact.  NECK: Supple.  No apparent JVD.  RESP: On RA.  No IWOB.  Fair aeration bilaterally. CVS:  RRR. Heart sounds normal.  ABD/GI/GU: Bowel sounds present. Soft. Non tender.  MSK/EXT:  Moves extremities. No apparent deformity. No edema.  SKIN: no apparent skin lesion or wound NEURO: Awake, alert and oriented appropriately. Speech clear. Cranial nerves II-XII  intact. Motor 5/5 in all muscle groups of UE and LE bilaterally, Normal tone. Light sensation intact in all dermatomes of upper and lower ext bilaterally. Patellar reflex symmetric.  No pronator drift.  Finger to nose intact. PSYCH: Calm. Normal affect.  Discharge Instructions  Discharge Instructions    Diet - low sodium heart healthy   Complete by: As directed    Increase activity slowly   Complete by: As directed    No wound care   Complete by: As directed      Allergies as of 05/23/2020   No Known Allergies      Medication List    STOP taking these medications   Biotin 1 MG Caps   naproxen sodium 220 MG tablet Commonly known as: ALEVE   OVER THE COUNTER MEDICATION   VITAMIN E PO     TAKE these medications   acetaminophen 500 MG tablet Commonly known as: TYLENOL Take 1,000 mg by mouth every 6 (six) hours as needed for mild pain or headache.   albuterol 108 (90 Base) MCG/ACT inhaler Commonly known as: VENTOLIN HFA Inhale 2 puffs into the lungs every 6 (six) hours as needed for wheezing or shortness of breath.   aspirin 81 MG EC tablet Take 1 tablet (81 mg total) by mouth daily. Swallow whole. Start taking on: May 24, 2020   atorvastatin 80 MG tablet Commonly known as: LIPITOR Take 1 tablet (80 mg total) by mouth daily. Start taking on: May 24, 2020   cholecalciferol 10 MCG (400 UNIT) Tabs tablet Commonly known as: VITAMIN D3 Take 400 Units by mouth.   clopidogrel 75 MG tablet Commonly known as: PLAVIX Take 1 tablet (75 mg total) by mouth daily. Start taking on: May 24, 2020   hydrochlorothiazide 25 MG tablet Commonly known as: HYDRODIURIL Take 1 tablet (25 mg total) by mouth daily. Start taking on: May 26, 2020 What changed: These instructions start on May 26, 2020. If you are unsure what to do until then, ask your doctor or other care provider.   vitamin B-12 1000 MCG tablet Commonly known as: CYANOCOBALAMIN Take 1,000 mcg by mouth daily.       Consultations:  Neurology  Interventional radiology  Cardiology  Procedures/Studies:  2D Echo on 05/19/2020 1. Left ventricular ejection fraction, by estimation, is 60 to 65%. The  left ventricle has normal function. The left ventricle has no regional  wall motion abnormalities. There is mild concentric left ventricular  hypertrophy with more notable, discrete  thickening of the basal-septal segment. Left ventricular diastolic  parameters are consistent with Grade I diastolic dysfunction (impaired   relaxation).  2. Right ventricular systolic function is normal. The right ventricular  size is mildly enlarged. There is normal pulmonary artery systolic  pressure. The estimated right ventricular systolic pressure is 26.6 mmHg.  3. The mitral valve is grossly normal. Trivial mitral valve  regurgitation.  4. The aortic valve is tricuspid. Aortic valve regurgitation is not  visualized. No aortic stenosis is present.  5. The inferior vena cava is normal in size with greater than 50%  respiratory variability, suggesting right atrial pressure of 3 mmHg.    MR ANGIO HEAD WO CONTRAST  Result Date: 05/19/2020 CLINICAL DATA:  Initial evaluation for neuro deficit, stroke suspected, vertigo. EXAM: MRI HEAD WITHOUT CONTRAST MRA HEAD WITHOUT CONTRAST MRA NECK WITHOUT AND WITH CONTRAST TECHNIQUE: Multiplanar, multiecho pulse sequences of the brain and surrounding structures were obtained without intravenous contrast. Angiographic images of the Circle of Willis were obtained using MRA technique without intravenous contrast. Angiographic images of the neck were obtained using MRA technique without and with intravenous contrast. Carotid stenosis measurements (when applicable) are obtained utilizing NASCET criteria, using  the distal internal carotid diameter as the denominator. CONTRAST:  6.15mL GADAVIST GADOBUTROL 1 MMOL/ML IV SOLN COMPARISON:  Prior CT from 03/30/2018. FINDINGS: MRI HEAD FINDINGS Brain: Cerebral volume within normal limits for patient age. No significant cerebral white matter disease for age. Patchy multifocal areas of restricted diffusion seen involving the left cerebellum, most pronounced inferiorly. Minimal patchy involvement of the central cerebellar vermis. Additional patchy small volume areas of restricted diffusion seen involving the paramedian right temporal occipital region. Mild diffusion abnormality also noted at the left midbrain/pons. No associated hemorrhage or mass effect.  Otherwise, gray-white matter differentiation well maintained. No other areas of chronic cortical infarction. No foci of susceptibility artifact to suggest acute or chronic intracranial hemorrhage. No mass lesion, midline shift or mass effect. No hydrocephalus or extra-axial fluid collection. Pituitary gland suprasellar region normal. Midline structures intact. Vascular: Major intracranial vascular flow voids are maintained. Skull and upper cervical spine: Craniocervical junction within normal limits. Bone marrow signal intensity normal. No scalp soft tissue abnormality. Reversal of the normal upper cervical lordosis noted. Sinuses/Orbits: Globes and orbital soft tissues demonstrate no acute finding. Paranasal sinuses are clear. No mastoid effusion. Inner ear structures grossly normal. Other: None. MRA HEAD FINDINGS ANTERIOR CIRCULATION: Visualized distal cervical segments are both widely patent with antegrade flow. Petrous, cavernous, and supraclinoid segments patent without stenosis or other abnormality. A1 segments widely patent. Normal anterior communicating artery complex. Anterior cerebral arteries patent to their distal aspects without stenosis. No M1 stenosis or occlusion. Normal MCA bifurcations. Distal MCA branches well perfused and symmetric. POSTERIOR CIRCULATION: Dominant right vertebral artery widely patent to the vertebrobasilar junction. Right PICA patent and well perfused. Faint flow seen within the partially visualized distal left vertebral artery, with relatively absent flow within the left V3 segment. The left vertebral artery appears occluded as it courses into the cranial vault. Attenuated irregular flow seen within the left V4 segment distally, and remains grossly patent to the vertebrobasilar junction. Left PICA grossly patent at its origin. Basilar patent to its distal aspect without stenosis or visible abnormality. Right superior cerebral artery widely patent. Left SCA faintly visible and  appears grossly patent on time-of-flight sequence, not seen on MIP reconstructions. Both PCAs primarily supplied via the basilar. PCAs appear well perfused to their distal aspects. No intracranial aneurysm. MRA NECK FINDINGS AORTIC ARCH: Visualized aortic arch normal in caliber with normal 3 vessel morphology. No hemodynamically significant stenosis seen about the origin of the great vessels. RIGHT CAROTID SYSTEM: Right CCA patent from its origin to the bifurcation without stenosis. Mild for age atheromatous irregularity about the right carotid bulb with no more than mild 20-25% stenosis by NASCET criteria. Right ICA patent distally without stenosis, evidence for dissection or occlusion. LEFT CAROTID SYSTEM: Left common carotid artery widely patent from its origin to the bifurcation. Mild for age atheromatous irregularity about the left carotid bulb with no more than mild 20-25% by NASCET criteria. Left ICA otherwise patent without stenosis, evidence for dissection, or occlusion. Minimal irregularity about the mid cervical left ICA noted. VERTEBRAL ARTERIES: Both vertebral arteries arise from the subclavian arteries. Right vertebral artery likely dominant. There is a short-segment moderate approximate 50% stenosis involving the pre foraminal right V1 segment (series 1058, image 10). Otherwise, the right vertebral artery is widely patent within the neck without stenosis, evidence for dissection, or occlusion. On the left, the left vertebral artery appears occluded at its origin (series 18, image 32). Distally, the left ICA is markedly attenuated and diminutive, with near  occlusion at its distal V2 segment. Some flow is seen within the left V3 segment, but is also markedly attenuated and irregular. Given the presence of acute posterior circulation infarcts, finding raises the possibility for an acute dissection. IMPRESSION: MRI HEAD IMPRESSION: 1. Patchy acute ischemic nonhemorrhagic infarcts involving the left  cerebellum, cerebellar vermis, right temporoccipital region, and left midbrain/pons. 2. Otherwise normal brain MRI for age. MRA HEAD IMPRESSION: 1. Occlusion of the left vertebral artery with concern for acute dissection as below. Attenuated irregular flow within the left V4 segment distally may in part be retrograde in nature. Dominant right vertebral artery widely patent. 2. Poor visualization of the left SCA, which also may be partially occluded given the distribution of infarcts. 3. Widely patent anterior circulation. MRA NECK IMPRESSION: 1. 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 Given the presence of acute posterior circulation infarcts, these findings raise the possibility for an acute arterial dissection. Correlation with dedicated CTA could be performed for further evaluation as warranted. 2. Short-segment moderate approximate 50% stenosis involving the pre foraminal right V1 segment. Otherwise wide patency of the dominant right vertebral artery. 3. Mild for age atheromatous change about the carotid bifurcations without significant stenosis. Otherwise wide patency of both carotid artery systems in the neck. Critical Value/emergent results were called by telephone at the time of interpretation on 05/19/2020 at 1:34 a.m. to provider Dr. Marily Memos, who verbally acknowledged these results. Electronically Signed   By: Rise Mu M.D.   On: 05/19/2020 01:56   MR ANGIO NECK W WO CONTRAST  Result Date: 05/19/2020 CLINICAL DATA:  Initial evaluation for neuro deficit, stroke suspected, vertigo. EXAM: MRI HEAD WITHOUT CONTRAST MRA HEAD WITHOUT CONTRAST MRA NECK WITHOUT AND WITH CONTRAST TECHNIQUE: Multiplanar, multiecho pulse sequences of the brain and surrounding structures were obtained without intravenous contrast. Angiographic images of the Circle of Willis were obtained using MRA technique  without intravenous contrast. Angiographic images of the neck were obtained using MRA technique without and with intravenous contrast. Carotid stenosis measurements (when applicable) are obtained utilizing NASCET criteria, using the distal internal carotid diameter as the denominator. CONTRAST:  6.73mL GADAVIST GADOBUTROL 1 MMOL/ML IV SOLN COMPARISON:  Prior CT from 03/30/2018. FINDINGS: MRI HEAD FINDINGS Brain: Cerebral volume within normal limits for patient age. No significant cerebral white matter disease for age. Patchy multifocal areas of restricted diffusion seen involving the left cerebellum, most pronounced inferiorly. Minimal patchy involvement of the central cerebellar vermis. Additional patchy small volume areas of restricted diffusion seen involving the paramedian right temporal occipital region. Mild diffusion abnormality also noted at the left midbrain/pons. No associated hemorrhage or mass effect. Otherwise, gray-white matter differentiation well maintained. No other areas of chronic cortical infarction. No foci of susceptibility artifact to suggest acute or chronic intracranial hemorrhage. No mass lesion, midline shift or mass effect. No hydrocephalus or extra-axial fluid collection. Pituitary gland suprasellar region normal. Midline structures intact. Vascular: Major intracranial vascular flow voids are maintained. Skull and upper cervical spine: Craniocervical junction within normal limits. Bone marrow signal intensity normal. No scalp soft tissue abnormality. Reversal of the normal upper cervical lordosis noted. Sinuses/Orbits: Globes and orbital soft tissues demonstrate no acute finding. Paranasal sinuses are clear. No mastoid effusion. Inner ear structures grossly normal. Other: None. MRA HEAD FINDINGS ANTERIOR CIRCULATION: Visualized distal cervical segments are both widely patent with antegrade flow. Petrous, cavernous, and supraclinoid segments patent without stenosis or  other abnormality. A1  segments widely patent. Normal anterior communicating artery complex. Anterior cerebral arteries patent to their distal aspects without stenosis. No M1 stenosis or occlusion. Normal MCA bifurcations. Distal MCA branches well perfused and symmetric. POSTERIOR CIRCULATION: Dominant right vertebral artery widely patent to the vertebrobasilar junction. Right PICA patent and well perfused. Faint flow seen within the partially visualized distal left vertebral artery, with relatively absent flow within the left V3 segment. The left vertebral artery appears occluded as it courses into the cranial vault. Attenuated irregular flow seen within the left V4 segment distally, and remains grossly patent to the vertebrobasilar junction. Left PICA grossly patent at its origin. Basilar patent to its distal aspect without stenosis or visible abnormality. Right superior cerebral artery widely patent. Left SCA faintly visible and appears grossly patent on time-of-flight sequence, not seen on MIP reconstructions. Both PCAs primarily supplied via the basilar. PCAs appear well perfused to their distal aspects. No intracranial aneurysm. MRA NECK FINDINGS AORTIC ARCH: Visualized aortic arch normal in caliber with normal 3 vessel morphology. No hemodynamically significant stenosis seen about the origin of the great vessels. RIGHT CAROTID SYSTEM: Right CCA patent from its origin to the bifurcation without stenosis. Mild for age atheromatous irregularity about the right carotid bulb with no more than mild 20-25% stenosis by NASCET criteria. Right ICA patent distally without stenosis, evidence for dissection or occlusion. LEFT CAROTID SYSTEM: Left common carotid artery widely patent from its origin to the bifurcation. Mild for age atheromatous irregularity about the left carotid bulb with no more than mild 20-25% by NASCET criteria. Left ICA otherwise patent without stenosis, evidence for dissection, or occlusion. Minimal irregularity about the  mid cervical left ICA noted. VERTEBRAL ARTERIES: Both vertebral arteries arise from the subclavian arteries. Right vertebral artery likely dominant. There is a short-segment moderate approximate 50% stenosis involving the pre foraminal right V1 segment (series 1058, image 10). Otherwise, the right vertebral artery is widely patent within the neck without stenosis, evidence for dissection, or occlusion. On the left, the left vertebral artery appears occluded at its origin (series 18, image 32). Distally, the left ICA is markedly attenuated and diminutive, with near occlusion at its distal V2 segment. Some flow is seen within the left V3 segment, but is also markedly attenuated and irregular. Given the presence of acute posterior circulation infarcts, finding raises the possibility for an acute dissection. IMPRESSION: MRI HEAD IMPRESSION: 1. Patchy acute ischemic nonhemorrhagic infarcts involving the left cerebellum, cerebellar vermis, right temporoccipital region, and left midbrain/pons. 2. Otherwise normal brain MRI for age. MRA HEAD IMPRESSION: 1. Occlusion of the left vertebral artery with concern for acute dissection as below. Attenuated irregular flow within the left V4 segment distally may in part be retrograde in nature. Dominant right vertebral artery widely patent. 2. Poor visualization of the left SCA, which also may be partially occluded given the distribution of infarcts. 3. Widely patent anterior circulation. MRA NECK IMPRESSION: 1. 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 Given the presence of acute posterior circulation infarcts, these findings raise the possibility for an acute arterial dissection. Correlation with dedicated CTA could be performed for further evaluation as warranted. 2. Short-segment moderate approximate 50% stenosis involving the pre foraminal right V1 segment. Otherwise wide  patency of the dominant right vertebral artery. 3. Mild for age atheromatous change about the carotid bifurcations without significant stenosis. Otherwise wide patency of both carotid  artery systems in the neck. Critical Value/emergent results were called by telephone at the time of interpretation on 05/19/2020 at 1:34 a.m. to provider Dr. Marily Memos, who verbally acknowledged these results. Electronically Signed   By: Rise Mu M.D.   On: 05/19/2020 01:56   MR BRAIN WO CONTRAST  Result Date: 05/19/2020 CLINICAL DATA:  Initial evaluation for neuro deficit, stroke suspected, vertigo. EXAM: MRI HEAD WITHOUT CONTRAST MRA HEAD WITHOUT CONTRAST MRA NECK WITHOUT AND WITH CONTRAST TECHNIQUE: Multiplanar, multiecho pulse sequences of the brain and surrounding structures were obtained without intravenous contrast. Angiographic images of the Circle of Willis were obtained using MRA technique without intravenous contrast. Angiographic images of the neck were obtained using MRA technique without and with intravenous contrast. Carotid stenosis measurements (when applicable) are obtained utilizing NASCET criteria, using the distal internal carotid diameter as the denominator. CONTRAST:  6.19mL GADAVIST GADOBUTROL 1 MMOL/ML IV SOLN COMPARISON:  Prior CT from 03/30/2018. FINDINGS: MRI HEAD FINDINGS Brain: Cerebral volume within normal limits for patient age. No significant cerebral white matter disease for age. Patchy multifocal areas of restricted diffusion seen involving the left cerebellum, most pronounced inferiorly. Minimal patchy involvement of the central cerebellar vermis. Additional patchy small volume areas of restricted diffusion seen involving the paramedian right temporal occipital region. Mild diffusion abnormality also noted at the left midbrain/pons. No associated hemorrhage or mass effect. Otherwise, gray-white matter differentiation well maintained. No other areas of chronic cortical infarction. No  foci of susceptibility artifact to suggest acute or chronic intracranial hemorrhage. No mass lesion, midline shift or mass effect. No hydrocephalus or extra-axial fluid collection. Pituitary gland suprasellar region normal. Midline structures intact. Vascular: Major intracranial vascular flow voids are maintained. Skull and upper cervical spine: Craniocervical junction within normal limits. Bone marrow signal intensity normal. No scalp soft tissue abnormality. Reversal of the normal upper cervical lordosis noted. Sinuses/Orbits: Globes and orbital soft tissues demonstrate no acute finding. Paranasal sinuses are clear. No mastoid effusion. Inner ear structures grossly normal. Other: None. MRA HEAD FINDINGS ANTERIOR CIRCULATION: Visualized distal cervical segments are both widely patent with antegrade flow. Petrous, cavernous, and supraclinoid segments patent without stenosis or other abnormality. A1 segments widely patent. Normal anterior communicating artery complex. Anterior cerebral arteries patent to their distal aspects without stenosis. No M1 stenosis or occlusion. Normal MCA bifurcations. Distal MCA branches well perfused and symmetric. POSTERIOR CIRCULATION: Dominant right vertebral artery widely patent to the vertebrobasilar junction. Right PICA patent and well perfused. Faint flow seen within the partially visualized distal left vertebral artery, with relatively absent flow within the left V3 segment. The left vertebral artery appears occluded as it courses into the cranial vault. Attenuated irregular flow seen within the left V4 segment distally, and remains grossly patent to the vertebrobasilar junction. Left PICA grossly patent at its origin. Basilar patent to its distal aspect without stenosis or visible abnormality. Right superior cerebral artery widely patent. Left SCA faintly visible and appears grossly patent on time-of-flight sequence, not seen on MIP reconstructions. Both PCAs primarily supplied  via the basilar. PCAs appear well perfused to their distal aspects. No intracranial aneurysm. MRA NECK FINDINGS AORTIC ARCH: Visualized aortic arch normal in caliber with normal 3 vessel morphology. No hemodynamically significant stenosis seen about the origin of the great vessels. RIGHT CAROTID SYSTEM: Right CCA patent from its origin to the bifurcation without stenosis. Mild for age atheromatous irregularity about the right carotid bulb with no more than mild 20-25% stenosis by NASCET criteria. Right ICA patent distally without  stenosis, evidence for dissection or occlusion. LEFT CAROTID SYSTEM: Left common carotid artery widely patent from its origin to the bifurcation. Mild for age atheromatous irregularity about the left carotid bulb with no more than mild 20-25% by NASCET criteria. Left ICA otherwise patent without stenosis, evidence for dissection, or occlusion. Minimal irregularity about the mid cervical left ICA noted. VERTEBRAL ARTERIES: Both vertebral arteries arise from the subclavian arteries. Right vertebral artery likely dominant. There is a short-segment moderate approximate 50% stenosis involving the pre foraminal right V1 segment (series 1058, image 10). Otherwise, the right vertebral artery is widely patent within the neck without stenosis, evidence for dissection, or occlusion. On the left, the left vertebral artery appears occluded at its origin (series 18, image 32). Distally, the left ICA is markedly attenuated and diminutive, with near occlusion at its distal V2 segment. Some flow is seen within the left V3 segment, but is also markedly attenuated and irregular. Given the presence of acute posterior circulation infarcts, finding raises the possibility for an acute dissection. IMPRESSION: MRI HEAD IMPRESSION: 1. Patchy acute ischemic nonhemorrhagic infarcts involving the left cerebellum, cerebellar vermis, right temporoccipital region, and left midbrain/pons. 2. Otherwise normal brain MRI for  age. MRA HEAD IMPRESSION: 1. Occlusion of the left vertebral artery with concern for acute dissection as below. Attenuated irregular flow within the left V4 segment distally may in part be retrograde in nature. Dominant right vertebral artery widely patent. 2. Poor visualization of the left SCA, which also may be partially occluded given the distribution of infarcts. 3. Widely patent anterior circulation. MRA NECK IMPRESSION: 1. 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 Given the presence of acute posterior circulation infarcts, these findings raise the possibility for an acute arterial dissection. Correlation with dedicated CTA could be performed for further evaluation as warranted. 2. Short-segment moderate approximate 50% stenosis involving the pre foraminal right V1 segment. Otherwise wide patency of the dominant right vertebral artery. 3. Mild for age atheromatous change about the carotid bifurcations without significant stenosis. Otherwise wide patency of both carotid artery systems in the neck. Critical Value/emergent results were called by telephone at the time of interpretation on 05/19/2020 at 1:34 a.m. to provider Dr. Marily Memos, who verbally acknowledged these results. Electronically Signed   By: Rise Mu M.D.   On: 05/19/2020 01:56   IR US Guide Vasc Access Right  Result Date: 05/23/2020 CLINICAL DATA:  Posterior fossa ischemic strokes. 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 EXAM: IR ANGIO EXTERNAL CAROTID SEL EXT CAROTID BILAT MOD SED COMPARISON:  MRI MRA of the brain of May 19, 2020. MEDICATIONS: Heparin 2000 units intra-arterially. No antibiotic was administered within 1 hour of the procedure. ANESTHESIA/SEDATION: Versed 1 mg IV; Fentanyl 25 mcg IV Moderate Sedation Time:  69 minutes The patient was continuously monitored during the  procedure by the interventional radiology nurse under my direct supervision. CONTRAST:  Isovue 300 approximately 100 mL FLUOROSCOPY TIME:  Fluoroscopy Time: 23 minutes 18 seconds (857 mGy). COMPLICATIONS: None immediate. TECHNIQUE: Informed written consent was obtained from the patient after a thorough discussion of the procedural risks, benefits and alternatives. All questions were addressed. Maximal Sterile Barrier Technique was utilized including caps, mask, sterile gowns, sterile gloves, sterile drape, hand hygiene and skin antiseptic. A timeout was performed prior to the initiation of the procedure. The right forearm to the wrist was prepped  and draped in the usual sterile manner. The right radial artery was then identified with ultrasound, and its morphology documented. A dorsal palmar anastomosis was verified to be present. Using ultrasound guidance, radial access was obtained with a micropuncture set. Over a 0.018 inch micro guidewire, a 4/5 French radial sheath was inserted. The obturator, and the micro guidewire were removed. Good aspiration obtained from the side port of the sheath. A cocktail of 2000 units of heparin, and 200 mcg of nitroglycerin was then infused in diluted form through the sheath without event. A right radial arteriogram was performed. Over a 0.035 inch Roadrunner guidewire, a 5 [place] Simmons 2 diagnostic catheter was advanced to the aortic arch region, and selectively positioned in the right vertebral artery, the right common carotid artery. However, access to the left common carotid artery and the left subclavian artery could not be obtained via the radial approach due to the aortic arch anatomy. A wrist band was then applied at the right radial puncture site for hemostasis. The right radial pulse was verified to be present. The right common femoral artery approach was then utilized. Over a 0.035 inch guidewire, a 5 French Pinnacle sheath was inserted. Through this and also over a  0.035 inch guidewire, a 5 [place] JB 1 catheter was advanced to the aortic arch region and selectively positioned in the left common carotid artery and the left subclavian artery. FINDINGS: The right vertebral artery origin is widely patent. There is a 30% stenosis of the proximal right vertebral artery secondary to a smooth circumferential plaque. More distally the vessel is seen to opacify to the cranial skull base. Patency is seen of the right vertebrobasilar junction and the right posterior-inferior cerebellar artery. The basilar artery, the posterior cerebral arteries, the superior cerebellar arteries and the anterior-inferior cerebellar arteries opacify into the capillary and venous phases. Flash filling of the left vertebrobasilar junction from the right vertebral artery injection is noted. The right common carotid arteriogram demonstrates the origin of the right external carotid artery and its major branches to be widely patent. The right internal carotid artery at the bulb has a smooth shallow atherosclerotic plaque without evidence of ulcerations or of intraluminal filling defects. The vessel is seen to opacify to the cranial skull base with mild FMD- like changes in the mid cervical segment. Distally the petrous, cavernous and supraclinoid segments are widely patent. Flash filling of the right posterior communicating artery and the right posterior cerebral artery is seen. The right middle cerebral artery and the right anterior cerebral artery opacify into the capillary and venous phases. The left common carotid arteriogram demonstrates the left external carotid artery and its major branches to be widely patent. The left internal carotid artery at the bulb and its proximal 1/3 is widely patent. The mid 1/3 segment of the left internal carotid artery demonstrates focal areas of smooth outpouching without intraluminal narrowing. Two focal outpouchings are seen arising at this site. The proximal one measures  approximately 1.8 mm, whilst the distal one measures approximately 3.4 mm x 3.4 mm. 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 Distal to this the distal cervical, the petrous cavernous and the supraclinoid left ICA are widely patent. The left middle cerebral artery and the left anterior cerebral artery opacify into the capillary and venous phases. Again noted is flash filling of the left posterior cerebral artery via the left posterior communicating artery. A left subclavian arteriogram demonstrates of the origin of the left vertebral artery with a very fine  string sign. There is contrast noted just distal to this. More distally the left vertebral artery is non-opacified. There is suggestion of delayed opacification of the of the left vertebrobasilar junction from muscular collaterals arising from the ascending cervical branch of the of the left thyrocervical trunk. IMPRESSION: Approximately 30% stenosis of the proximal right vertebral artery secondary to a smooth circumferential soft atherosclerotic plaque. 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 See discussion above. PLAN: Findings reviewed with the patient and referring neurologist. Follow-up CT angiogram of the head and neck in 6 months time. Electronically Signed   By: Julieanne Cotton M.D.   On: 05/22/2020 08:34   DG Chest Portable 1 View  Result Date: 05/18/2020 CLINICAL DATA:  Dizziness for several hours EXAM: PORTABLE CHEST 1 VIEW COMPARISON:  12/26/2019 CT  FINDINGS: The heart size and mediastinal contours are within normal limits. Both lungs are clear. The visualized skeletal structures are unremarkable. IMPRESSION: No active disease. Electronically Signed   By: Alcide Clever M.D.   On: 05/18/2020 22:22   ECHOCARDIOGRAM COMPLETE  Result Date: 05/19/2020    ECHOCARDIOGRAM REPORT   Patient Name:   SUA SPADAFORA Date of Exam: 05/19/2020 Medical Rec #:  [id number]      Height:       63.5 in Accession #:    [id number]     Weight:       136.6 lb Date of Birth:  1955/04/08      BSA:          1.654 m Patient Age:    65 years       BP:           129/53 mmHg Patient Gender: F              HR:           63 bpm. Exam Location:  Inpatient Procedure: 2D Echo Indications:    stroke  History:        Patient has no prior history of Echocardiogram examinations.                 Risk Factors:Hypertension.  Sonographer:    Delcie Roch Referring Phys: [id number] VASUNDHRA RATHORE IMPRESSIONS  1. Left ventricular ejection fraction, by estimation, is 60 to 65%. The left ventricle has normal function. The left ventricle has no regional wall motion abnormalities. There is mild concentric left ventricular hypertrophy with more notable, discrete thickening of the basal-septal segment. Left ventricular diastolic parameters are consistent with Grade I diastolic dysfunction (impaired relaxation).  2. Right ventricular systolic function is normal. The right ventricular size is mildly enlarged. There is normal pulmonary artery systolic pressure. The estimated right ventricular systolic pressure is 26.6 mmHg.  3. The mitral valve is grossly normal. Trivial mitral valve regurgitation.  4. The aortic valve is tricuspid. Aortic valve regurgitation is not visualized. No aortic stenosis is present.  5. The inferior vena cava is normal in size with greater than 50% respiratory variability, suggesting right atrial pressure of 3 mmHg. Comparison(s): No prior Echocardiogram. Conclusion(s)/Recommendation(s):  No intracardiac source of embolism detected on this transthoracic study. A  transesophageal echocardiogram is recommended to exclude cardiac source of embolism if clinically indicated. FINDINGS  Left Ventricle: Left ventricular ejection fraction, by estimation, is 60 to 65%. The left ventricle has normal function. The left ventricle has no regional wall motion abnormalities. The left ventricular internal cavity size was normal in size. There is  mild concentric left ventricular hypertrophy with more notable, discrete thickening of the basal-septal segment. Left ventricular diastolic parameters are consistent with Grade I diastolic dysfunction (impaired relaxation). Right Ventricle: The right ventricular size is mildly enlarged. No increase in right ventricular wall thickness. Right ventricular systolic function is normal. There is normal pulmonary artery systolic pressure. The tricuspid regurgitant velocity is 2.43  m/s, and with an assumed right atrial pressure of 3 mmHg, the estimated right ventricular systolic pressure is 26.6 mmHg. Left Atrium: Left atrial size was normal in size. Right Atrium: Right atrial size was normal in size. Pericardium: There is no evidence of pericardial effusion. Mitral Valve: The mitral valve is grossly normal. There is mild thickening of the mitral valve leaflet(s). There is mild calcification of the mitral valve leaflet(s). Trivial mitral valve regurgitation. Tricuspid Valve: The tricuspid valve is normal in structure. Tricuspid valve regurgitation is mild. Aortic Valve: The aortic valve is tricuspid. Aortic valve regurgitation is not visualized. No aortic stenosis is present. Pulmonic Valve: The pulmonic valve was not well visualized. Pulmonic valve regurgitation is trivial. Aorta: The aortic root and ascending aorta are structurally normal, with no evidence of dilitation. Venous: The inferior vena cava is normal in size with greater than 50% respiratory variability, suggesting  right atrial pressure of 3 mmHg. IAS/Shunts: No atrial level shunt detected by color flow Doppler.  LEFT VENTRICLE PLAX 2D LVIDd:         4.10 cm Diastology LVIDs:         2.30 cm LV e' medial:    6.74 cm/s LV PW:         0.80 cm LV E/e' medial:  13.5 LV IVS:        0.80 cm LV e' lateral:   9.46 cm/s                        LV E/e' lateral: 9.6  RIGHT VENTRICLE             IVC RV S prime:     20.70 cm/s  IVC diam: 1.40 cm TAPSE (M-mode): 2.5 cm LEFT ATRIUM             Index       RIGHT ATRIUM          Index LA diam:        3.80 cm 2.30 cm/m  RA Area:     9.67 cm LA Vol (A2C):   29.9 ml 18.08 ml/m RA Volume:   18.90 ml 11.43 ml/m LA Vol (A4C):   27.8 ml 16.81 ml/m LA Biplane Vol: 28.8 ml 17.41 ml/m  AORTIC VALVE LVOT Vmax:   80.80 cm/s LVOT Vmean:  55.900 cm/s LVOT VTI:    0.183 m  AORTA Ao Asc diam: 2.90 cm MITRAL VALVE               TRICUSPID VALVE MV Area (PHT): 3.85 cm    TR Peak grad:   23.6 mmHg MV Decel Time: 197 msec    TR Vmax:        243.00 cm/s MV E velocity: 90.80 cm/s MV A velocity: 98.50 cm/s  SHUNTS  MV E/A ratio:  0.92        Systemic VTI: 0.18 m Laurance Flatten MD Electronically signed by Laurance Flatten MD Signature Date/Time: 05/19/2020/3:50:16 PM    Final    IR ANGIO VERTEBRAL SEL SUBCLAVIAN INNOMINATE UNI L MOD SED  Result Date: 05/23/2020 CLINICAL DATA:  Posterior fossa ischemic strokes. 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 EXAM: IR ANGIO EXTERNAL CAROTID SEL EXT CAROTID BILAT MOD SED COMPARISON:  MRI MRA of the brain of May 19, 2020. MEDICATIONS: Heparin 2000 units intra-arterially. No antibiotic was administered within 1 hour of the procedure. ANESTHESIA/SEDATION: Versed 1 mg IV; Fentanyl 25 mcg IV Moderate Sedation Time:  69 minutes The patient was continuously monitored during the procedure by the interventional radiology nurse under my direct supervision. CONTRAST:  Isovue 300 approximately 100 mL FLUOROSCOPY TIME:  Fluoroscopy Time: 23  minutes 18 seconds (857 mGy). COMPLICATIONS: None immediate. TECHNIQUE: Informed written consent was obtained from the patient after a thorough discussion of the procedural risks, benefits and alternatives. All questions were addressed. Maximal Sterile Barrier Technique was utilized including caps, mask, sterile gowns, sterile gloves, sterile drape, hand hygiene and skin antiseptic. A timeout was performed prior to the initiation of the procedure. The right forearm to the wrist was prepped and draped in the usual sterile manner. The right radial artery was then identified with ultrasound, and its morphology documented. A dorsal palmar anastomosis was verified to be present. Using ultrasound guidance, radial access was obtained with a micropuncture set. Over a 0.018 inch micro guidewire, a 4/5 French radial sheath was inserted. The obturator, and the micro guidewire were removed. Good aspiration obtained from the side port of the sheath. A cocktail of 2000 units of heparin, and 200 mcg of nitroglycerin was then infused in diluted form through the sheath without event. A right radial arteriogram was performed. Over a 0.035 inch Roadrunner guidewire, a 5 [place] Simmons 2 diagnostic catheter was advanced to the aortic arch region, and selectively positioned in the right vertebral artery, the right common carotid artery. However, access to the left common carotid artery and the left subclavian artery could not be obtained via the radial approach due to the aortic arch anatomy. A wrist band was then applied at the right radial puncture site for hemostasis. The right radial pulse was verified to be present. The right common femoral artery approach was then utilized. Over a 0.035 inch guidewire, a 5 French Pinnacle sheath was inserted. Through this and also over a 0.035 inch guidewire, a 5 [place] JB 1 catheter was advanced to the aortic arch region and selectively positioned in the left common carotid artery and the left  subclavian artery. FINDINGS: The right vertebral artery origin is widely patent. There is a 30% stenosis of the proximal right vertebral artery secondary to a smooth circumferential plaque. More distally the vessel is seen to opacify to the cranial skull base. Patency is seen of the right vertebrobasilar junction and the right posterior-inferior cerebellar artery. The basilar artery, the posterior cerebral arteries, the superior cerebellar arteries and the anterior-inferior cerebellar arteries opacify into the capillary and venous phases. Flash filling of the left vertebrobasilar junction from the right vertebral artery injection is noted. The right common carotid arteriogram demonstrates the origin of the right external carotid artery and its major branches to be widely patent. The right internal carotid artery at the bulb has a smooth shallow atherosclerotic plaque without evidence of ulcerations or of intraluminal filling  defects. The vessel is seen to opacify to the cranial skull base with mild FMD- like changes in the mid cervical segment. Distally the petrous, cavernous and supraclinoid segments are widely patent. Flash filling of the right posterior communicating artery and the right posterior cerebral artery is seen. The right middle cerebral artery and the right anterior cerebral artery opacify into the capillary and venous phases. The left common carotid arteriogram demonstrates the left external carotid artery and its major branches to be widely patent. The left internal carotid artery at the bulb and its proximal 1/3 is widely patent. The mid 1/3 segment of the left internal carotid artery demonstrates focal areas of smooth outpouching without intraluminal narrowing. Two focal outpouchings are seen arising at this site. The proximal one measures approximately 1.8 mm, whilst the distal one measures approximately 3.4 mm x 3.4 mm. 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  Distal to this the distal cervical, the petrous cavernous and the supraclinoid left ICA are widely patent. The left middle cerebral artery and the left anterior cerebral artery opacify into the capillary and venous phases. Again noted is flash filling of the left posterior cerebral artery via the left posterior communicating artery. A left subclavian arteriogram demonstrates of the origin of the left vertebral artery with a very fine string sign. There is contrast noted just distal to this. More distally the left vertebral artery is non-opacified. There is suggestion of delayed opacification of the of the left vertebrobasilar junction from muscular collaterals arising from the ascending cervical branch of the of the left thyrocervical trunk. IMPRESSION: Approximately 30% stenosis of the proximal right vertebral artery secondary to a smooth circumferential soft atherosclerotic plaque. 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 See discussion above. PLAN: Findings reviewed with the patient and referring neurologist. Follow-up CT angiogram of the head and neck in 6 months time. Electronically Signed   By: Julieanne Cotton M.D.   On: 05/22/2020 08:34   IR ANGIO VERTEBRAL SEL VERTEBRAL UNI R MOD SED  Result Date: 05/23/2020 CLINICAL DATA:  Posterior fossa ischemic strokes. 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 EXAM: IR ANGIO EXTERNAL CAROTID SEL EXT CAROTID BILAT MOD SED COMPARISON:  MRI MRA of the brain of May 19, 2020. MEDICATIONS: Heparin 2000 units intra-arterially. No antibiotic was administered within 1 hour of the procedure. ANESTHESIA/SEDATION: Versed 1 mg IV; Fentanyl 25 mcg IV Moderate Sedation Time:  69 minutes The patient was continuously monitored during the procedure by the interventional radiology nurse under my direct supervision. CONTRAST:  Isovue 300 approximately 100 mL FLUOROSCOPY TIME:  Fluoroscopy Time: 23 minutes 18 seconds (857 mGy). COMPLICATIONS: None immediate. TECHNIQUE: Informed written consent was obtained from the patient after a thorough discussion of the procedural risks, benefits and alternatives. All questions were addressed. Maximal Sterile Barrier Technique was utilized including caps, mask, sterile gowns, sterile gloves, sterile drape, hand hygiene and skin antiseptic. A timeout was performed prior to the initiation of the procedure. The right forearm to the wrist was prepped and draped in the usual sterile manner. The right radial artery  was then identified with ultrasound, and its morphology documented. A dorsal palmar anastomosis was verified to be present. Using ultrasound guidance, radial access was obtained with a micropuncture set. Over a 0.018 inch micro guidewire, a 4/5 French radial sheath was inserted. The obturator, and the micro guidewire were removed. Good aspiration obtained from the side port of the sheath. A cocktail of 2000 units of heparin, and 200 mcg of nitroglycerin was then infused in diluted form through the sheath without event. A right radial arteriogram was performed. Over a 0.035 inch Roadrunner guidewire, a 5 [place] Simmons 2 diagnostic catheter was advanced to the aortic arch region, and selectively positioned in the right vertebral artery, the right common carotid artery. However, access to the left common carotid artery and the left subclavian artery could not be obtained via the radial approach due to the aortic arch anatomy. A wrist band was then applied  at the right radial puncture site for hemostasis. The right radial pulse was verified to be present. The right common femoral artery approach was then utilized. Over a 0.035 inch guidewire, a 5 French Pinnacle sheath was inserted. Through this and also over a 0.035 inch guidewire, a 5 [place] JB 1 catheter was advanced to the aortic arch region and selectively positioned in the left common carotid artery and the left subclavian artery. FINDINGS: The right vertebral artery origin is widely patent. There is a 30% stenosis of the proximal right vertebral artery secondary to a smooth circumferential plaque. More distally the vessel is seen to opacify to the cranial skull base. Patency is seen of the right vertebrobasilar junction and the right posterior-inferior cerebellar artery. The basilar artery, the posterior cerebral arteries, the superior cerebellar arteries and the anterior-inferior cerebellar arteries opacify into the capillary and venous phases. Flash filling of the left vertebrobasilar junction from the right vertebral artery injection is noted. The right common carotid arteriogram demonstrates the origin of the right external carotid artery and its major branches to be widely patent. The right internal carotid artery at the bulb has a smooth shallow atherosclerotic plaque without evidence of ulcerations or of intraluminal filling defects. The vessel is seen to opacify to the cranial skull base with mild FMD- like changes in the mid cervical segment. Distally the petrous, cavernous and supraclinoid segments are widely patent. Flash filling of the right posterior communicating artery and the right posterior cerebral artery is seen. The right middle cerebral artery and the right anterior cerebral artery opacify into the capillary and venous phases. The left common carotid arteriogram demonstrates the left external carotid artery and its major branches to be widely patent. The left internal carotid artery at the  bulb and its proximal 1/3 is widely patent. The mid 1/3 segment of the left internal carotid artery demonstrates focal areas of smooth outpouching without intraluminal narrowing. Two focal outpouchings are seen arising at this site. The proximal one measures approximately 1.8 mm, whilst the distal one measures approximately 3.4 mm x 3.4 mm. 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 Distal to this the distal cervical, the petrous cavernous and the supraclinoid left ICA are widely patent. The left middle cerebral artery and the left anterior cerebral artery opacify into the capillary and venous phases. Again noted is flash filling of the left posterior cerebral artery via the left posterior communicating artery. A left subclavian arteriogram demonstrates of the origin of the left vertebral artery with a very fine string sign. There is contrast noted just distal to this. More  distally the left vertebral artery is non-opacified. There is suggestion of delayed opacification of the of the left vertebrobasilar junction from muscular collaterals arising from the ascending cervical branch of the of the left thyrocervical trunk. IMPRESSION: Approximately 30% stenosis of the proximal right vertebral artery secondary to a smooth circumferential soft atherosclerotic plaque. 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 See discussion above. PLAN: Findings reviewed with the patient and referring neurologist. Follow-up CT angiogram of the  head and neck in 6 months time. Electronically Signed   By: Julieanne Cotton M.D.   On: 05/22/2020 08:34   IR ANGIO EXTERNAL CAROTID SEL EXT CAROTID BILAT MOD SED  Result Date: 05/23/2020 CLINICAL DATA:  Posterior fossa ischemic strokes. 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 EXAM: IR ANGIO EXTERNAL CAROTID SEL EXT CAROTID BILAT MOD SED COMPARISON:  MRI MRA of the brain of May 19, 2020. MEDICATIONS: Heparin 2000 units intra-arterially. No antibiotic was administered within 1 hour of the procedure. ANESTHESIA/SEDATION: Versed 1 mg IV; Fentanyl 25 mcg IV Moderate Sedation Time:  69 minutes The patient was continuously monitored during the procedure by the interventional radiology nurse under my direct supervision. CONTRAST:  Isovue 300 approximately 100 mL FLUOROSCOPY TIME:  Fluoroscopy Time: 23 minutes 18 seconds (857 mGy). COMPLICATIONS: None immediate. TECHNIQUE: Informed written consent was obtained from the patient after a thorough discussion of the procedural risks, benefits and alternatives. All questions were addressed. Maximal Sterile Barrier Technique was utilized including caps, mask, sterile gowns, sterile gloves, sterile drape, hand hygiene and skin antiseptic. A timeout was performed prior to the initiation of the procedure. The right forearm to the wrist was prepped and draped in the usual sterile manner. The right radial artery was then identified with ultrasound, and its morphology documented. A dorsal palmar anastomosis was verified to be present. Using ultrasound guidance, radial access was obtained with a micropuncture set. Over a 0.018 inch micro guidewire, a 4/5 French radial sheath was inserted. The obturator, and the micro guidewire were removed. Good aspiration obtained from the side port of the sheath. A cocktail of 2000 units of heparin, and 200 mcg of nitroglycerin was then infused in diluted form through the sheath without event. A right radial  arteriogram was performed. Over a 0.035 inch Roadrunner guidewire, a 5 [place] Simmons 2 diagnostic catheter was advanced to the aortic arch region, and selectively positioned in the right vertebral artery, the right common carotid artery. However, access to the left common carotid artery and the left subclavian artery could not be obtained via the radial approach due to the aortic arch anatomy. A wrist band was then applied at the right radial puncture site for hemostasis. The right radial pulse was verified to be present. The right common femoral artery approach was then utilized. Over a 0.035 inch guidewire, a 5 French Pinnacle sheath was inserted. Through this and also over a 0.035 inch guidewire, a 5 [place] JB 1 catheter was advanced to the aortic arch region and selectively positioned in the left common carotid artery and the left subclavian artery. FINDINGS: The right vertebral  artery origin is widely patent. There is a 30% stenosis of the proximal right vertebral artery secondary to a smooth circumferential plaque. More distally the vessel is seen to opacify to the cranial skull base. Patency is seen of the right vertebrobasilar junction and the right posterior-inferior cerebellar artery. The basilar artery, the posterior cerebral arteries, the superior cerebellar arteries and the anterior-inferior cerebellar arteries opacify into the capillary and venous phases. Flash filling of the left vertebrobasilar junction from the right vertebral artery injection is noted. The right common carotid arteriogram demonstrates the origin of the right external carotid artery and its major branches to be widely patent. The right internal carotid artery at the bulb has a smooth shallow atherosclerotic plaque without evidence of ulcerations or of intraluminal filling defects. The vessel is seen to opacify to the cranial skull base with mild FMD- like changes in the mid cervical segment. Distally the petrous, cavernous and  supraclinoid segments are widely patent. Flash filling of the right posterior communicating artery and the right posterior cerebral artery is seen. The right middle cerebral artery and the right anterior cerebral artery opacify into the capillary and venous phases. The left common carotid arteriogram demonstrates the left external carotid artery and its major branches to be widely patent. The left internal carotid artery at the bulb and its proximal 1/3 is widely patent. The mid 1/3 segment of the left internal carotid artery demonstrates focal areas of smooth outpouching without intraluminal narrowing. Two focal outpouchings are seen arising at this site. The proximal one measures approximately 1.8 mm, whilst the distal one measures approximately 3.4 mm x 3.4 mm. 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 Distal to this the distal cervical, the petrous cavernous and the supraclinoid left ICA are widely patent. The left middle cerebral artery and the left anterior cerebral artery opacify into the capillary and venous phases. Again noted is flash filling of the left posterior cerebral artery via the left posterior communicating artery. A left subclavian arteriogram demonstrates of the origin of the left vertebral artery with a very fine string sign. There is contrast noted just distal to this. More distally the left vertebral artery is non-opacified. There is suggestion of delayed opacification of the of the left vertebrobasilar junction from muscular collaterals arising from the ascending cervical branch of the of the left thyrocervical trunk. IMPRESSION: Approximately 30% stenosis of the proximal right vertebral artery secondary to a smooth circumferential soft atherosclerotic plaque. 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 See discussion above. PLAN: Findings reviewed with the patient and referring neurologist. Follow-up CT angiogram of the head and neck in 6 months time. Electronically Signed   By: Julieanne Cotton M.D.   On: 05/22/2020 08:34        The results of significant diagnostics from this hospitalization (including imaging, microbiology, ancillary and laboratory) are listed below for reference.     Microbiology: Recent Results (from the past 240 hour(s))  Resp Panel by RT-PCR (Flu A&B, Covid) Nasopharyngeal Swab     Status: None   Collection Time: 05/18/20 10:08 PM   Specimen: Nasopharyngeal Swab; Nasopharyngeal(NP) swabs  in vial transport medium  Result Value Ref Range Status   SARS Coronavirus 2 by RT PCR NEGATIVE NEGATIVE Final    Comment: (NOTE) SARS-CoV-2 target nucleic acids are NOT DETECTED.  The SARS-CoV-2 RNA is generally detectable in upper respiratory specimens during the acute phase of infection. The lowest concentration of SARS-CoV-2 viral copies this assay can detect is 138 copies/mL. A negative result does not preclude SARS-Cov-2 infection and should not be used as the sole basis for treatment or other patient management decisions. A negative result may occur with  improper specimen collection/handling, submission of specimen other than nasopharyngeal swab, presence of viral mutation(s) within the areas targeted by this assay, and inadequate number of viral copies(<138 copies/mL). A negative result must be combined with clinical observations, patient history, and epidemiological information. The expected result is Negative.  Fact Sheet for Patients:   BloggerCourse.com  Fact Sheet for Healthcare Providers:  SeriousBroker.it  This test is no t yet approved or cleared by the [place] FDA and  has been authorized for detection and/or diagnosis of SARS-CoV-2 by FDA under an Emergency Use Authorization (EUA). This EUA will remain  in effect (meaning this test can be used) for the duration of the COVID-19 declaration under Section 564(b)(1) of the Act, 21 U.S.C.section 360bbb-3(b)(1), unless the authorization is terminated  or revoked sooner.       Influenza A by PCR NEGATIVE NEGATIVE Final   Influenza B by PCR NEGATIVE NEGATIVE Final    Comment: (NOTE) The Xpert Xpress SARS-CoV-2/FLU/RSV plus assay is intended as an aid in the diagnosis of influenza from Nasopharyngeal swab specimens and should not be used as a sole basis for treatment. Nasal washings and aspirates are unacceptable for Xpert Xpress SARS-CoV-2/FLU/RSV testing.  Fact Sheet for Patients: BloggerCourse.com  Fact Sheet for Healthcare Providers: SeriousBroker.it  This test is not yet approved or cleared by the [place] FDA and has been authorized for detection and/or diagnosis of SARS-CoV-2 by FDA under an Emergency Use Authorization (EUA). This EUA will remain in effect (meaning this test can be used) for the duration of the COVID-19 declaration under Section 564(b)(1) of the Act, 21 U.S.C. section 360bbb-3(b)(1), unless the authorization is terminated or revoked.  Performed at [facility], [address], [place], [place] [address]   MRSA PCR Screening     Status: None   Collection Time: 05/19/20  6:50 AM   Specimen: Nasopharyngeal  Result Value Ref Range Status   MRSA by PCR NEGATIVE NEGATIVE Final    Comment:        The GeneXpert MRSA Assay (FDA approved for NASAL specimens only), is one component of a comprehensive MRSA  colonization surveillance program. It is not intended to diagnose MRSA infection nor to guide or monitor treatment for MRSA infections. Performed at [facility], [address] [place], [place] [address]      Labs:  CBC: Recent Labs  Lab 05/18/20 2119 05/19/20 0349 05/20/20 0330 05/23/20 0313  WBC 11.6* 10.8* 6.7 7.1  NEUTROABS 9.6*  --   --   --   HGB 12.7 12.4 12.7 11.3*  HCT 38.6 36.4 37.5 33.1*  MCV 90.2 88.1 89.9 88.5  PLT 206 215 196 177   BMP &GFR Recent Labs  Lab 05/18/20 2119 05/20/20 0330 05/23/20 0313  NA 137 139 138  K 2.7* 4.4 3.7  CL 101 105 103  CO2 25 27 26   GLUCOSE 143* 87 114*  BUN 12 13 12  CREATININE 0.65 0.79 0.81  CALCIUM 9.2 9.3 9.2  MG 1.8  --   --    Estimated Creatinine Clearance: 57 mL/min (by C-G formula based on SCr of 0.81 mg/dL). Liver & Pancreas: No results for input(s): AST, ALT, ALKPHOS, BILITOT, PROT, ALBUMIN in the last 168 hours. No results for input(s): LIPASE, AMYLASE in the last 168 hours. No results for input(s): AMMONIA in the last 168 hours. Diabetic: No results for input(s): HGBA1C in the last 72 hours. Recent Labs  Lab 05/19/20 0704  GLUCAP 84   Cardiac Enzymes: No results for input(s): CKTOTAL, CKMB, CKMBINDEX, TROPONINI in the last 168 hours. No results for input(s): PROBNP in the last 8760 hours. Coagulation Profile: Recent Labs  Lab 05/21/20 0752  INR 1.1   Thyroid Function Tests: No results for input(s): TSH, T4TOTAL, FREET4, T3FREE, THYROIDAB in the last 72 hours. Lipid Profile: No results for input(s): CHOL, HDL, LDLCALC, TRIG, CHOLHDL, LDLDIRECT in the last 72 hours. Anemia Panel: No results for input(s): VITAMINB12, FOLATE, FERRITIN, TIBC, IRON, RETICCTPCT in the last 72 hours. Urine analysis:    Component Value Date/Time   COLORURINE YELLOW 05/19/2020 0340   APPEARANCEUR CLEAR 05/19/2020 0340   LABSPEC 1.019 05/19/2020 0340   PHURINE 7.0 05/19/2020 0340   GLUCOSEU NEGATIVE  05/19/2020 0340   HGBUR NEGATIVE 05/19/2020 0340   BILIRUBINUR NEGATIVE 05/19/2020 0340   KETONESUR 20 (A) 05/19/2020 0340   PROTEINUR NEGATIVE 05/19/2020 0340   NITRITE NEGATIVE 05/19/2020 0340   LEUKOCYTESUR NEGATIVE 05/19/2020 0340   Sepsis Labs: Invalid input(s): PROCALCITONIN, LACTICIDVEN   Time coordinating discharge: 35 minutes  SIGNED:  Almon Hercules, MD  [facility] 05/23/2020, 12:30 PM  If 7PM-7AM, please contact night-coverage [email or web address]

## 2020-05-23 NOTE — H&P (Signed)
Physical Medicine and Rehabilitation Admission H&P     HPI: Jeanette Barnes is a 65 year old right-handed female with history of hypertension, hyperlipidemia and mitral valve prolapse.  History taken from chart review and patient.  Patient lives alone.  Reportedly independent prior to admission and active.  1 level townhouse with 2 steps to entry.  She has a daughter in the area.  She presented on 05/18/2020 with dizziness, lethargy, and unstable gait of acute onset.  Patient got up to go to the bathroom had a vomiting episode fell and struck the left side of her head and neck on the trash bin.  Denies LOC. MRI/MRI showed patchy acute ischemic nonhemorrhagic infarct involving the left cerebellum, cerebellar vermis, right temporal occipital region and left midbrain pons.  MRA of the neck showed occlusion of the left vertebral artery with concern for acute dissection.  MRA of the head partial occlusion of the left vertebral artery at its origin.  Cerebral arteriogram showed occluded left VA at its origin without distal reconstitution.  30% stenosis of proximal right VA and no dissection identified.  Echocardiogram with ejection fraction 60 to 65%, no wall motion abnormalities.  Her admission chemistries were unremarkable except potassium 2.7, glucose 143, troponin negative, urine drug screen negative.  Currently maintained on aspirin and Plavix for CVA prophylaxis x3 months then aspirin alone.  Recommendations for 30-day outpatient cardiac event monitor.  She is tolerating a regular consistency diet.  Therapy evaluations completed and due to patient's dizziness, gait instability patient was admitted for a comprehensive rehab program.  Please see preadmission assessment from earlier today as well.  Review of Systems  Constitutional: Negative for chills and fever.  HENT: Negative for hearing loss.   Eyes: Negative for blurred vision and double vision.  Respiratory: Negative for cough and shortness of  breath.   Cardiovascular: Negative for chest pain, palpitations and leg swelling.  Gastrointestinal: Positive for constipation. Negative for heartburn.  Genitourinary: Negative for dysuria, flank pain and hematuria.  Musculoskeletal: Positive for falls.  Skin: Negative for rash.  Neurological: Positive for dizziness and focal weakness.  Psychiatric/Behavioral:       Anxiety  All other systems reviewed and are negative.  Past Medical History:  Diagnosis Date  . AAT (alpha-1-antitrypsin) deficiency (HCC)   . Anxiety   . Heart murmur    mitral valve prolapse  . Hypercholesteremia   . Hypertension   . Pulmonary nodule   . Vitamin D deficiency    Past Surgical History:  Procedure Laterality Date  . ABDOMINAL HYSTERECTOMY    . BREAST BIOPSY    . BREAST EXCISIONAL BIOPSY Bilateral 1990's   2 Left, 1 Right, all benign   Family History  Problem Relation Age of Onset  . Emphysema Father   . Hypertension Brother   . Kidney disease Brother    Social History:  reports that she has never smoked. She has never used smokeless tobacco. She reports that she does not drink alcohol and does not use drugs. Allergies: No Known Allergies Medications Prior to Admission  Medication Sig Dispense Refill  . acetaminophen (TYLENOL) 500 MG tablet Take 1,000 mg by mouth every 6 (six) hours as needed for mild pain or headache.    . albuterol (VENTOLIN HFA) 108 (90 Base) MCG/ACT inhaler Inhale 2 puffs into the lungs every 6 (six) hours as needed for wheezing or shortness of breath. 18 g 6  . Biotin 1 MG CAPS Take 1 mg by mouth daily.    Marland Kitchen  cholecalciferol (VITAMIN D) 400 units TABS tablet Take 400 Units by mouth.    . hydrochlorothiazide (HYDRODIURIL) 25 MG tablet Take 25 mg by mouth daily.    . naproxen sodium (ALEVE) 220 MG tablet Take 220 mg by mouth daily as needed (pain/headace).    Marland Kitchen OVER THE COUNTER MEDICATION Take 1 tablet by mouth daily. Stress relief gummy---olly brand    . vitamin B-12  (CYANOCOBALAMIN) 1000 MCG tablet Take 1,000 mcg by mouth daily.    Marland Kitchen VITAMIN E PO Take 1 tablet by mouth as needed.      Drug Regimen Review Drug regimen was reviewed and remains appropriate with no significant issues identified  Home: Home Living Family/patient expects to be discharged to:: Private residence Living Arrangements: Alone Available Help at Discharge: Available PRN/intermittently Type of Home: Other(Comment) (townhouse) Home Access: Stairs to enter [employer] of Steps: 2 Entrance Stairs-Rails: None Home Layout: One level Bathroom Shower/Tub: Tub/shower unit,Curtain Firefighter: Standard Bathroom Accessibility: Yes Home Equipment: None  Lives With: Alone   Functional History: Prior Function Level of Independence: Independent Comments: driving, 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  Pt needs RW for stability at present time. Ambulation/Gait Ambulation/Gait assistance: +2 safety/equipment,Min assist,Mod assist Gait Distance (Feet): 200 Feet Assistive device: Rolling walker (2 wheeled),None Gait Pattern/deviations: Step-through pattern,Decreased stride length,Staggering right,Staggering left General Gait Details: Pt was able to walk on unit with min assist and RW and chair follow for safety.  discussed compensation by decr head  turns as this incr vertigo as pt with suspected vestibular hypofunction.  Pt needed mod assist without device as pt does get unsteady without UE support.  HR from 102-120 bpm with other VSS. Pt experienced imbalance when turning head in both directions. Also noted that if pt looked around she was off balance with RW in use.  Trial of walk without RW and pt mod assist with challenges to balance. Gait velocity interpretation: <1.31 ft/sec, indicative of household ambulator    ADL: ADL Overall ADL's : Needs assistance/impaired Eating/Feeding: Set up,Sitting Eating/Feeding Details (indicate cue type and reason): recliner, with increased time Grooming: Wash/dry hands,Oral care,Minimal assistance,Standing Grooming Details (indicate cue type and reason): Pt stood at sink to groom. pt required assist with balance wtih one LOB to the left with min assist to recover. Pt reports feeling dizzy when she looks down. Upper Body Bathing: Minimal assistance,Sitting Upper Body Bathing Details (indicate cue type and reason): recliner, with increased time Lower Body Bathing: Maximal assistance Lower Body Bathing Details (indicate cue type and reason): Mod A +2 sit<>stand Upper Body Dressing : Minimal assistance,Sitting Upper Body Dressing Details (indicate cue type and reason): pt donned gown at EOB with min assist to tie gown. Lower Body Dressing: Minimal assistance,Sit to/from stand,Cueing for compensatory techniques Lower Body Dressing Details (indicate cue type and reason): Pt required min assist to come to standing and required min assist to maintain balance in standing while managng clothing. Toilet Transfer: Moderate assistance,Ambulation  Toilet Transfer Details (indicate cue type and reason): Pt walked to bathroom with mod assist (min assist execept one LOB requiring mod assist to recover). Toileting- Clothing Manipulation and Hygiene: Min guard,Sitting/lateral lean Toileting - Clothing Manipulation  Details (indicate cue type and reason): +1 assist to stand to clean self.  Otherwise cleaned self in sitting with min guard. Functional mobility during ADLs: Minimal assistance General ADL Comments: Pt doing better wtih adls this am but continues to require min assist due to decreased balance, L side incoordination and mild impulsivity.  Cognition: Cognition Overall Cognitive Status: Within Functional Limits for tasks assessed Arousal/Alertness: Awake/alert Orientation Level: Oriented X4 Attention: Sustained Sustained Attention: Impaired Sustained Attention Impairment: Verbal basic Memory: Appears intact Awareness: Appears intact Cognition Arousal/Alertness: Awake/alert Behavior During Therapy: WFL for tasks assessed/performed Overall Cognitive Status: Within Functional Limits for tasks assessed Area of Impairment: Safety/judgement,Awareness,Problem solving Safety/Judgement: Decreased awareness of safety,Decreased awareness of deficits Awareness: Emergent Problem Solving: Requires verbal cues,Requires tactile cues General Comments: Pt(compared to notes from yesterday) appears to answer questions more quickly and more sure of answeres.  Physical Exam: Blood pressure 136/69, pulse 72, temperature 98.2 F (36.8 C), temperature source Oral, resp. rate 18, weight 62.1 kg, SpO2 98 %. Physical Exam Vitals reviewed.  Constitutional:      General: She is not in acute distress.    Appearance: She is normal weight.  HENT:     Head: Normocephalic and atraumatic.     Right Ear: External ear normal.     Left Ear: External ear normal.     Nose: Nose normal.  Eyes:     General:        Right eye: No discharge.        Left eye: No discharge.     Extraocular Movements: Extraocular movements intact.  Cardiovascular:     Rate and Rhythm: Normal rate and regular rhythm.  Pulmonary:     Effort: Pulmonary effort is normal. No respiratory distress.     Breath sounds: No stridor.  Abdominal:      General: Abdomen is flat. There is distension.     Comments: Hypoactive bowel sounds  Musculoskeletal:     Cervical back: Normal range of motion and neck supple.     Comments: No edema or tenderness in extremities  Skin:    General: Skin is warm and dry.  Neurological:     Mental Status: She is alert.     Comments: Alert and oriented Makes eye contact with examiner as entering room.  Follows simple commands.   Motor: Bilateral upper extremities: 5/5 proximal distal Right lower extremity: 5/5 proximal distal Left lower extremity: 4+-5/5 proximal to distal Sensation intact light touch No ataxia bilateral upper extremities  Psychiatric:        Mood and Affect: Mood normal.        Behavior: Behavior normal.        Thought Content: Thought content normal.     Results for orders placed or performed during the hospital encounter of 05/18/20 (from the past 48 hour(s))  Protime-INR     Status: None   Collection Time: 05/21/20  7:52 AM  Result Value Ref Range   Prothrombin Time 13.3 11.4 - 15.2 seconds   INR 1.1 0.8 - 1.2    Comment: (NOTE) INR goal varies based on device and disease states. Performed at [facility], [address], [place], [place] [address]   Basic metabolic panel     Status: Abnormal  Collection Time: 05/23/20  3:13 AM  Result Value Ref Range   Sodium 138 135 - 145 mmol/L   Potassium 3.7 3.5 - 5.1 mmol/L   Chloride 103 98 - 111 mmol/L   CO2 26 22 - 32 mmol/L   Glucose, Bld 114 (H) 70 - 99 mg/dL    Comment: Glucose reference range applies only to samples taken after fasting for at least 8 hours.   BUN 12 8 - 23 mg/dL   Creatinine, Ser 1.61 0.44 - 1.00 mg/dL   Calcium 9.2 8.9 - 09.6 mg/dL   GFR, Estimated >04 >54 mL/min    Comment: (NOTE) Calculated using the CKD-EPI Creatinine Equation (2021)    Anion gap 9 5 - 15    Comment: Performed at [facility], [address] [place], [place] [address]  CBC     Status: Abnormal   Collection Time:  05/23/20  3:13 AM  Result Value Ref Range   WBC 7.1 4.0 - 10.5 K/uL   RBC 3.74 (L) 3.87 - 5.11 MIL/uL   Hemoglobin 11.3 (L) 12.0 - 15.0 g/dL   HCT 91.4 (L) 78.2 - 95.6 %   MCV 88.5 80.0 - 100.0 fL   MCH 30.2 26.0 - 34.0 pg   MCHC 34.1 30.0 - 36.0 g/dL   RDW 21.3 08.6 - 57.8 %   Platelets 177 150 - 400 K/uL   nRBC 0.0 0.0 - 0.2 %    Comment: Performed at [facility], [address], [place], [place] [address]   No results found.     Medical Problem List and Plan: 1.  Dizziness with gait instability secondary to left cerebellum, cerebellar vermis, right temporal parietal occipital and left midbrain pons infarct likely due to occlusion of left vertebral artery.  Plan 30-day cardiac event monitor as outpatient  -patient may shower  -ELOS/Goals: 8-12 days/supervision/mod I  Admit to CIR 2.  Antithrombotics: -DVT/anticoagulation: SCDs  -antiplatelet therapy: Aspirin 81 mg daily and Plavix 75 mg day x3 months and aspirin alone 3. Pain Management: Tylenol as needed 4. Mood: Provide emotional support  -antipsychotic agents: N/A 5. Neuropsych: This patient is capable of making decisions on her own behalf. 6. Skin/Wound Care: Routine skin checks 7. Fluids/Electrolytes/Nutrition: Routine in and outs  CMP ordered 8.  Hypertension.  HCTZ 25 mg daily  Monitor with increased mobility  9.  Hyperlipidemia: Lipitor 10. History of mitral valve prolapse.  Follow-up outpatient 11.  Acute blood loss anemia  Hemoglobin 11.3 on 3/11  CBC ordered  Charlton Amor, PA-C 05/23/2020  I have personally performed a face to face diagnostic evaluation, including, but not limited to relevant history and physical exam findings, of this patient and developed relevant assessment and plan.  Additionally, I have reviewed and concur with the physician assistant's documentation above.  Maryla Morrow, MD, ABPMR

## 2020-05-23 NOTE — Progress Notes (Signed)
Patient arrived from [place], assigned to 4W05. Appeqrs alert with no complaints of pain.

## 2020-05-23 NOTE — Progress Notes (Signed)
Inpatient Rehabilitation Admissions Coordinator  I have insurance approval and CIR bed available to admit patient to today. I met with patient at bedside and she is in agreement. I have alerted Dr. Cyndia Skeeters, acute team and TOC. I will make the arrangements to admit today.  Danne Baxter, RN, MSN Rehab Admissions Coordinator [phone] 05/23/2020 12:08 PM

## 2020-05-23 NOTE — Progress Notes (Signed)
Marcello Fennel, MD  Physician  Physical Medicine and Rehabilitation  PMR Pre-admission     Addendum  Date of Service:  05/22/2020 12:53 PM      Related encounter: ED to Hosp-Admission (Current) from 05/18/2020 in [facility]           Show:Clear all [x] Manual[x] Template[x] Copied  Added by: [x]  , RN[x] Beckie Salts, MD   [] Hover for details  PMR Admission Coordinator Pre-Admission Assessment   Patient: Jeanette Barnes is an 65 y.o., female MRN: DOB: Jul 24, 1955 Height:   Weight: 62.1 kg                                                                                                                                                  Insurance Information HMO:     PPO:      PCP:      IPA:      80/20:      OTHER:  PRIMARY: [employer]      Policy#: [id number]      Subscriber: pt CM Name: [id number]      Phone#: [phone] ext [phone]     Fax#: Elease Hashimoto Pre-Cert#: [id number] approved for 7 days     Employer:  Benefits:  Phone #: [phone]     Name: 3/10 Eff. Date: 03/15/2020     Deduct: none      Out of Pocket Max: $3900      Life Max: none  CIR: $295 co pay per day days 1 until 6      SNF: 100% Outpatient: $20 per visit     Co-Pay: visits per medical neccesity Home Health: 100%      Co-Pay: visits per medical neccesity DME: 80%     Co-Pay: 20% Providers: in network  SECONDARY: none   Financial Counselor:       Phone#:    The [phone] for patients in Inpatient Rehabilitation Facilities with attached Privacy Act Statement-Health Care Records was provided and verbally reviewed with: Patient   Emergency Contact Information Contact Information       Name Relation Home Work Mobile    [place] Daughter [phone]             Current Medical History  Patient Admitting Diagnosis: CVA   History of Present Illness:   65 y.o. right-handed female with history of hypertension,  hyperlipidemia, mitral valve prolapse.  Presented 05/18/2020 with lethargy, dizziness and unstable gait.  Patient got up to go to the bathroom had a vomiting episode fell to her left hit her neck on the trash bin.  Denied loss of consciousness.  MRI/MRA showed patchy acute ischemic nonhemorrhagic infarct involving the left cerebellum, cerebellar vermis, right temporal occipital region and left midbrain pons.  MRA of the neck occlusion of the left vertebral artery with concern for  acute dissection.  MRA of the head partial occlusion of the left vertebral artery its origin. Started on ASA and Plavix and admitted to ICU for close monitoring.   Diagnostic cerebral angiogram revealed occluded right vertebral artery, likely chronic. 30 % stenosis of the origin of the right vertebral artery. Neurology recommended dual antiplatelet and high dose statin for 3 months followed by ASA only and statin. Needs follow up interventional radiology in 6 months with a CTA of head and neck for follow up on the stenotic right vertebral artery origin. 30 day outpatient cardiac monitoring to evaluate for underlying atrial fibrillation.  Home meds for hypertension with HCTZ. Increase atorvastatin to 80 mg.       Complete NIHSS TOTAL: 0 Glasgow Coma Scale Score: (!) 20   Past Medical History      Past Medical History:  Diagnosis Date   AAT (alpha-1-antitrypsin) deficiency (HCC)     Anxiety     Heart murmur      mitral valve prolapse   Hypercholesteremia     Hypertension     Pulmonary nodule     Vitamin D deficiency        Family History  family history includes Emphysema in her father; Hypertension in her brother; Kidney disease in her brother.   Prior Rehab/Hospitalizations:  Has the patient had prior rehab or hospitalizations prior to admission? Yes   Has the patient had major surgery during 100 days prior to admission? Yes   Current Medications    Current Facility-Administered Medications:    0.9 %   sodium chloride infusion, , Intravenous, PRN, Coralyn HellingSood, [name], MD   acetaminophen (TYLENOL) tablet 650 mg, 650 mg, Oral, Q4H PRN, 650 mg at 05/23/20 1038 **OR** [DISCONTINUED] acetaminophen (TYLENOL) 160 MG/5ML solution 650 mg, 650 mg, Per Tube, Q4H PRN **OR** [DISCONTINUED] acetaminophen (TYLENOL) suppository 650 mg, 650 mg, Rectal, Q4H PRN, John Giovanniathore, Vasundhra, MD   aspirin EC tablet 81 mg, 81 mg, Oral, Daily, Rica Moteollins, Hunter J, MD, 81 mg at 05/23/20 1039   atorvastatin (LIPITOR) tablet 80 mg, 80 mg, Oral, Daily, Milon DikesArora, Ashish, MD, 80 mg at 05/23/20 1039   Chlorhexidine Gluconate Cloth 2 % PADS 6 each, 6 each, Topical, Daily, [name], [name], MD, 6 each at 05/21/20 2220   cholecalciferol (VITAMIN D3) tablet 400 Units, 400 Units, Oral, Daily, John Giovanniathore, Vasundhra, MD, 400 Units at 05/23/20 1039   clopidogrel (PLAVIX) tablet 75 mg, 75 mg, Oral, Daily, Rica Moteollins, Hunter J, MD, 75 mg at 05/23/20 1039   docusate sodium (COLACE) capsule 100 mg, 100 mg, Oral, BID PRN, [name], [name], MD   hydrochlorothiazide (HYDRODIURIL) tablet 25 mg, 25 mg, Oral, Daily, [name], [name], MD, 25 mg at 05/23/20 1039   MEDLINE mouth rinse, 15 mL, Mouth Rinse, BID, [name], [name], MD, 15 mL at 05/23/20 1040   ondansetron (ZOFRAN) injection 4 mg, 4 mg, Intravenous, Q6H PRN, John Giovanniathore, Vasundhra, MD   pantoprazole (PROTONIX) EC tablet 40 mg, 40 mg, Oral, Daily, [name], [name], MD, 40 mg at 05/23/20 1039   polyethylene glycol (MIRALAX / GLYCOLAX) packet 17 g, 17 g, Oral, Daily PRN, [name], [name], MD   vitamin B-12 (CYANOCOBALAMIN) tablet 1,000 mcg, 1,000 mcg, Oral, Daily, John Giovanniathore, Vasundhra, MD, 1,000 mcg at 05/23/20 1040   Patients Current Diet:  Diet Order                  Diet regular Room service appropriate? Yes; Fluid consistency: Thin  Diet effective now  Precautions / Restrictions Precautions Precautions: Fall Precaution Comments: Pt with dizziness when moving head to  left and when looking down. 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  Independent   Indoor Mobility: Did the patient need assistance with walking from room to room (with or without device)? Independent   Stairs: Did the patient need assistance with internal or external stairs (with or without device)? Independent   Functional Cognition: Did the patient need help planning regular tasks such as shopping or remembering to take medications? Independent   Home Assistive Devices / Equipment Home Equipment: None   Prior Device Use: Indicate devices/aids used by the patient prior to current illness, exacerbation or injury? None of the above   Current Functional Level Cognition   Arousal/Alertness: Awake/alert Overall Cognitive Status: Within Functional Limits for tasks assessed Orientation Level: Oriented X4 Safety/Judgement: Decreased awareness of safety,Decreased awareness of deficits General Comments: Pt(compared to notes from yesterday) appears to answer questions more quickly and more sure of answeres. Attention: Sustained Sustained Attention: Impaired Sustained Attention Impairment: Verbal basic Memory: Appears intact Awareness: Appears intact    Extremity Assessment (includes Sensation/Coordination)   Upper Extremity Assessment: LUE deficits/detail LUE Deficits / Details: FNF continues to be slightly uncoordinated and quick RAM slightly impaired on the Left. LUE Sensation: WNL LUE Coordination: decreased fine motor  Lower Extremity  Assessment: Defer to PT evaluation LLE Deficits / Details: mild decreased coordination with heel to shin LLE Sensation: WNL LLE Coordination: decreased gross motor     ADLs   Overall ADL's : Needs assistance/impaired Eating/Feeding: Set up,Sitting Eating/Feeding Details (indicate cue type and reason): recliner, with increased time Grooming: Wash/dry hands,Oral care,Minimal assistance,Standing Grooming Details (indicate cue type and reason): Pt stood at sink to groom. pt required assist with balance wtih one LOB to the left with min assist to recover. Pt reports feeling dizzy when she looks down. Upper Body Bathing: Minimal assistance,Sitting Upper Body Bathing Details (indicate cue type and reason): recliner, with increased time Lower Body Bathing: Maximal assistance Lower Body Bathing Details (indicate cue type and reason): Mod A +2 sit<>stand Upper Body Dressing : Minimal assistance,Sitting Upper Body Dressing Details (indicate cue type and reason): pt donned gown at EOB with min assist to tie gown. Lower Body Dressing: Minimal assistance,Sit to/from stand,Cueing for compensatory techniques Lower Body Dressing Details (indicate cue type and reason): Pt required min assist to come to standing and required min assist to maintain balance in standing while managng clothing. Toilet Transfer: Moderate assistance,Ambulation Statistician Details (indicate cue type and reason): Pt walked to bathroom with mod assist (min assist execept one LOB requiring mod assist to recover). Toileting- Clothing Manipulation and Hygiene: Min guard,Sitting/lateral lean Toileting - Clothing Manipulation Details (indicate cue type and reason): +1 assist to stand to clean self.  Otherwise cleaned self in sitting with min guard. Functional mobility during ADLs: Minimal assistance General ADL Comments: Pt doing better wtih adls this am but continues to require min assist due to decreased balance, L side incoordination  and mild impulsivity.     Mobility  Overal bed mobility: Needs Assistance Bed Mobility: Supine to Sit,Sit to Supine Rolling: Supervision Sidelying to sit: Supervision Supine to sit: Supervision Sit to supine: Min assist General bed mobility comments: No assist to sit EOB     Transfers   Overall transfer level: Needs assistance Equipment used: 1 person hand held assist,Rolling walker (2 wheeled) Transfers: Sit to/from [employer] Sit to Stand: Min assist Stand pivot transfers: Min assist General transfer comment: Pt needs steadying assist as she gets dizzy especially with head turns.  Pt needs RW for stability at present time.     Ambulation / Gait / Stairs / Wheelchair Mobility   Ambulation/Gait Ambulation/Gait assistance: +2 safety/equipment,Min assist,Mod assist Gait Distance (Feet): 200 Feet Assistive device: Rolling walker (2 wheeled),None Gait Pattern/deviations: Step-through pattern,Decreased stride length,Staggering right,Staggering left General Gait Details: Pt was able to walk on unit with min assist and RW and chair follow for safety.  discussed compensation by decr head turns as this incr vertigo as pt with suspected vestibular hypofunction.  Pt needed mod assist without device as pt does get unsteady without UE support.  HR from 102-120 bpm with other VSS. Pt experienced imbalance when turning head in both directions. Also noted that if pt looked around she was off balance with RW in use.  Trial of walk without RW and pt mod assist with challenges to balance. Gait velocity interpretation: <1.31 ft/sec, indicative of household ambulator     Posture / Balance Dynamic Sitting Balance Sitting balance - Comments: No lean today Balance Overall balance assessment: Needs assistance Sitting-balance support: Feet supported Sitting balance-Leahy Scale: Good Sitting balance - Comments: No lean today Standing balance support: Bilateral upper extremity  supported Standing balance-Leahy Scale: Poor Standing balance comment: Pt reliant on outside support to remain in standing due to dizziness when looking down or when turning head to the Left and right.  Pt takes small short steps when she becomes dizzy making pt a fall risk.     Special needs/care consideration      Previous Home Environment  Living Arrangements: Alone  Lives With: Alone Available Help at Discharge: Available PRN/intermittently Type of Home: Other(Comment) (townhouse) Home Layout: One level Home Access: Stairs to enter Entrance Stairs-Rails: None Entrance Stairs-Number of Steps: 2 Bathroom Shower/Tub: Teacher, early years/pre: Standard Bathroom Accessibility: Yes How Accessible: Accessible via walker Home Care Services: No   Discharge Living Setting Plans for Discharge Living Setting: Patient's home,Other (Comment) (townhouse) Type of Home at Discharge: Other (Comment) (townhouse) Discharge Home Layout: One level Discharge Home Access: Stairs to enter Entrance Stairs-Rails: None Entrance Stairs-Number of Steps: 2 Discharge Bathroom Shower/Tub: Tub/shower unit Discharge Bathroom Toilet: Standard Discharge Bathroom Accessibility: Yes How Accessible: Accessible via walker Does the patient have any problems obtaining your medications?: No   Social/Family/Support Systems Contact Information: Daughter, Jeanette Barnes: daughter, intermittent Anticipated Barnes's Contact Information: see above Barnes Availability: Intermittent Discharge Plan Discussed with Primary Barnes: Yes Is Barnes In Agreement with Plan?: Yes Does Barnes/Family have Issues with Lodging/Transportation while Pt is in Rehab?: No   Goals Patient/Family Goal for Rehab: Mod I with PT and OT Expected length of stay: ELOS 8-12 days Pt/Family Agrees to Admission and willing to participate: Yes Program Orientation Provided & Reviewed with Pt/Barnes  Including Roles  & Responsibilities: Yes   Decrease burden of Care through IP rehab admission: n/a   Possible need for SNF placement upon discharge:not anticipated   Patient Condition: This patient's medical and functional status has changed since  the consult dated: 05/19/2020 in which the Rehabilitation Physician determined and documented that the patient's condition is appropriate for intensive rehabilitative care in an inpatient rehabilitation facility. See "History of Present Illness" (above) for medical update. Functional changes are: min assist overall. Patient's medical and functional status update has been discussed with the Rehabilitation physician and patient remains appropriate for inpatient rehabilitation. Will admit to inpatient rehab today.   Preadmission Screen Completed By:  Clois Dupes, RN, 05/23/2020 11:51 AM ______________________________________________________________________   Discussed status with Dr. Allena Katz on 05/23/2020 at  1153 and received approval for admission today.   Admission Coordinator:  Clois Dupes, time 7510 Date 05/23/2020           Revision History                                  Note Details  Author Allena Katz, Maryln Gottron, MD File Time 05/23/2020 11:56 AM  Author Type Physician Status Addendum  Last Editor Marcello Fennel, MD Service Physical Medicine and Rehabilitation

## 2020-05-23 NOTE — Progress Notes (Signed)
Horton Chin, MD  Physician  Physical Medicine and Rehabilitation  Consult Note     Signed  Date of Service:  05/19/2020  2:23 PM      Related encounter: ED to Hosp-Admission (Current) from 05/18/2020 in [place] [facility]       Signed      Expand All Collapse All     Show:Clear all [x] Manual[x] Template[] Copied  Added by: [x] Angiulli, Mcarthur Rossetti, PA-C[x] Raulkar, Drema Pry, MD   [] Hover for details           Physical Medicine and Rehabilitation Consult Reason for Consult: Gait instability Referring Physician: Dr. Craige Cotta     HPI: Jeanette Barnes is a 65 y.o. right-handed female with history of hypertension, hyperlipidemia, mitral valve prolapse.  Per chart review patient lives alone.  Reportedly independent prior to admission and active.  1 level townhouse with 2 steps to enter.  She has a daughter in the area.  Presented 05/18/2020 with lethargy, dizziness and unstable gait.  Patient got up to go to the bathroom had a vomiting episode fell to her left hit her neck on the trash bin.  Denied loss of consciousness.  MRI/MRA showed patchy acute ischemic nonhemorrhagic infarct involving the left cerebellum, cerebellar vermis, right temporal occipital region and left midbrain pons.  MRA of the neck occlusion of the left vertebral artery with concern for acute dissection.  MRA of the head partial occlusion of the left vertebral artery its origin.  Awaiting plan from interventional radiology.  Currently maintained on aspirin as well as Plavix for CVA prophylaxis.  Echocardiogram pending.  Therapy evaluations completed due to patient decreased functional mobility recommendations of physical medicine rehab consult. She prefers CIR.      Review of Systems  Constitutional: Negative for chills and fever.  HENT: Negative for hearing loss.   Eyes: Negative for blurred vision and double vision.  Respiratory: Negative for shortness of breath.   Cardiovascular: Negative for chest  pain and leg swelling.  Gastrointestinal: Positive for constipation and vomiting. Negative for heartburn.  Genitourinary: Negative for dysuria and hematuria.  Musculoskeletal: Positive for falls.  Skin: Negative for rash.  Neurological: Positive for dizziness and weakness.  Psychiatric/Behavioral:       Anxiety  All other systems reviewed and are negative.       Past Medical History:  Diagnosis Date  . AAT (alpha-1-antitrypsin) deficiency (HCC)    . Anxiety    . Heart murmur      mitral valve prolapse  . Hypercholesteremia    . Hypertension    . Pulmonary nodule    . Vitamin D deficiency           Past Surgical History:  Procedure Laterality Date  . ABDOMINAL HYSTERECTOMY      . BREAST BIOPSY      . BREAST EXCISIONAL BIOPSY Bilateral 1990's    2 Left, 1 Right, all benign         Family History  Problem Relation Age of Onset  . Emphysema Father    . Hypertension Brother    . Kidney disease Brother      Social History:  reports that she has never smoked. She has never used smokeless tobacco. She reports that she does not drink alcohol and does not use drugs. Allergies: No Known Allergies       Medications Prior to Admission  Medication Sig Dispense Refill  . acetaminophen (TYLENOL) 500 MG tablet Take 1,000 mg by mouth every 6 (six)  hours as needed for mild pain or headache.      . albuterol (VENTOLIN HFA) 108 (90 Base) MCG/ACT inhaler Inhale 2 puffs into the lungs every 6 (six) hours as needed for wheezing or shortness of breath. 18 g 6  . Biotin 1 MG CAPS Take 1 mg by mouth daily.      . cholecalciferol (VITAMIN D) 400 units TABS tablet Take 400 Units by mouth.      . hydrochlorothiazide (HYDRODIURIL) 25 MG tablet Take 25 mg by mouth daily.      . naproxen sodium (ALEVE) 220 MG tablet Take 220 mg by mouth daily as needed (pain/headace).      Marland Kitchen OVER THE COUNTER MEDICATION Take 1 tablet by mouth daily. Stress relief gummy---olly brand      . vitamin B-12 (CYANOCOBALAMIN)  1000 MCG tablet Take 1,000 mcg by mouth daily.      Marland Kitchen VITAMIN E PO Take 1 tablet by mouth as needed.          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) Functional Status:  Mobility: Bed Mobility Overal bed mobility: Needs Assistance Bed Mobility: Sidelying to Sit Sidelying to sit: Min assist Transfers Overall transfer level: Needs assistance Equipment used: Rolling walker (2 wheeled),None Transfers: Sit to/from [employer] Sit to Stand: Mod assist,+2 physical assistance,+2 safety/equipment Stand pivot transfers: Mod assist,+2 physical assistance,+2 safety/equipment General transfer comment: initial stand-pivot with mod assist +2 without device; pt with difficulty fully standing and with left lean; with RW mod assist +2 to stand and pivot BSC to recliner Ambulation/Gait Ambulation/Gait assistance: Mod assist,+2 physical assistance,+2 safety/equipment Gait Distance (Feet): 2 Feet Assistive device: Rolling walker (2 wheeled) Gait Pattern/deviations: Step-to pattern General Gait Details: pivotal steps with RW to recliner   ADL: ADL Overall ADL's : Needs assistance/impaired Eating/Feeding: Set up,Sitting Eating/Feeding Details (indicate cue type and reason): recliner, with increased time Grooming: Set up,Sitting Grooming Details (indicate cue type and reason): recliner, with increased time Upper Body Bathing: Minimal assistance,Sitting Upper Body Bathing Details (indicate cue type and reason):  recliner, with increased time Lower Body Bathing: Maximal assistance Lower Body Bathing Details (indicate cue type and reason): Mod A +2 sit<>stand Upper Body Dressing : Moderate assistance,Sitting Upper Body Dressing Details (indicate cue type and reason): recliner, with increased time Lower Body Dressing: Maximal assistance Lower Body Dressing Details (indicate cue type and reason): Mod A +2 sit<>stand Toilet Transfer: Moderate assistance,+2 for physical assistance,+2 for safety/equipment,RW,Stand-pivot,BSC Toileting - Clothing Manipulation Details (indicate cue type and reason): Can wipe front peri area while seated on BSC, needs +2 Mod A sit<>stand and stand pivot   Cognition: Cognition Overall Cognitive Status: Impaired/Different from baseline Arousal/Alertness: Lethargic Orientation Level: Oriented X4 Attention: Sustained Sustained Attention: Impaired Sustained Attention Impairment: Verbal basic Memory: Appears intact Awareness: Appears intact Cognition Arousal/Alertness: Awake/alert Behavior During Therapy: Flat affect Overall Cognitive Status: Impaired/Different from baseline Area of Impairment: Safety/judgement,Awareness,Problem solving Safety/Judgement: Decreased awareness of safety,Decreased awareness of deficits Awareness: Emergent Problem Solving: Slow processing,Requires verbal cues,Requires tactile cues General Comments: At times slow to respond to questions. Very low voice output   Blood pressure (!) 129/53,  pulse 60, temperature 98 F (36.7 C), temperature source Oral, resp. rate 18, SpO2 100 %.    Physical Exam Gen: no distress, normal appearing HEENT: oral mucosa pink and moist, NCAT Cardio: Reg rate Chest: normal effort, normal rate of breathing Abd: soft, non-distended Ext: no edema Psych: pleasant, normal affect Skin: intact Neurological:     Comments: Patient is a bit lethargic but arousable.  Makes eye contact with examiner provides name and age. 4/5  strength throughout right side, 4-/5 on left side. Alert and oriented x3     Lab Results Last 24 Hours       Results for orders placed or performed during the hospital encounter of 05/18/20 (from the past 24 hour(s))  CBC with Differential     Status: Abnormal    Collection Time: 05/18/20  9:19 PM  Result Value Ref Range    WBC 11.6 (H) 4.0 - 10.5 K/uL    RBC 4.28 3.87 - 5.11 MIL/uL    Hemoglobin 12.7 12.0 - 15.0 g/dL    HCT 69.6 29.5 - 28.4 %    MCV 90.2 80.0 - 100.0 fL    MCH 29.7 26.0 - 34.0 pg    MCHC 32.9 30.0 - 36.0 g/dL    RDW 13.2 44.0 - 10.2 %    Platelets 206 150 - 400 K/uL    nRBC 0.0 0.0 - 0.2 %    Neutrophils Relative % 83 %    Neutro Abs 9.6 (H) 1.7 - 7.7 K/uL    Lymphocytes Relative 12 %    Lymphs Abs 1.4 0.7 - 4.0 K/uL    Monocytes Relative 4 %    Monocytes Absolute 0.5 0.1 - 1.0 K/uL    Eosinophils Relative 0 %    Eosinophils Absolute 0.0 0.0 - 0.5 K/uL    Basophils Relative 0 %    Basophils Absolute 0.0 0.0 - 0.1 K/uL    Immature Granulocytes 1 %    Abs Immature Granulocytes 0.08 (H) 0.00 - 0.07 K/uL  Basic metabolic panel     Status: Abnormal    Collection Time: 05/18/20  9:19 PM  Result Value Ref Range    Sodium 137 135 - 145 mmol/L    Potassium 2.7 (LL) 3.5 - 5.1 mmol/L    Chloride 101 98 - 111 mmol/L    CO2 25 22 - 32 mmol/L    Glucose, Bld 143 (H) 70 - 99 mg/dL    BUN 12 8 - 23 mg/dL    Creatinine, Ser 7.25 0.44 - 1.00 mg/dL    Calcium 9.2 8.9 - 36.6 mg/dL    GFR, Estimated >44 >03 mL/min    Anion gap 11 5 - 15  Troponin I (High Sensitivity)     Status: None    Collection Time: 05/18/20  9:19 PM  Result Value Ref Range    Troponin I (High Sensitivity) 5 <18 ng/L  Magnesium     Status: None    Collection Time: 05/18/20  9:19 PM  Result Value Ref Range    Magnesium 1.8 1.7 - 2.4 mg/dL  Resp Panel by RT-PCR (Flu A&B, Covid) Nasopharyngeal Swab     Status: None    Collection Time: 05/18/20 10:08 PM    Specimen: Nasopharyngeal Swab;  Nasopharyngeal(NP) swabs in vial transport medium  Result Value Ref Range    SARS Coronavirus 2 by RT PCR NEGATIVE NEGATIVE    Influenza A by PCR NEGATIVE NEGATIVE    Influenza B by PCR NEGATIVE NEGATIVE  Ethanol     Status: None    Collection Time: 05/18/20 10:29 PM  Result Value Ref Range    Alcohol, Ethyl (B) <10 <10 mg/dL  Salicylate level     Status: Abnormal    Collection Time: 05/18/20 10:29 PM  Result Value Ref Range    Salicylate Lvl <7.0 (L) 7.0 - 30.0 mg/dL  Acetaminophen level     Status: Abnormal    Collection Time: 05/18/20 10:29 PM  Result Value Ref Range    Acetaminophen (Tylenol), Serum <10 (L) 10 - 30 ug/mL  Troponin I (High Sensitivity)     Status: None    Collection Time: 05/18/20 11:19 PM  Result Value Ref Range    Troponin I (High Sensitivity) 5 <18 ng/L  Urinalysis, Routine w reflex microscopic Urine, Clean Catch     Status: Abnormal    Collection Time: 05/19/20  3:40 AM  Result Value Ref Range    Color, Urine YELLOW YELLOW    APPearance CLEAR CLEAR    Specific Gravity, Urine 1.019 1.005 - 1.030    pH 7.0 5.0 - 8.0    Glucose, UA NEGATIVE NEGATIVE mg/dL    Hgb urine dipstick NEGATIVE NEGATIVE    Bilirubin Urine NEGATIVE NEGATIVE    Ketones, ur 20 (A) NEGATIVE mg/dL    Protein, ur NEGATIVE NEGATIVE mg/dL    Nitrite NEGATIVE NEGATIVE    Leukocytes,Ua NEGATIVE NEGATIVE  Rapid urine drug screen (hospital performed)     Status: None    Collection Time: 05/19/20  3:40 AM  Result Value Ref Range    Opiates NONE DETECTED NONE DETECTED    Cocaine NONE DETECTED NONE DETECTED    Benzodiazepines NONE DETECTED NONE DETECTED    Amphetamines NONE DETECTED NONE DETECTED    Tetrahydrocannabinol NONE DETECTED NONE DETECTED    Barbiturates NONE DETECTED NONE DETECTED  HIV Antibody (routine testing w rflx)     Status: None    Collection Time: 05/19/20  3:49 AM  Result Value Ref Range    HIV Screen 4th Generation wRfx Non Reactive Non Reactive  Hemoglobin A1c      Status: None    Collection Time: 05/19/20  3:49 AM  Result Value Ref Range    Hgb A1c MFr Bld 5.5 4.8 - 5.6 %    Mean Plasma Glucose 111.15 mg/dL  Lipid panel     Status: Abnormal    Collection Time: 05/19/20  3:49 AM  Result Value Ref Range    Cholesterol 237 (H) 0 - 200 mg/dL    Triglycerides 53 <161 mg/dL    HDL 66 >09 mg/dL    Total CHOL/HDL Ratio 3.6 RATIO    VLDL 11 0 - 40 mg/dL    LDL Cholesterol 604 (H) 0 - 99 mg/dL  TSH     Status: None    Collection Time: 05/19/20  3:49 AM  Result Value Ref Range    TSH 0.647 0.350 - 4.500 uIU/mL  CBC     Status: Abnormal    Collection Time: 05/19/20  3:49 AM  Result Value Ref Range    WBC 10.8 (H) 4.0 - 10.5 K/uL    RBC 4.13 3.87 - 5.11 MIL/uL    Hemoglobin 12.4 12.0 - 15.0 g/dL    HCT 54.0 98.1 - 19.1 %    MCV 88.1 80.0 - 100.0 fL    MCH 30.0 26.0 - 34.0 pg    MCHC 34.1 30.0 - 36.0 g/dL    RDW 47.8 29.5 - 62.1 %  Platelets 215 150 - 400 K/uL    nRBC 0.0 0.0 - 0.2 %  MRSA PCR Screening     Status: None    Collection Time: 05/19/20  6:50 AM    Specimen: Nasopharyngeal  Result Value Ref Range    MRSA by PCR NEGATIVE NEGATIVE  Glucose, capillary     Status: None    Collection Time: 05/19/20  7:04 AM  Result Value Ref Range    Glucose-Capillary 84 70 - 99 mg/dL       Imaging Results (Last 48 hours)  MR ANGIO HEAD WO CONTRAST   Result Date: 05/19/2020 CLINICAL DATA:  Initial evaluation for neuro deficit, stroke suspected, vertigo. EXAM: MRI HEAD WITHOUT CONTRAST MRA HEAD WITHOUT CONTRAST MRA NECK WITHOUT AND WITH CONTRAST TECHNIQUE: Multiplanar, multiecho pulse sequences of the brain and surrounding structures were obtained without intravenous contrast. Angiographic images of the Circle of Willis were obtained using MRA technique without intravenous contrast. Angiographic images of the neck were obtained using MRA technique without and with intravenous contrast. Carotid stenosis measurements (when applicable) are obtained  utilizing NASCET criteria, using the distal internal carotid diameter as the denominator. CONTRAST:  6.34mL GADAVIST GADOBUTROL 1 MMOL/ML IV SOLN COMPARISON:  Prior CT from 03/30/2018. FINDINGS: MRI HEAD FINDINGS Brain: Cerebral volume within normal limits for patient age. No significant cerebral white matter disease for age. Patchy multifocal areas of restricted diffusion seen involving the left cerebellum, most pronounced inferiorly. Minimal patchy involvement of the central cerebellar vermis. Additional patchy small volume areas of restricted diffusion seen involving the paramedian right temporal occipital region. Mild diffusion abnormality also noted at the left midbrain/pons. No associated hemorrhage or mass effect. Otherwise, gray-white matter differentiation well maintained. No other areas of chronic cortical infarction. No foci of susceptibility artifact to suggest acute or chronic intracranial hemorrhage. No mass lesion, midline shift or mass effect. No hydrocephalus or extra-axial fluid collection. Pituitary gland suprasellar region normal. Midline structures intact. Vascular: Major intracranial vascular flow voids are maintained. Skull and upper cervical spine: Craniocervical junction within normal limits. Bone marrow signal intensity normal. No scalp soft tissue abnormality. Reversal of the normal upper cervical lordosis noted. Sinuses/Orbits: Globes and orbital soft tissues demonstrate no acute finding. Paranasal sinuses are clear. No mastoid effusion. Inner ear structures grossly normal. Other: None. MRA HEAD FINDINGS ANTERIOR CIRCULATION: Visualized distal cervical segments are both widely patent with antegrade flow. Petrous, cavernous, and supraclinoid segments patent without stenosis or other abnormality. A1 segments widely patent. Normal anterior communicating artery complex. Anterior cerebral arteries patent to their distal aspects without stenosis. No M1 stenosis or occlusion. Normal MCA  bifurcations. Distal MCA branches well perfused and symmetric. POSTERIOR CIRCULATION: Dominant right vertebral artery widely patent to the vertebrobasilar junction. Right PICA patent and well perfused. Faint flow seen within the partially visualized distal left vertebral artery, with relatively absent flow within the left V3 segment. The left vertebral artery appears occluded as it courses into the cranial vault. Attenuated irregular flow seen within the left V4 segment distally, and remains grossly patent to the vertebrobasilar junction. Left PICA grossly patent at its origin. Basilar patent to its distal aspect without stenosis or visible abnormality. Right superior cerebral artery widely patent. Left SCA faintly visible and appears grossly patent on time-of-flight sequence, not seen on MIP reconstructions. Both PCAs primarily supplied via the basilar. PCAs appear well perfused to their distal aspects. No intracranial aneurysm. MRA NECK FINDINGS AORTIC ARCH: Visualized aortic arch normal in caliber with normal 3 vessel morphology. No  hemodynamically significant stenosis seen about the origin of the great vessels. RIGHT CAROTID SYSTEM: Right CCA patent from its origin to the bifurcation without stenosis. Mild for age atheromatous irregularity about the right carotid bulb with no more than mild 20-25% stenosis by NASCET criteria. Right ICA patent distally without stenosis, evidence for dissection or occlusion. LEFT CAROTID SYSTEM: Left common carotid artery widely patent from its origin to the bifurcation. Mild for age atheromatous irregularity about the left carotid bulb with no more than mild 20-25% by NASCET criteria. Left ICA otherwise patent without stenosis, evidence for dissection, or occlusion. Minimal irregularity about the mid cervical left ICA noted. VERTEBRAL ARTERIES: Both vertebral arteries arise from the subclavian arteries. Right vertebral artery likely dominant. There is a short-segment moderate  approximate 50% stenosis involving the pre foraminal right V1 segment (series 1058, image 10). Otherwise, the right vertebral artery is widely patent within the neck without stenosis, evidence for dissection, or occlusion. On the left, the left vertebral artery appears occluded at its origin (series 18, image 32). Distally, the left ICA is markedly attenuated and diminutive, with near occlusion at its distal V2 segment. Some flow is seen within the left V3 segment, but is also markedly attenuated and irregular. Given the presence of acute posterior circulation infarcts, finding raises the possibility for an acute dissection. IMPRESSION: MRI HEAD IMPRESSION: 1. Patchy acute ischemic nonhemorrhagic infarcts involving the left cerebellum, cerebellar vermis, right temporoccipital region, and left midbrain/pons. 2. Otherwise normal brain MRI for age. MRA HEAD IMPRESSION: 1. Occlusion of the left vertebral artery with concern for acute dissection as below. Attenuated irregular flow within the left V4 segment distally may in part be retrograde in nature. Dominant right vertebral artery widely patent. 2. Poor visualization of the left SCA, which also may be partially occluded given the distribution of infarcts. 3. Widely patent anterior circulation. MRA NECK IMPRESSION: 1. 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 Given the presence of acute posterior circulation infarcts, these findings raise the possibility for an acute arterial dissection. Correlation with dedicated CTA could be performed for further evaluation as warranted. 2. Short-segment moderate approximate 50% stenosis involving the pre foraminal right V1 segment. Otherwise wide patency of the dominant right vertebral artery. 3. Mild for age atheromatous change about the carotid bifurcations without significant stenosis. Otherwise wide patency of both  carotid artery systems in the neck. Critical Value/emergent results were called by telephone at the time of interpretation on 05/19/2020 at 1:34 a.m. to provider Dr. Marily Memos, who verbally acknowledged these results. Electronically Signed   By: Rise Mu M.D.   On: 05/19/2020 01:56    MR ANGIO NECK W WO CONTRAST   Result Date: 05/19/2020 CLINICAL DATA:  Initial evaluation for neuro deficit, stroke suspected, vertigo. EXAM: MRI HEAD WITHOUT CONTRAST MRA HEAD WITHOUT CONTRAST MRA NECK WITHOUT AND WITH CONTRAST TECHNIQUE: Multiplanar, multiecho pulse sequences of the brain and surrounding structures were obtained without intravenous contrast. Angiographic images of the Circle of Willis were obtained using MRA technique without intravenous contrast. Angiographic images of the neck were obtained using MRA technique without and with intravenous contrast. Carotid stenosis measurements (when applicable) are obtained utilizing NASCET criteria, using the distal internal carotid diameter as the denominator. CONTRAST:  6.51mL GADAVIST GADOBUTROL 1 MMOL/ML IV SOLN COMPARISON:  Prior CT from 03/30/2018. FINDINGS: MRI HEAD FINDINGS Brain: Cerebral volume within normal limits for patient age. No significant  cerebral white matter disease for age. Patchy multifocal areas of restricted diffusion seen involving the left cerebellum, most pronounced inferiorly. Minimal patchy involvement of the central cerebellar vermis. Additional patchy small volume areas of restricted diffusion seen involving the paramedian right temporal occipital region. Mild diffusion abnormality also noted at the left midbrain/pons. No associated hemorrhage or mass effect. Otherwise, gray-white matter differentiation well maintained. No other areas of chronic cortical infarction. No foci of susceptibility artifact to suggest acute or chronic intracranial hemorrhage. No mass lesion, midline shift or mass effect. No hydrocephalus or extra-axial fluid  collection. Pituitary gland suprasellar region normal. Midline structures intact. Vascular: Major intracranial vascular flow voids are maintained. Skull and upper cervical spine: Craniocervical junction within normal limits. Bone marrow signal intensity normal. No scalp soft tissue abnormality. Reversal of the normal upper cervical lordosis noted. Sinuses/Orbits: Globes and orbital soft tissues demonstrate no acute finding. Paranasal sinuses are clear. No mastoid effusion. Inner ear structures grossly normal. Other: None. MRA HEAD FINDINGS ANTERIOR CIRCULATION: Visualized distal cervical segments are both widely patent with antegrade flow. Petrous, cavernous, and supraclinoid segments patent without stenosis or other abnormality. A1 segments widely patent. Normal anterior communicating artery complex. Anterior cerebral arteries patent to their distal aspects without stenosis. No M1 stenosis or occlusion. Normal MCA bifurcations. Distal MCA branches well perfused and symmetric. POSTERIOR CIRCULATION: Dominant right vertebral artery widely patent to the vertebrobasilar junction. Right PICA patent and well perfused. Faint flow seen within the partially visualized distal left vertebral artery, with relatively absent flow within the left V3 segment. The left vertebral artery appears occluded as it courses into the cranial vault. Attenuated irregular flow seen within the left V4 segment distally, and remains grossly patent to the vertebrobasilar junction. Left PICA grossly patent at its origin. Basilar patent to its distal aspect without stenosis or visible abnormality. Right superior cerebral artery widely patent. Left SCA faintly visible and appears grossly patent on time-of-flight sequence, not seen on MIP reconstructions. Both PCAs primarily supplied via the basilar. PCAs appear well perfused to their distal aspects. No intracranial aneurysm. MRA NECK FINDINGS AORTIC ARCH: Visualized aortic arch normal in caliber with  normal 3 vessel morphology. No hemodynamically significant stenosis seen about the origin of the great vessels. RIGHT CAROTID SYSTEM: Right CCA patent from its origin to the bifurcation without stenosis. Mild for age atheromatous irregularity about the right carotid bulb with no more than mild 20-25% stenosis by NASCET criteria. Right ICA patent distally without stenosis, evidence for dissection or occlusion. LEFT CAROTID SYSTEM: Left common carotid artery widely patent from its origin to the bifurcation. Mild for age atheromatous irregularity about the left carotid bulb with no more than mild 20-25% by NASCET criteria. Left ICA otherwise patent without stenosis, evidence for dissection, or occlusion. Minimal irregularity about the mid cervical left ICA noted. VERTEBRAL ARTERIES: Both vertebral arteries arise from the subclavian arteries. Right vertebral artery likely dominant. There is a short-segment moderate approximate 50% stenosis involving the pre foraminal right V1 segment (series 1058, image 10). Otherwise, the right vertebral artery is widely patent within the neck without stenosis, evidence for dissection, or occlusion. On the left, the left vertebral artery appears occluded at its origin (series 18, image 32). Distally, the left ICA is markedly attenuated and diminutive, with near occlusion at its distal V2 segment. Some flow is seen within the left V3 segment, but is also markedly attenuated and irregular. Given the presence of acute posterior circulation infarcts, finding raises the possibility for an acute dissection.  IMPRESSION: MRI HEAD IMPRESSION: 1. Patchy acute ischemic nonhemorrhagic infarcts involving the left cerebellum, cerebellar vermis, right temporoccipital region, and left midbrain/pons. 2. Otherwise normal brain MRI for age. MRA HEAD IMPRESSION: 1. Occlusion of the left vertebral artery with concern for acute dissection as below. Attenuated irregular flow within the left V4 segment  distally may in part be retrograde in nature. Dominant right vertebral artery widely patent. 2. Poor visualization of the left SCA, which also may be partially occluded given the distribution of infarcts. 3. Widely patent anterior circulation. MRA NECK IMPRESSION: 1. 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 Given the presence of acute posterior circulation infarcts, these findings raise the possibility for an acute arterial dissection. Correlation with dedicated CTA could be performed for further evaluation as warranted. 2. Short-segment moderate approximate 50% stenosis involving the pre foraminal right V1 segment. Otherwise wide patency of the dominant right vertebral artery. 3. Mild for age atheromatous change about the carotid bifurcations without significant stenosis. Otherwise wide patency of both carotid artery systems in the neck. Critical Value/emergent results were called by telephone at the time of interpretation on 05/19/2020 at 1:34 a.m. to provider Dr. Marily Memos, who verbally acknowledged these results. Electronically Signed   By: Rise Mu M.D.   On: 05/19/2020 01:56    MR BRAIN WO CONTRAST   Result Date: 05/19/2020 CLINICAL DATA:  Initial evaluation for neuro deficit, stroke suspected, vertigo. EXAM: MRI HEAD WITHOUT CONTRAST MRA HEAD WITHOUT CONTRAST MRA NECK WITHOUT AND WITH CONTRAST TECHNIQUE: Multiplanar, multiecho pulse sequences of the brain and surrounding structures were obtained without intravenous contrast. Angiographic images of the Circle of Willis were obtained using MRA technique without intravenous contrast. Angiographic images of the neck were obtained using MRA technique without and with intravenous contrast. Carotid stenosis measurements (when applicable) are obtained utilizing NASCET criteria, using the distal internal carotid diameter as the denominator.  CONTRAST:  6.59mL GADAVIST GADOBUTROL 1 MMOL/ML IV SOLN COMPARISON:  Prior CT from 03/30/2018. FINDINGS: MRI HEAD FINDINGS Brain: Cerebral volume within normal limits for patient age. No significant cerebral white matter disease for age. Patchy multifocal areas of restricted diffusion seen involving the left cerebellum, most pronounced inferiorly. Minimal patchy involvement of the central cerebellar vermis. Additional patchy small volume areas of restricted diffusion seen involving the paramedian right temporal occipital region. Mild diffusion abnormality also noted at the left midbrain/pons. No associated hemorrhage or mass effect. Otherwise, gray-white matter differentiation well maintained. No other areas of chronic cortical infarction. No foci of susceptibility artifact to suggest acute or chronic intracranial hemorrhage. No mass lesion, midline shift or mass effect. No hydrocephalus or extra-axial fluid collection. Pituitary gland suprasellar region normal. Midline structures intact. Vascular: Major intracranial vascular flow voids are maintained. Skull and upper cervical spine: Craniocervical junction within normal limits. Bone marrow signal intensity normal. No scalp soft tissue abnormality. Reversal of the normal upper cervical lordosis noted. Sinuses/Orbits: Globes and orbital soft tissues demonstrate no acute finding. Paranasal sinuses are clear. No mastoid effusion. Inner ear structures grossly normal. Other: None. MRA HEAD FINDINGS ANTERIOR CIRCULATION: Visualized distal cervical segments are both widely patent with antegrade flow. Petrous, cavernous, and supraclinoid segments patent without stenosis or other abnormality. A1 segments widely patent. Normal anterior communicating artery complex. Anterior cerebral arteries patent to their distal aspects without stenosis. No M1 stenosis or occlusion. Normal MCA bifurcations. Distal MCA branches well perfused and symmetric. POSTERIOR CIRCULATION:  Dominant  right vertebral artery widely patent to the vertebrobasilar junction. Right PICA patent and well perfused. Faint flow seen within the partially visualized distal left vertebral artery, with relatively absent flow within the left V3 segment. The left vertebral artery appears occluded as it courses into the cranial vault. Attenuated irregular flow seen within the left V4 segment distally, and remains grossly patent to the vertebrobasilar junction. Left PICA grossly patent at its origin. Basilar patent to its distal aspect without stenosis or visible abnormality. Right superior cerebral artery widely patent. Left SCA faintly visible and appears grossly patent on time-of-flight sequence, not seen on MIP reconstructions. Both PCAs primarily supplied via the basilar. PCAs appear well perfused to their distal aspects. No intracranial aneurysm. MRA NECK FINDINGS AORTIC ARCH: Visualized aortic arch normal in caliber with normal 3 vessel morphology. No hemodynamically significant stenosis seen about the origin of the great vessels. RIGHT CAROTID SYSTEM: Right CCA patent from its origin to the bifurcation without stenosis. Mild for age atheromatous irregularity about the right carotid bulb with no more than mild 20-25% stenosis by NASCET criteria. Right ICA patent distally without stenosis, evidence for dissection or occlusion. LEFT CAROTID SYSTEM: Left common carotid artery widely patent from its origin to the bifurcation. Mild for age atheromatous irregularity about the left carotid bulb with no more than mild 20-25% by NASCET criteria. Left ICA otherwise patent without stenosis, evidence for dissection, or occlusion. Minimal irregularity about the mid cervical left ICA noted. VERTEBRAL ARTERIES: Both vertebral arteries arise from the subclavian arteries. Right vertebral artery likely dominant. There is a short-segment moderate approximate 50% stenosis involving the pre foraminal right V1 segment (series 1058, image 10).  Otherwise, the right vertebral artery is widely patent within the neck without stenosis, evidence for dissection, or occlusion. On the left, the left vertebral artery appears occluded at its origin (series 18, image 32). Distally, the left ICA is markedly attenuated and diminutive, with near occlusion at its distal V2 segment. Some flow is seen within the left V3 segment, but is also markedly attenuated and irregular. Given the presence of acute posterior circulation infarcts, finding raises the possibility for an acute dissection. IMPRESSION: MRI HEAD IMPRESSION: 1. Patchy acute ischemic nonhemorrhagic infarcts involving the left cerebellum, cerebellar vermis, right temporoccipital region, and left midbrain/pons. 2. Otherwise normal brain MRI for age. MRA HEAD IMPRESSION: 1. Occlusion of the left vertebral artery with concern for acute dissection as below. Attenuated irregular flow within the left V4 segment distally may in part be retrograde in nature. Dominant right vertebral artery widely patent. 2. Poor visualization of the left SCA, which also may be partially occluded given the distribution of infarcts. 3. Widely patent anterior circulation. MRA NECK IMPRESSION: 1. 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 Given the presence of acute posterior circulation infarcts, these findings raise the possibility for an acute arterial dissection. Correlation with dedicated CTA could be performed for further evaluation as warranted. 2. Short-segment moderate approximate 50% stenosis involving the pre foraminal right V1 segment. Otherwise wide patency of the dominant right vertebral artery. 3. Mild for age atheromatous change about the carotid bifurcations without significant stenosis. Otherwise wide patency of both carotid artery systems in the neck. Critical Value/emergent results were called by telephone at the  time of interpretation on 05/19/2020 at 1:34 a.m. to provider Dr. Marily Memos, who verbally acknowledged these results. Electronically Signed   By: Sharlet Salina  Phill Myron M.D.   On: 05/19/2020 01:56    DG Chest Portable 1 View   Result Date: 05/18/2020 CLINICAL DATA:  Dizziness for several hours EXAM: PORTABLE CHEST 1 VIEW COMPARISON:  12/26/2019 CT FINDINGS: The heart size and mediastinal contours are within normal limits. Both lungs are clear. The visualized skeletal structures are unremarkable. IMPRESSION: No active disease. Electronically Signed   By: Alcide Clever M.D.   On: 05/18/2020 22:22       Assessment/Plan: Diagnosis: Acute vetebrobasilar strokes 1. Does the need for close, 24 hr/day medical supervision in concert with the patient's rehab needs make it unreasonable for this patient to be served in a less intensive setting? Yes 2. Co-Morbidities requiring supervision/potential complications:  1. Cerebral edema 2. Left extracranial vertebral artery dissection 3. Trauma to soft tissue of left neck 4. Mild left sided weakness: will benefit from intensive PT and OT 5. Hypokalemia: will benefit from regular monitoring 6. Mid ataxia in left upper>lower extremities: will benefit from intensive PT and OT 3. Due to bladder management, bowel management, safety, skin/wound care, disease management, medication administration, pain management and patient education, does the patient require 24 hr/day rehab nursing? Yes 4. Does the patient require coordinated care of a physician, rehab nurse, therapy disciplines of PT, OT, SLP to address physical and functional deficits in the context of the above medical diagnosis(es)? Yes Addressing deficits in the following areas: balance, endurance, locomotion, strength, transferring, bowel/bladder control, bathing, dressing, feeding, grooming, toileting, speech and psychosocial support 5. Can the patient actively participate in an intensive therapy program of at  least 3 hrs of therapy per day at least 5 days per week? Yes 6. The potential for patient to make measurable gains while on inpatient rehab is good 7. Anticipated functional outcomes upon discharge from inpatient rehab are modified independent  with PT, modified independent with OT, modified independent with SLP. 8. Estimated rehab length of stay to reach the above functional goals is: 10-14 days 9. Anticipated discharge destination: Home 10. Overall Rehab/Functional Prognosis: good   RECOMMENDATIONS: This patient's condition is appropriate for continued rehabilitative care in the following setting: CIR Patient has agreed to participate in recommended program. Yes Note that insurance prior authorization may be required for reimbursement for recommended care.   Comment: Thank you for this consult. Admission coordinator to follow.    I have personally performed a face to face diagnostic evaluation, including, but not limited to relevant history and physical exam findings, of this patient and developed relevant assessment and plan.  Additionally, I have reviewed and concur with the physician assistant's documentation above.   Sula Soda, MD    Charlton Amor, PA-C 05/19/2020          Revision History                        Routing History                        Note Details  Author Carlis Abbott, Drema Pry, MD File Time 05/19/2020  7:59 PM  Author Type Physician Status Signed  Last Editor Horton Chin, MD Service Physical Medicine and Rehabilitation

## 2020-05-23 NOTE — Discharge Instructions (Signed)
Inpatient Rehab Discharge Instructions  Jeanette Barnes Discharge date and time: No discharge date for patient encounter.   Activities/Precautions/ Functional Status: Activity: activity as tolerated Diet: regular diet Wound Care: Routine skin checks Functional status:  ___ No restrictions     ___ Walk up steps independently ___ 24/7 supervision/assistance   ___ Walk up steps with assistance ___ Intermittent supervision/assistance  ___ Bathe/dress independently ___ Walk with walker       _x STROKE/TIA DISCHARGE INSTRUCTIONS SMOKING Cigarette smoking nearly doubles your risk of having a stroke & is the single most alterable risk factor  If you smoke or have smoked in the last 12 months, you are advised to quit smoking for your health.  Most of the excess cardiovascular risk related to smoking disappears within a year of stopping.  Ask you doctor about anti-smoking medications  [facility] Quit Line: 1-800-QUIT NOW  Free Smoking Cessation Classes [phone]  CHOLESTEROL Know your levels; limit fat & cholesterol in your diet  Lipid Panel     Component Value Date/Time   CHOL 237 (H) 05/19/2020 0349   TRIG 53 05/19/2020 0349   HDL 66 05/19/2020 0349   CHOLHDL 3.6 05/19/2020 0349   VLDL 11 05/19/2020 0349   LDLCALC 160 (H) 05/19/2020 0349      Many patients benefit from treatment even if their cholesterol is at goal.  Goal: Total Cholesterol (CHOL) less than 160  Goal:  Triglycerides (TRIG) less than 150  Goal:  HDL greater than 40  Goal:  LDL (LDLCALC) less than 100   BLOOD PRESSURE American Stroke Association blood pressure target is less that 120/80 mm/Hg  Your discharge blood pressure is:     Monitor your blood pressure  Limit your salt and alcohol intake  Many individuals will require more than one medication for high blood pressure  DIABETES (A1c is a blood sugar average for last 3 months) Goal HGBA1c is under 7% (HBGA1c is blood sugar average for last 3 months)   Diabetes: No known diagnosis of diabetes    Lab Results  Component Value Date   HGBA1C 5.5 05/19/2020     Your HGBA1c can be lowered with medications, healthy diet, and exercise.  Check your blood sugar as directed by your physician  Call your physician if you experience unexplained or low blood sugars.  PHYSICAL ACTIVITY/REHABILITATION Goal is 30 minutes at least 4 days per week  Activity: Increase activity slowly, Therapies: Physical Therapy: Home Health Return to work:   Activity decreases your risk of heart attack and stroke and makes your heart stronger.  It helps control your weight and blood pressure; helps you relax and can improve your mood.  Participate in a regular exercise program.  Talk with your doctor about the best form of exercise for you (dancing, walking, swimming, cycling).  DIET/WEIGHT Goal is to maintain a healthy weight  Your discharge diet is:  Diet Order    None      liquids Your height is:    Your current weight is:   Your Body Mass Index (BMI) is:     Following the type of diet specifically designed for you will help prevent another stroke.  Your goal weight range is:    Your goal Body Mass Index (BMI) is 19-24.  Healthy food habits can help reduce 3 risk factors for stroke:  High cholesterol, hypertension, and excess weight.  RESOURCES Stroke/Support Group:  Call [phone]   STROKE EDUCATION PROVIDED/REVIEWED AND GIVEN TO PATIENT Stroke warning signs  and symptoms How to activate emergency medical system (call 911). Medications prescribed at discharge. Need for follow-up after discharge. Personal risk factors for stroke. Pneumonia vaccine given:  Flu vaccine given:  My questions have been answered, the writing is legible, and I understand these instructions.  I will adhere to these goals & educational materials that have been provided to me after my discharge from the hospital.   _ Bathe/dress with assistance ___ Walk  Independently    ___ Shower independently ___ Walk with assistance    ___ Shower with assistance ___ No alcohol     ___ Return to work/school ________  Special Instructions:  No driving smoking or alcohol  Continue aspirin 81 mg daily and Plavix 75 mg day x3 months then aspirin alone  My questions have been answered and I understand these instructions. I will adhere to these goals and the provided educational materials after my discharge from the hospital.  Patient/Caregiver Signature _______________________________ Date __________  Clinician Signature _______________________________________ Date __________  Please bring this form and your medication list with you to all your follow-up doctor's appointments.

## 2020-05-23 NOTE — Progress Notes (Signed)
Inpatient Rehabilitation  Patient information reviewed and entered into eRehab system by Octavian Godek M. Rayanna Matusik, M.A., CCC/SLP, PPS Coordinator.  Information including medical coding, functional ability and quality indicators will be reviewed and updated through discharge.    

## 2020-05-23 NOTE — Progress Notes (Signed)
Patient has a bed on [facility] [address]. Called and gave report to RN. Care Plan and Education have been completed.  Patient has all belongings. IV in [facility] will remain with patient. Will transport patient to [facility] in wheelchair

## 2020-05-24 DIAGNOSIS — I63212 Cerebral infarction due to unspecified occlusion or stenosis of left vertebral arteries: Secondary | ICD-10-CM | POA: Diagnosis not present

## 2020-05-24 DIAGNOSIS — E785 Hyperlipidemia, unspecified: Secondary | ICD-10-CM

## 2020-05-24 DIAGNOSIS — D62 Acute posthemorrhagic anemia: Secondary | ICD-10-CM | POA: Diagnosis not present

## 2020-05-24 DIAGNOSIS — I10 Essential (primary) hypertension: Secondary | ICD-10-CM | POA: Diagnosis not present

## 2020-05-24 MED ORDER — PANTOPRAZOLE SODIUM 40 MG PO TBEC: 40.0000 mg | DELAYED_RELEASE_TABLET | Freq: Every day | ORAL | 1 refills | Status: DC

## 2020-05-24 NOTE — Evaluation (Signed)
Occupational Therapy Assessment and Plan  Patient Details  Name: Jeanette Barnes MRN: [id number] Date of Birth: November 30, 1955  OT Diagnosis: No diagnostic code appropriate; skilled OT not indicated Rehab Potential: Rehab Potential (ACUTE ONLY):  (pt already completes ADLs at independent level, no rehabilitation indicated at this time) ELOS: 0 days   Today's Date: 05/24/2020 OT Individual Time: 2595-6387 OT Individual Time Calculation (min): 72 min     Hospital Problem: Principal Problem:   Cerebral infarction due to occlusion of left vertebral artery ([place]) Active Problems:   Cerebellar cerebrovascular accident (CVA) without late effect   Essential hypertension   Past Medical History:  Past Medical History:  Diagnosis Date  . AAT (alpha-1-antitrypsin) deficiency ([place])   . Anxiety   . Heart murmur    mitral valve prolapse  . Hypercholesteremia   . Hypertension   . Pulmonary nodule   . Vitamin D deficiency    Past Surgical History:  Past Surgical History:  Procedure Laterality Date  . ABDOMINAL HYSTERECTOMY    . BREAST BIOPSY    . BREAST EXCISIONAL BIOPSY Bilateral 1990's   2 Left, 1 Right, all benign  . IR ANGIO EXTERNAL CAROTID SEL EXT CAROTID BILAT MOD SED  05/21/2020  . IR ANGIO VERTEBRAL SEL SUBCLAVIAN INNOMINATE UNI L MOD SED  05/21/2020  . IR ANGIO VERTEBRAL SEL VERTEBRAL UNI R MOD SED  05/21/2020  . IR US GUIDE VASC ACCESS RIGHT  05/21/2020    Assessment & Plan Clinical Impression: Jeanette Barnes is a 65 year old right-handed female with history of hypertension, hyperlipidemia and mitral valve prolapse.  History taken from chart review and patient.  Patient lives alone.  Reportedly independent prior to admission and active.  1 level townhouse with 2 steps to entry.  She has a daughter in the area.  She presented on 05/18/2020 with dizziness, lethargy, and unstable gait of acute onset.  Patient got up to go to the bathroom had a vomiting episode fell and struck the left side of  her head and neck on the trash bin.  Denies LOC. MRI/MRI showed patchy acute ischemic nonhemorrhagic infarct involving the left cerebellum, cerebellar vermis, right temporal occipital region and left midbrain pons.  MRA of the neck showed occlusion of the left vertebral artery with concern for acute dissection.  MRA of the head partial occlusion of the left vertebral artery at its origin.  Cerebral arteriogram showed occluded left VA at its origin without distal reconstitution.  30% stenosis of proximal right VA and no dissection identified.  Echocardiogram with ejection fraction 60 to 65%, no wall motion abnormalities.  Her admission chemistries were unremarkable except potassium 2.7, glucose 143, troponin negative, urine drug screen negative.  Currently maintained on aspirin and Plavix for CVA prophylaxis x3 months then aspirin alone.  Recommendations for 30-day outpatient cardiac event monitor.  She is tolerating a regular consistency diet.  Therapy evaluations completed and due to patient's dizziness, gait instability patient was admitted for a comprehensive rehab program.  Please see preadmission assessment from earlier today as well.    Patient is currently at independent level during ADL/IADL activity. Skilled OT is not appropriate given pts independent status.   Patient will not benefit from skilled intervention at CIR,  no further OT follow recommended. Pt in agreement.   OT - End of Session Endurance Deficit: No OT Assessment Rehab Potential (ACUTE ONLY):  (pt already completes ADLs at independent level, no rehabilitation indicated at this time) OT Barriers to Discharge: Other (comments) OT Barriers to  Discharge Comments: None OT Patient demonstrates impairments in the following area(s): Other (Comment) (pt demonstrated no functional impairments at time of eval) OT Basic ADL's Functional Problem(s):  (no functional problems observed) OT Advanced ADL's Functional Problem(s):  (no functional  problems observed) OT Transfers Functional Problem(s):  (no functional transfer problems observed) OT Additional Impairment(s): Other (comment) (no advanced impairments observed) OT Plan OT Intensity:  (OT POC not indicated due to pts independent level with ADL/IADL activity at time of evaluation) OT Frequency:  (OT POC not indicated due to pts independent level with ADL/IADL activity at time of evaluation) OT Duration/Estimated Length of Stay: 0 days OT Treatment/Interventions:  (no intervention areas to address) OT Self Feeding Anticipated Outcome(s): Pt already at independent level- no goal OT Basic Self-Care Anticipated Outcome(s): Pt already at independent level- no goal OT Toileting Anticipated Outcome(s): Pt already at independent level- no goal OT Bathroom Transfers Anticipated Outcome(s): Pt already at independent level- no goal OT Recommendation Recommendations for Other Services:  (no services recommended) Patient destination: Home Follow Up Recommendations: None Equipment Recommended: Other (comment) (pt plans to independently purchase a TTB to increase her safety when bathing at home)   OT Evaluation Vital Signs Therapy Vitals Temp: 98.1 F (36.7 C) Pulse Rate: 91 Resp: 18 BP: (!) 111/93 Patient Position (if appropriate): Lying Oxygen Therapy SpO2: 100 % O2 Device: Room Air Home Living/Prior Functioning Home Living Available Help at Discharge: Available PRN/intermittently (daughter + granddaughters) Type of Home: Other(Comment) (townhouse) Home Access: Stairs to enter Technical brewer of Steps: 1 Entrance Stairs-Rails: None Home Layout: One level Bathroom Shower/Tub: Chiropodist: Standard Bathroom Accessibility: Yes  Lives With: Alone IADL History Homemaking Responsibilities: Yes Meal Prep Responsibility: Primary Laundry Responsibility: Primary Cleaning Responsibility: Primary Bill Paying/Finance Responsibility: Primary Shopping  Responsibility: Primary Occupation: Other (comment) (pt has been retired for 2 years and 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 Comments: driving, 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) Vision Baseline Vision/History: Wears glasses Wears Glasses: Distance only Patient Visual Report: No change from baseline Perception  Perception: Within Functional Limits Praxis Praxis: Intact Cognition Overall Cognitive Status: Within Functional Limits for tasks assessed Arousal/Alertness: Awake/alert Orientation Level: Person;Place;Situation Person: Oriented Place: Oriented Situation: Oriented Year: 2022 Month: March Day of Week: Correct Memory: Appears intact Immediate Memory Recall: Sock;Blue;Bed Memory Recall Sock: Without Cue Memory Recall Blue: Without Cue Memory Recall Bed: Without Cue Attention: Focused Focused Attention: Appears intact Awareness: Appears intact Problem Solving: Appears intact Safety/Judgment: Appears intact Sensation Sensation Light Touch: Appears Intact Coordination Gross Motor Movements are Fluid and Coordinated: Yes Fine Motor Movements are Fluid and Coordinated: Yes Coordination and Movement Description: Gross motor and fine motor coordination WNL during ADL/IADL participation Finger Nose Finger Test: WNL Motor  Motor Motor: Within Functional Limits  Trunk/Postural Assessment  Cervical Assessment Cervical Assessment: Within Functional Limits Thoracic Assessment Thoracic Assessment: Within Functional Limits Lumbar  Assessment Lumbar Assessment: Within Functional Limits Postural Control Postural Control: Within Functional Limits  Balance Balance Balance Assessed: Yes Dynamic Standing Balance Dynamic Standing - Balance Support: During functional activity;No upper extremity supported Dynamic Standing - Level of Assistance: 7:  Independent Dynamic Standing - Balance Activities: Lateral lean/weight shifting;Forward lean/weight shifting (toileting) Extremity/Trunk Assessment RUE Assessment RUE Assessment: Within Functional Limits Active Range of Motion (AROM) Comments: WNL General Strength Comments: 4+/5 grossly LUE Assessment LUE Assessment: Within Functional Limits Active Range of Motion (AROM) Comments: WNL General Strength Comments: 4+/5 grossly  Care Tool Care Tool Self Care Eating   Eating Assist Level: Independent    Oral Care    Oral Care Assist Level: Independent    Bathing   Body parts bathed by patient: Right arm;Left arm;Chest;Abdomen;Front perineal area;Buttocks;Right upper leg;Left upper leg;Right lower leg;Left lower leg;Face     Assist Level: Independent    Upper Body Dressing(including orthotics)   What is the patient wearing?: Bra;Pull over shirt   Assist Level: Independent    Lower Body Dressing (excluding footwear)   What is the patient wearing?: Pants Assist for lower body dressing: Independent    Putting on/Taking off footwear   What is the patient wearing?: Ted hose;Non-skid slipper socks Assist for footwear: Independent       Care Tool Toileting Toileting activity   Assist for toileting: Independent     Care Tool Bed Mobility Roll left and right activity        Sit to lying activity        Lying to sitting edge of bed activity         Care Tool Transfers Sit to stand transfer        Chair/bed transfer         Toilet transfer   Assist Level: Independent       Refer to Care Plan for [place] [facility]  No goals  set due to pts high level of ADL/IADL performance  Recommendations for other services: None    Skilled Therapeutic Intervention Skilled OT session completed with focus on initial evaluation, education on OT role/POC, and establishment of patient-centered goals.   Pt greeted in bed and agreeable to participate in tx. Pt stating that she feels she is at an independent level and has recovered very quickly from her CVAs. Pt engaged in real and simulated ADL/IADL activity during session. Please see caretool for details regarding ADLs. Pt able to strip linen from bed, ambulate down hallway, and carry linen back to the room to make up the bed with clean linen including fitted sheet. Pt ambulating at independent level without AD, no s/s dizziness, nausea, or fatigue, able to meet task demands unassisted and without LOBs, conversing with OT about PLOF and hobbies at home. Note pt often moving gaze to assess surroundings, no LOBs or vestibular symptoms. She ambulated to the ADL apartment and practiced tub shower transfers as she has a tub shower at home. Pt able to engage in simulated bathing tasks while standing without LOB, using the tub ledge for washing feet. She stated that she plans to independently purchase a TTB to increase her safety during bathing. Pt returned to room and we candidly discussed her high level of functional performance. Pt adamant that she wishes to d/c home tomorrow and does not feel that skilled therapy is appropriate. This OT was in agreement and relayed this to interprofessional team. Pt remained sitting up in the room at close of session.  ADL ADL Eating: Independent Grooming: Independent Where Assessed-Grooming: Standing at sink Upper Body Bathing: Independent Where Assessed-Upper Body Bathing: Shower Lower Body Bathing: Independent;Modified independent (simulated in both tub shower + walk-in shower in pts room) Where Assessed-Lower  Body Bathing: Shower Upper Body Dressing:  Independent Where Assessed-Upper Body Dressing: Chair Lower Body Dressing: Independent Where Assessed-Lower Body Dressing: Edge of bed;Chair Toileting: Independent Where Assessed-Toileting: Glass blower/designer: Programmer, applications Method: Human resources officer: Independent;Modified independent (able to transfer with and without TTB) Walk-In Shower Transfer: IT consultant Method: Heritage manager: Transfer tub bench;Grab bars   Discharge Criteria: Patient will be discharged from OT if patient refuses treatment 3 consecutive times without medical reason, if treatment goals not met, if there is a change in medical status, if patient makes no progress towards goals or if patient is discharged from hospital.  The above assessment, treatment plan, treatment alternatives and goals were discussed and mutually agreed upon: by patient  Skeet Simmer 05/24/2020, 4:09 PM

## 2020-05-24 NOTE — Evaluation (Signed)
Physical Therapy Assessment and Plan  Patient Details  Name: Jeanette Barnes MRN: [id number] Date of Birth: [birth year]  PT Diagnosis: Muscle weakness and Pain in R groin Rehab Potential: Excellent, no rehabilitation indicated at this time as pt completing household level functional mobility independently ELOS:  0 days   Today's Date: 05/24/2020 PT Individual Time: 2518-9842 PT Individual Time Calculation (min): 68 min    Hospital Problem: Principal Problem:   Cerebral infarction due to occlusion of left vertebral artery ([place]) Active Problems:   Cerebellar cerebrovascular accident (CVA) without late effect   Past Medical History:  Past Medical History:  Diagnosis Date  . AAT (alpha-1-antitrypsin) deficiency ([place])   . Anxiety   . Heart murmur    mitral valve prolapse  . Hypercholesteremia   . Hypertension   . Pulmonary nodule   . Vitamin D deficiency    Past Surgical History:  Past Surgical History:  Procedure Laterality Date  . ABDOMINAL HYSTERECTOMY    . BREAST BIOPSY    . BREAST EXCISIONAL BIOPSY Bilateral 1990's   2 Left, 1 Right, all benign  . IR ANGIO EXTERNAL CAROTID SEL EXT CAROTID BILAT MOD SED  05/21/2020  . IR ANGIO VERTEBRAL SEL SUBCLAVIAN INNOMINATE UNI L MOD SED  05/21/2020  . IR ANGIO VERTEBRAL SEL VERTEBRAL UNI R MOD SED  05/21/2020  . IR US GUIDE VASC ACCESS RIGHT  05/21/2020    Assessment & Plan Clinical Impression: Patient is a 65 y.o.  right-handed female with history of hypertension, hyperlipidemia and mitral valve prolapse.  History taken from chart review and patient.  Patient lives alone.  Reportedly independent prior to admission and active.  1 level townhouse with 2 steps to entry.  She has a daughter in the area.  She presented on 05/18/2020 with dizziness, lethargy, and unstable gait of acute onset.  Patient got up to go to the bathroom had a vomiting episode fell and struck the left side of her head and neck on the trash bin.  Denies LOC. MRI/MRI showed  patchy acute ischemic nonhemorrhagic infarct involving the left cerebellum, cerebellar vermis, right temporal occipital region and left midbrain pons.  MRA of the neck showed occlusion of the left vertebral artery with concern for acute dissection.  MRA of the head partial occlusion of the left vertebral artery at its origin.  Cerebral arteriogram showed occluded left VA at its origin without distal reconstitution.  30% stenosis of proximal right VA and no dissection identified.  Echocardiogram with ejection fraction 60 to 65%, no wall motion abnormalities.  Her admission chemistries were unremarkable except potassium 2.7, glucose 143, troponin negative, urine drug screen negative.  Currently maintained on aspirin and Plavix for CVA prophylaxis x3 months then aspirin alone.  Recommendations for 30-day outpatient cardiac event monitor.  She is tolerating a regular consistency diet.  Therapy evaluations completed and due to patient's dizziness, gait instability patient was admitted for a comprehensive rehab program. Patient transferred to CIR on 05/23/2020 .   Patient is currently independent  with mobility; however, demonstrates slight muscle weakness in L LE.  Prior to hospitalization, patient was independent  with mobility and lived Alone in a Other(Comment) (townhouse).  Home access is 1Stairs to enter.  Patient will benefit from follow-up skilled PT intervention in outpatient setting to maximize safe functional mobility and minimize fall risk with planned discharge from CIR home with intermittent supervision.  Anticipate patient will benefit from follow up OP at discharge.  PT - End of Session Activity Tolerance: Tolerates  30+ min activity without fatigue Endurance Deficit: No PT Assessment Rehab Potential (ACUTE/IP ONLY): Excellent PT Barriers to Discharge: Lack of/limited family support PT Patient demonstrates impairments in the following area(s): Balance;Endurance;Motor PT Transfers Functional  Problem(s): Other (comment) (performs all at independent level) PT Locomotion Functional Problem(s): Stairs (requires supervision for stair navigation) PT Plan PT Intensity:  (N/A) PT Frequency:  (N/A) PT Treatment/Interventions: Ambulation/gait training;Discharge planning;Functional mobility training;Therapeutic Activities;Balance/vestibular training;Community reintegration;Disease management/prevention;Neuromuscular re-education;Patient/family education;Stair training PT Transfers Anticipated Outcome(s): independent PT Locomotion Anticipated Outcome(s): independent at household level PT Recommendation Follow Up Recommendations: Outpatient PT Patient destination: Home Equipment Recommended: None recommended by PT   PT Evaluation Precautions/Restrictions  Precautions Precautions: Fall Restrictions Weight Bearing Restrictions: No Pain Pain Assessment Pain Scale: 0-10 Pain Score: 0-No pain  Reports some slight "discomfort" in R groin from surgical site that causes very slight gait deviations and slower gait speed.  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, 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) Vision/Perception  Vision Wears glasses Tracking/Smooth Pursuits: WFL Saccades: WFL Additional comments: pt denies any visual changes Perception Perception: Within Functional Limits Praxis Praxis: Intact  Cognition   Overall Cognitive Status: Within Functional Limits for tasks assessed Arousal/Alertness: Awake/alert Orientation Level: Oriented X4 Attention: Focused Focused Attention: Appears intact Safety/Judgment: Appears intact Sensation Sensation Light Touch: Appears Intact Hot/Cold: Not tested Proprioception: Appears Intact Stereognosis: Not tested Coordination Gross Motor Movements are Fluid and Coordinated: Yes Coordination and Movement Description: Gross motor WFL Heel Shin Test: impaired coordination on L LE compared to R Motor  Motor Motor: Other (comment) Motor - Skilled Clinical Observations: generalized weakness and deconditioning   Trunk/Postural Assessment  Cervical Assessment Cervical Assessment: Within Functional Limits Thoracic Assessment Thoracic Assessment: Within Functional Limits Lumbar Assessment Lumbar Assessment: Within Functional Limits Postural Control Postural Control: Within Functional Limits  Balance  Balance Balance Assessed: Yes Standardized Balance Assessment Standardized Balance Assessment: Functional Gait Assessment Static Sitting Balance Static Sitting - Balance Support: Feet unsupported Static Sitting - Level of Assistance: 6: Modified independent (Device/Increase time) Dynamic Sitting Balance Dynamic Sitting - Balance Support: Feet unsupported Dynamic Sitting - Level of Assistance: 6: Modified independent (Device/Increase time) Static Standing Balance Static Standing - Level of Assistance: 6: Modified independent (Device/Increase time) Dynamic Standing Balance Dynamic Standing - Balance Support: Right upper extremity supported;Left upper extremity supported Dynamic Standing - Level of Assistance: 6: Modified independent (Device/Increase time);5: Stand by assistance Dynamic Standing - Balance Activities: Other (comment) (stairs) Functional Gait  Assessment Gait assessed : Yes Gait Level Surface: Walks 20 ft in less than 5.5 sec, no assistive  devices, good speed, no evidence for imbalance, normal gait pattern, deviates no more than 6 in outside of the 12 in walkway width. Change in Gait Speed: Able to smoothly change walking speed without loss of balance or gait deviation. Deviate no more than 6 in outside of the 12 in walkway width. Gait with Horizontal Head Turns: Performs head turns smoothly with no change in gait. Deviates no more than 6 in outside 12 in walkway width Gait  with Vertical Head Turns: Performs task with slight change in gait velocity (eg, minor disruption to smooth gait path), deviates 6 - 10 in outside 12 in walkway width or uses assistive device (more imbalanced compared to horizontal head turns) Gait and Pivot Turn: Pivot turns safely in greater than 3 sec and stops with no loss of balance, or pivot turns safely within 3 sec and stops with mild imbalance, requires small steps to catch balance. (turned more slowly and with increased number of steps) Step Over Obstacle: Is able to step over one shoe box (4.5 in total height) without changing gait speed. No evidence of imbalance. (slower gait speed overall) Gait with Narrow Base of Support: Ambulates 4-7 steps. (required assistance to maintain balance) Gait with Eyes Closed: Walks 20 ft, uses assistive device, slower speed, mild gait deviations, deviates 6-10 in outside 12 in walkway width. Ambulates 20 ft in less than 9 sec but greater than 7 sec. Ambulating Backwards: Walks 20 ft, no assistive devices, good speed, no evidence for imbalance, normal gait Steps: Alternating feet, must use rail. Total Score: 23 Extremity Assessment      RLE Assessment RLE Assessment: Within Functional Limits General Strength Comments: Grossly 4+/5 to 5/5 throughout LLE Assessment LLE Assessment: Exceptions to [facility] Active Range of Motion (AROM) Comments: WFL/WNL General Strength Comments: assessed in sitting LLE Strength Left Hip Flexion: 4-/5 Left Knee Flexion: 4/5 Left Knee  Extension: 4+/5 Left Ankle Dorsiflexion: 4+/5 Left Ankle Plantar Flexion: 4+/5  Care Tool Care Tool Bed Mobility Roll left and right activity   Roll left and right assist level: Independent    Sit to lying activity   Sit to lying assist level: Independent    Lying to sitting edge of bed activity   Lying to sitting edge of bed assist level: Independent     Care Tool Transfers Sit to stand transfer   Sit to stand assist level: Independent    Chair/bed transfer   Chair/bed transfer assist level: Information systems manager transfer assist level: Supervision/Verbal cueing      Care Tool Locomotion Ambulation   Assist level: Supervision/Verbal cueing Assistive device: No Device Max distance: 344f  Walk 10 feet activity   Assist level: Independent Assistive device: No Device   Walk 50 feet with 2 turns activity   Assist level: Independent Assistive device: No Device  Walk 150 feet activity   Assist level: Supervision/Verbal cueing Assistive device: No Device  Walk 10 feet on uneven surfaces activity   Assist level: Supervision/Verbal cueing    Stairs   Assist level: Contact Guard/Touching assist Stairs assistive device: 2 hand rails Max number of stairs: 12  Walk up/down 1 step activity   Walk up/down 1 step (curb) assist level: Contact Guard/Touching assist Walk up/down 1 step or curb assistive device: 1 hand rail    Walk up/down 4 steps activity Walk up/down 4 steps assist level: Contact Guard/Touching assist Walk up/down 4 steps assistive device: 1 hand rail  Walk up/down 12 steps activity   Walk up/down 12 steps assist level: Contact Guard/Touching assist Walk up/down 12 steps assistive device: 1 hand rail  Pick up small objects from floor   Pick up small object from the floor assist level: Contact Guard/Touching assist    Wheelchair Will patient use wheelchair at discharge?: No          Wheel 50 feet with 2 turns  activity  Wheel 150 feet activity       No Long Term Goals set as patient not requiring CIR level rehabilitation  SHORT TERM GOAL WEEK 1 PT Short Term Goal 1 (Week 1): N/A  Recommendations for other services: None   Skilled Therapeutic Intervention Patient received supine in bed and agreeable to therapy session. Evaluation completed (see details above) with patient education regarding purpose of PT evaluation, PT POC, therapy schedule, weekly team meetings, and other CIR information including safety plan and fall risk safety. Patient performs the below mobility tasks with the specified level of assistance. Throughout mobility tasks pt denies any vestibular symptoms including dizziness, visual disturbances, nausea, or unsteadiness.  Patient demonstrates being independent with functional mobility at household level. Upon discussion with patient regarding D/C planning pt reports having no questions/concerns regarding discharge home and feels that she is safe to D/C tomorrow. Discussed recommendation for follow-up OPPT to progress patient strength, endurance, and balance for higher level community reintegration and pt in agreement. At end of session pt left supine in bed with needs in reach.  Following OT evaluation and assessment confirmed that pt at an independent household level and safe to D/C home not requiring further CIR level therapies. Interdisciplinary team aware.   Mobility Bed Mobility Bed Mobility: Supine to Sit;Sit to Supine Supine to Sit: Independent Sit to Supine: Independent Transfers Transfers: Sit to Stand;Stand to Sit;Stand Pivot Transfers Sit to Stand: Independent Stand to Sit: Independent Stand Pivot Transfers: Independent Stand Pivot Transfer Details: Verbal cues for technique;Verbal cues for precautions/safety Transfer (Assistive device): None Locomotion  Gait Ambulation: Yes Gait Assistance: Independent;Supervision/Verbal cueing (started with supervision for  patient safety quickly progressed to independent) Gait Distance (Feet): 200 Feet Assistive device: None Gait Assistance Details: Verbal cues for gait pattern;Verbal cues for precautions/safety Gait Gait: Yes Gait Pattern: Step-through pattern Gait velocity: decreased (contributed to by R groin incision/discomfort) Stairs / Additional Locomotion Stairs: Yes Stairs Assistance: Contact Guard/Touching assist Stair Management Technique: Two rails Number of Stairs: 12 Height of Stairs: 6 Ramp: Supervision/Verbal cueing Curb: Supervision/Verbal cueing Wheelchair Mobility Wheelchair Mobility: No   Discharge Criteria: Patient will be discharged from PT if patient refuses treatment 3 consecutive times without medical reason, if treatment goals not met, if there is a change in medical status, if patient makes no progress towards goals or if patient is discharged from hospital.  The above assessment, treatment plan, and discharge recommendations were discussed and mutually agreed upon: by patient  Tawana Scale , PT, DPT, CSRS  05/24/2020, 8:00 AM

## 2020-05-24 NOTE — Progress Notes (Addendum)
[facility] PHYSICAL MEDICINE & REHABILITATION PROGRESS NOTE  Subjective/Complaints: Patient seen laying in bed this morning.  She states she slept well overnight.  Discussed with therapies, patient has made significant gains and is independent, ready for discharge.  She had a BM yesterday.  ROS: Denies CP, SOB, N/V/D  Objective: Vital Signs: Blood pressure 124/67, pulse 74, temperature 98.5 F (36.9 C), resp. rate 18, SpO2 100 %. No results found. Recent Labs    05/23/20 0313  WBC 7.1  HGB 11.3*  HCT 33.1*  PLT 177   Recent Labs    05/23/20 0313  NA 138  K 3.7  CL 103  CO2 26  GLUCOSE 114*  BUN 12  CREATININE 0.81  CALCIUM 9.2    Intake/Output Summary (Last 24 hours) at 05/24/2020 1034 Last data filed at 05/24/2020 0815 Gross per 24 hour  Intake 480 ml  Output --  Net 480 ml        Physical Exam: BP 124/67 (BP Location: Left Arm)   Pulse 74   Temp 98.5 F (36.9 C)   Resp 18   SpO2 100%  Constitutional: No distress . Vital signs reviewed. HENT: Normocephalic.  Atraumatic. Eyes: EOMI. No discharge. Cardiovascular: No JVD.  RRR. Respiratory: Normal effort.  No stridor.  Bilateral clear to auscultation. GI: Non-distended.  BS + Skin: Warm and dry.  Intact. Psych: Normal mood.  Normal behavior. Musc: No edema in extremities.  No tenderness in extremities. Neuro: Alert Motor: Bilateral upper extremities: 5/5 proximal distal, unchanged Right lower extremity: 5/5 proximal distal Left lower extremity: 4+-5/5 proximal to distal, improving  Assessment/Plan: 1. Functional deficits which require 3+ hours per day of interdisciplinary therapy in a comprehensive inpatient rehab setting.  Physiatrist is providing close team supervision and 24 hour management of active medical problems listed below.  Physiatrist and rehab team continue to assess barriers to discharge/monitor patient progress toward functional and medical goals   Care Tool:  Bathing               Bathing assist       Upper Body Dressing/Undressing Upper body dressing        Upper body assist      Lower Body Dressing/Undressing Lower body dressing            Lower body assist       Toileting Toileting    Toileting assist       Transfers Chair/bed transfer  Transfers assist           Locomotion Ambulation   Ambulation assist              Walk 10 feet activity   Assist           Walk 50 feet activity   Assist           Walk 150 feet activity   Assist           Walk 10 feet on uneven surface  activity   Assist           Wheelchair     Assist               Wheelchair 50 feet with 2 turns activity    Assist            Wheelchair 150 feet activity     Assist           Medical Problem List and Plan: 1.  Dizziness with gait instability secondary to left  cerebellum, cerebellar vermis, right temporal parietal occipital and left midbrain pons infarct likely due to occlusion of left vertebral artery.  Plan 30-day cardiac event monitor as outpatient  DC today  Patient to follow-up with MD for hospital follow-up in 1 month post-discharge 2.  Antithrombotics: -DVT/anticoagulation: SCDs             -antiplatelet therapy: Aspirin 81 mg daily and Plavix 75 mg day x3 months and aspirin alone 3. Pain Management: Tylenol as needed 4. Mood: Provide emotional support             -antipsychotic agents: N/A 5. Neuropsych: This patient is capable of making decisions on her own behalf. 6. Skin/Wound Care: Routine skin checks 7. Fluids/Electrolytes/Nutrition: Routine in and outs 8.  Hypertension.  HCTZ 25 mg daily to be resumed on 3/14-discussed with pharmacy and patient             Monitor with increased mobility 9.  Hyperlipidemia: Lipitor 10. History of mitral valve prolapse.  Follow-up outpatient 11.  Acute blood loss anemia             Hemoglobin 11.3 on 3/11, follow-up as outpatient  LOS:  1 days A FACE TO FACE EVALUATION WAS PERFORMED  Jeanette Barnes 05/24/2020, 10:34 AM

## 2020-05-24 NOTE — H&P (Signed)
Physical Medicine and Rehabilitation Admission H&P     HPI: Jeanette Barnes is a 65 year old right-handed female with history of hypertension, hyperlipidemia and mitral valve prolapse.  History taken from chart review and patient.  Patient lives alone.  Reportedly independent prior to admission and active.  1 level townhouse with 2 steps to entry.  She has a daughter in the area.  She presented on 05/18/2020 with dizziness, lethargy, and unstable gait of acute onset.  Patient got up to go to the bathroom had a vomiting episode fell and struck the left side of her head and neck on the trash bin.  Denies LOC. MRI/MRI showed patchy acute ischemic nonhemorrhagic infarct involving the left cerebellum, cerebellar vermis, right temporal occipital region and left midbrain pons.  MRA of the neck showed occlusion of the left vertebral artery with concern for acute dissection.  MRA of the head partial occlusion of the left vertebral artery at its origin.  Cerebral arteriogram showed occluded left VA at its origin without distal reconstitution.  30% stenosis of proximal right VA and no dissection identified.  Echocardiogram with ejection fraction 60 to 65%, no wall motion abnormalities.  Her admission chemistries were unremarkable except potassium 2.7, glucose 143, troponin negative, urine drug screen negative.  Currently maintained on aspirin and Plavix for CVA prophylaxis x3 months then aspirin alone.  Recommendations for 30-day outpatient cardiac event monitor.  She is tolerating a regular consistency diet.  Therapy evaluations completed and due to patient's dizziness, gait instability patient was admitted for a comprehensive rehab program.  Please see preadmission assessment from earlier today as well.  Review of Systems  Constitutional: Negative for chills and fever.  HENT: Negative for hearing loss.   Eyes: Negative for blurred vision and double vision.  Respiratory: Negative for cough and shortness of  breath.   Cardiovascular: Negative for chest pain, palpitations and leg swelling.  Gastrointestinal: Positive for constipation. Negative for heartburn.  Genitourinary: Negative for dysuria, flank pain and hematuria.  Musculoskeletal: Positive for falls.  Skin: Negative for rash.  Neurological: Positive for dizziness and focal weakness.  Psychiatric/Behavioral:       Anxiety  All other systems reviewed and are negative.  Past Medical History:  Diagnosis Date  . AAT (alpha-1-antitrypsin) deficiency (HCC)   . Anxiety   . Heart murmur    mitral valve prolapse  . Hypercholesteremia   . Hypertension   . Pulmonary nodule   . Vitamin D deficiency    Past Surgical History:  Procedure Laterality Date  . ABDOMINAL HYSTERECTOMY    . BREAST BIOPSY    . BREAST EXCISIONAL BIOPSY Bilateral 1990's   2 Left, 1 Right, all benign   Family History  Problem Relation Age of Onset  . Emphysema Father   . Hypertension Brother   . Kidney disease Brother    Social History:  reports that she has never smoked. She has never used smokeless tobacco. She reports that she does not drink alcohol and does not use drugs. Allergies: No Known Allergies Medications Prior to Admission  Medication Sig Dispense Refill  . acetaminophen (TYLENOL) 500 MG tablet Take 1,000 mg by mouth every 6 (six) hours as needed for mild pain or headache.    . albuterol (VENTOLIN HFA) 108 (90 Base) MCG/ACT inhaler Inhale 2 puffs into the lungs every 6 (six) hours as needed for wheezing or shortness of breath. 18 g 6  . Biotin 1 MG CAPS Take 1 mg by mouth daily.    Marland Kitchen  cholecalciferol (VITAMIN D) 400 units TABS tablet Take 400 Units by mouth.    . hydrochlorothiazide (HYDRODIURIL) 25 MG tablet Take 25 mg by mouth daily.    . naproxen sodium (ALEVE) 220 MG tablet Take 220 mg by mouth daily as needed (pain/headace).    Marland Kitchen OVER THE COUNTER MEDICATION Take 1 tablet by mouth daily. Stress relief gummy---olly brand    . vitamin B-12  (CYANOCOBALAMIN) 1000 MCG tablet Take 1,000 mcg by mouth daily.    Marland Kitchen VITAMIN E PO Take 1 tablet by mouth as needed.      Drug Regimen Review Drug regimen was reviewed and remains appropriate with no significant issues identified  Home: Home Living Family/patient expects to be discharged to:: Private residence Living Arrangements: Alone Available Help at Discharge: Available PRN/intermittently Type of Home: Other(Comment) (townhouse) Home Access: Stairs to enter [employer] of Steps: 2 Entrance Stairs-Rails: None Home Layout: One level Bathroom Shower/Tub: Tub/shower unit,Curtain Firefighter: Standard Bathroom Accessibility: Yes Home Equipment: None  Lives With: Alone   Functional History: Prior Function Level of Independence: Independent Comments: driving, 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  Pt needs RW for stability at present time. Ambulation/Gait Ambulation/Gait assistance: +2 safety/equipment,Min assist,Mod assist Gait Distance (Feet): 200 Feet Assistive device: Rolling walker (2 wheeled),None Gait Pattern/deviations: Step-through pattern,Decreased stride length,Staggering right,Staggering left General Gait Details: Pt was able to walk on unit with min assist and RW and chair follow for safety.  discussed compensation by decr head  turns as this incr vertigo as pt with suspected vestibular hypofunction.  Pt needed mod assist without device as pt does get unsteady without UE support.  HR from 102-120 bpm with other VSS. Pt experienced imbalance when turning head in both directions. Also noted that if pt looked around she was off balance with RW in use.  Trial of walk without RW and pt mod assist with challenges to balance. Gait velocity interpretation: <1.31 ft/sec, indicative of household ambulator    ADL: ADL Overall ADL's : Needs assistance/impaired Eating/Feeding: Set up,Sitting Eating/Feeding Details (indicate cue type and reason): recliner, with increased time Grooming: Wash/dry hands,Oral care,Minimal assistance,Standing Grooming Details (indicate cue type and reason): Pt stood at sink to groom. pt required assist with balance wtih one LOB to the left with min assist to recover. Pt reports feeling dizzy when she looks down. Upper Body Bathing: Minimal assistance,Sitting Upper Body Bathing Details (indicate cue type and reason): recliner, with increased time Lower Body Bathing: Maximal assistance Lower Body Bathing Details (indicate cue type and reason): Mod A +2 sit<>stand Upper Body Dressing : Minimal assistance,Sitting Upper Body Dressing Details (indicate cue type and reason): pt donned gown at EOB with min assist to tie gown. Lower Body Dressing: Minimal assistance,Sit to/from stand,Cueing for compensatory techniques Lower Body Dressing Details (indicate cue type and reason): Pt required min assist to come to standing and required min assist to maintain balance in standing while managng clothing. Toilet Transfer: Moderate assistance,Ambulation  Toilet Transfer Details (indicate cue type and reason): Pt walked to bathroom with mod assist (min assist execept one LOB requiring mod assist to recover). Toileting- Clothing Manipulation and Hygiene: Min guard,Sitting/lateral lean Toileting - Clothing Manipulation  Details (indicate cue type and reason): +1 assist to stand to clean self.  Otherwise cleaned self in sitting with min guard. Functional mobility during ADLs: Minimal assistance General ADL Comments: Pt doing better wtih adls this am but continues to require min assist due to decreased balance, L side incoordination and mild impulsivity.  Cognition: Cognition Overall Cognitive Status: Within Functional Limits for tasks assessed Arousal/Alertness: Awake/alert Orientation Level: Oriented X4 Attention: Sustained Sustained Attention: Impaired Sustained Attention Impairment: Verbal basic Memory: Appears intact Awareness: Appears intact Cognition Arousal/Alertness: Awake/alert Behavior During Therapy: WFL for tasks assessed/performed Overall Cognitive Status: Within Functional Limits for tasks assessed Area of Impairment: Safety/judgement,Awareness,Problem solving Safety/Judgement: Decreased awareness of safety,Decreased awareness of deficits Awareness: Emergent Problem Solving: Requires verbal cues,Requires tactile cues General Comments: Pt(compared to notes from yesterday) appears to answer questions more quickly and more sure of answeres.  Physical Exam: Blood pressure 136/69, pulse 72, temperature 98.2 F (36.8 C), temperature source Oral, resp. rate 18, weight 62.1 kg, SpO2 98 %. Physical Exam Vitals reviewed.  Constitutional:      General: She is not in acute distress.    Appearance: She is normal weight.  HENT:     Head: Normocephalic and atraumatic.     Right Ear: External ear normal.     Left Ear: External ear normal.     Nose: Nose normal.  Eyes:     General:        Right eye: No discharge.        Left eye: No discharge.     Extraocular Movements: Extraocular movements intact.  Cardiovascular:     Rate and Rhythm: Normal rate and regular rhythm.  Pulmonary:     Effort: Pulmonary effort is normal. No respiratory distress.     Breath sounds: No stridor.  Abdominal:      General: Abdomen is flat. There is distension.     Comments: Hypoactive bowel sounds  Musculoskeletal:     Cervical back: Normal range of motion and neck supple.     Comments: No edema or tenderness in extremities  Skin:    General: Skin is warm and dry.  Neurological:     Mental Status: She is alert.     Comments: Alert and oriented Makes eye contact with examiner as entering room.  Follows simple commands.   Motor: Bilateral upper extremities: 5/5 proximal distal Right lower extremity: 5/5 proximal distal Left lower extremity: 4+-5/5 proximal to distal Sensation intact light touch No ataxia bilateral upper extremities  Psychiatric:        Mood and Affect: Mood normal.        Behavior: Behavior normal.        Thought Content: Thought content normal.     Results for orders placed or performed during the hospital encounter of 05/18/20 (from the past 48 hour(s))  Protime-INR     Status: None   Collection Time: 05/21/20  7:52 AM  Result Value Ref Range   Prothrombin Time 13.3 11.4 - 15.2 seconds   INR 1.1 0.8 - 1.2    Comment: (NOTE) INR goal varies based on device and disease states. Performed at [facility], [address] [place], [place] [address]   Basic metabolic panel     Status: Abnormal  Collection Time: 05/23/20  3:13 AM  Result Value Ref Range   Sodium 138 135 - 145 mmol/L   Potassium 3.7 3.5 - 5.1 mmol/L   Chloride 103 98 - 111 mmol/L   CO2 26 22 - 32 mmol/L   Glucose, Bld 114 (H) 70 - 99 mg/dL    Comment: Glucose reference range applies only to samples taken after fasting for at least 8 hours.   BUN 12 8 - 23 mg/dL   Creatinine, Ser 7.94 0.44 - 1.00 mg/dL   Calcium 9.2 8.9 - 80.1 mg/dL   GFR, Estimated >65 >53 mL/min    Comment: (NOTE) Calculated using the CKD-EPI Creatinine Equation (2021)    Anion gap 9 5 - 15    Comment: Performed at [facility], [address] [place], [place] [address]  CBC     Status: Abnormal   Collection Time:  05/23/20  3:13 AM  Result Value Ref Range   WBC 7.1 4.0 - 10.5 K/uL   RBC 3.74 (L) 3.87 - 5.11 MIL/uL   Hemoglobin 11.3 (L) 12.0 - 15.0 g/dL   HCT 07.8 (L) 67.5 - 44.9 %   MCV 88.5 80.0 - 100.0 fL   MCH 30.2 26.0 - 34.0 pg   MCHC 34.1 30.0 - 36.0 g/dL   RDW 20.1 00.7 - 12.1 %   Platelets 177 150 - 400 K/uL   nRBC 0.0 0.0 - 0.2 %    Comment: Performed at [facility], [address] [place], [place] [address]   No results found.     Medical Problem List and Plan: 1.  Dizziness with gait instability secondary to left cerebellum, cerebellar vermis, right temporal parietal occipital and left midbrain pons infarct likely due to occlusion of left vertebral artery.  Plan 30-day cardiac event monitor as outpatient  -patient may shower  -ELOS/Goals: 8-12 days/supervision/mod I  Admit to CIR 2.  Antithrombotics: -DVT/anticoagulation: SCDs  -antiplatelet therapy: Aspirin 81 mg daily and Plavix 75 mg day x3 months and aspirin alone 3. Pain Management: Tylenol as needed 4. Mood: Provide emotional support  -antipsychotic agents: N/A 5. Neuropsych: This patient is capable of making decisions on her own behalf. 6. Skin/Wound Care: Routine skin checks 7. Fluids/Electrolytes/Nutrition: Routine in and outs  CMP ordered 8.  Hypertension.  HCTZ 25 mg daily  Monitor with increased mobility  9.  Hyperlipidemia: Lipitor 10. History of mitral valve prolapse.  Follow-up outpatient 11.  Acute blood loss anemia  Hemoglobin 11.3 on 3/11  CBC ordered  Charlton Amor, PA-C 05/23/2020  I have personally performed a face to face diagnostic evaluation, including, but not limited to relevant history and physical exam findings, of this patient and developed relevant assessment and plan.  Additionally, I have reviewed and concur with the physician assistant's documentation above.  Maryla Morrow, MD, ABPMR  The patient's status has not changed. Any changes from the pre-admission screening or  documentation from the acute chart are noted above.   Maryla Morrow, MD, ABPMR  Late entry from 05/23/2020 due to network issues

## 2020-05-25 DIAGNOSIS — D62 Acute posthemorrhagic anemia: Secondary | ICD-10-CM | POA: Diagnosis not present

## 2020-05-25 DIAGNOSIS — I63212 Cerebral infarction due to unspecified occlusion or stenosis of left vertebral arteries: Secondary | ICD-10-CM | POA: Diagnosis not present

## 2020-05-25 DIAGNOSIS — I10 Essential (primary) hypertension: Secondary | ICD-10-CM | POA: Diagnosis not present

## 2020-05-25 NOTE — IPOC Note (Signed)
Patient discharge at independent level after therapy evaluations.

## 2020-05-25 NOTE — Progress Notes (Signed)
Patient left with family member. Discharge instructions were reviewed by MD with patient.

## 2020-05-26 ENCOUNTER — Telehealth: Payer: Self-pay | Admitting: Cardiology

## 2020-05-26 DIAGNOSIS — I63112 Cerebral infarction due to embolism of left vertebral artery: Secondary | ICD-10-CM

## 2020-05-26 MED ORDER — CLOPIDOGREL BISULFATE 75 MG PO TABS: 75.0000 mg | ORAL_TABLET | Freq: Every day | ORAL | 3 refills | Status: DC

## 2020-05-26 MED ORDER — ATORVASTATIN CALCIUM 80 MG PO TABS: 80.0000 mg | ORAL_TABLET | Freq: Every day | ORAL | 3 refills | Status: DC

## 2020-05-26 NOTE — Discharge Summary (Signed)
Physician Discharge Summary  Patient ID: Jeanette Barnes MRN: [id number] DOB/AGE: 67-Sep-1957 67 y.o.  Admit date: 05/23/2020 Discharge date: 05/25/2020  Discharge Diagnoses:  Principal Problem:   Cerebral infarction due to occlusion of left vertebral artery [facility]) Active Problems:   Cerebellar cerebrovascular accident (CVA) without late effect   Essential hypertension Hyperlipidemia Mitral valve prolapse  Discharged Condition: Stable  Significant Diagnostic Studies: MR ANGIO HEAD WO CONTRAST  Result Date: 05/19/2020 CLINICAL DATA:  Initial evaluation for neuro deficit, stroke suspected, vertigo. EXAM: MRI HEAD WITHOUT CONTRAST MRA HEAD WITHOUT CONTRAST MRA NECK WITHOUT AND WITH CONTRAST TECHNIQUE: Multiplanar, multiecho pulse sequences of the brain and surrounding structures were obtained without intravenous contrast. Angiographic images of the Circle of Willis were obtained using MRA technique without intravenous contrast. Angiographic images of the neck were obtained using MRA technique without and with intravenous contrast. Carotid stenosis measurements (when applicable) are obtained utilizing NASCET criteria, using the distal internal carotid diameter as the denominator. CONTRAST:  6.36mL GADAVIST GADOBUTROL 1 MMOL/ML IV SOLN COMPARISON:  Prior CT from 03/30/2018. FINDINGS: MRI HEAD FINDINGS Brain: Cerebral volume within normal limits for patient age. No significant cerebral white matter disease for age. Patchy multifocal areas of restricted diffusion seen involving the left cerebellum, most pronounced inferiorly. Minimal patchy involvement of the central cerebellar vermis. Additional patchy small volume areas of restricted diffusion seen involving the paramedian right temporal occipital region. Mild diffusion abnormality also noted at the left midbrain/pons. No associated hemorrhage or mass effect. Otherwise, gray-white matter differentiation well maintained. No other areas of chronic cortical  infarction. No foci of susceptibility artifact to suggest acute or chronic intracranial hemorrhage. No mass lesion, midline shift or mass effect. No hydrocephalus or extra-axial fluid collection. Pituitary gland suprasellar region normal. Midline structures intact. Vascular: Major intracranial vascular flow voids are maintained. Skull and upper cervical spine: Craniocervical junction within normal limits. Bone marrow signal intensity normal. No scalp soft tissue abnormality. Reversal of the normal upper cervical lordosis noted. Sinuses/Orbits: Globes and orbital soft tissues demonstrate no acute finding. Paranasal sinuses are clear. No mastoid effusion. Inner ear structures grossly normal. Other: None. MRA HEAD FINDINGS ANTERIOR CIRCULATION: Visualized distal cervical segments are both widely patent with antegrade flow. Petrous, cavernous, and supraclinoid segments patent without stenosis or other abnormality. A1 segments widely patent. Normal anterior communicating artery complex. Anterior cerebral arteries patent to their distal aspects without stenosis. No M1 stenosis or occlusion. Normal MCA bifurcations. Distal MCA branches well perfused and symmetric. POSTERIOR CIRCULATION: Dominant right vertebral artery widely patent to the vertebrobasilar junction. Right PICA patent and well perfused. Faint flow seen within the partially visualized distal left vertebral artery, with relatively absent flow within the left V3 segment. The left vertebral artery appears occluded as it courses into the cranial vault. Attenuated irregular flow seen within the left V4 segment distally, and remains grossly patent to the vertebrobasilar junction. Left PICA grossly patent at its origin. Basilar patent to its distal aspect without stenosis or visible abnormality. Right superior cerebral artery widely patent. Left SCA faintly visible and appears grossly patent on time-of-flight sequence, not seen on MIP reconstructions. Both PCAs  primarily supplied via the basilar. PCAs appear well perfused to their distal aspects. No intracranial aneurysm. MRA NECK FINDINGS AORTIC ARCH: Visualized aortic arch normal in caliber with normal 3 vessel morphology. No hemodynamically significant stenosis seen about the origin of the great vessels. RIGHT CAROTID SYSTEM: Right CCA patent from its origin to the bifurcation without stenosis. Mild for age atheromatous irregularity about  the right carotid bulb with no more than mild 20-25% stenosis by NASCET criteria. Right ICA patent distally without stenosis, evidence for dissection or occlusion. LEFT CAROTID SYSTEM: Left common carotid artery widely patent from its origin to the bifurcation. Mild for age atheromatous irregularity about the left carotid bulb with no more than mild 20-25% by NASCET criteria. Left ICA otherwise patent without stenosis, evidence for dissection, or occlusion. Minimal irregularity about the mid cervical left ICA noted. VERTEBRAL ARTERIES: Both vertebral arteries arise from the subclavian arteries. Right vertebral artery likely dominant. There is a short-segment moderate approximate 50% stenosis involving the pre foraminal right V1 segment (series 1058, image 10). Otherwise, the right vertebral artery is widely patent within the neck without stenosis, evidence for dissection, or occlusion. On the left, the left vertebral artery appears occluded at its origin (series 18, image 32). Distally, the left ICA is markedly attenuated and diminutive, with near occlusion at its distal V2 segment. Some flow is seen within the left V3 segment, but is also markedly attenuated and irregular. Given the presence of acute posterior circulation infarcts, finding raises the possibility for an acute dissection. IMPRESSION: MRI HEAD IMPRESSION: 1. Patchy acute ischemic nonhemorrhagic infarcts involving the left cerebellum, cerebellar vermis, right temporoccipital region, and left midbrain/pons. 2. Otherwise  normal brain MRI for age. MRA HEAD IMPRESSION: 1. Occlusion of the left vertebral artery with concern for acute dissection as below. Attenuated irregular flow within the left V4 segment distally may in part be retrograde in nature. Dominant right vertebral artery widely patent. 2. Poor visualization of the left SCA, which also may be partially occluded given the distribution of infarcts. 3. Widely patent anterior circulation. MRA NECK IMPRESSION: 1. 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 Given the presence of acute posterior circulation infarcts, these findings raise the possibility for an acute arterial dissection. Correlation with dedicated CTA could be performed for further evaluation as warranted. 2. Short-segment moderate approximate 50% stenosis involving the pre foraminal right V1 segment. Otherwise wide patency of the dominant right vertebral artery. 3. Mild for age atheromatous change about the carotid bifurcations without significant stenosis. Otherwise wide patency of both carotid artery systems in the neck. Critical Value/emergent results were called by telephone at the time of interpretation on 05/19/2020 at 1:34 a.m. to provider Dr. Marily Memos, who verbally acknowledged these results. Electronically Signed   By: Rise Mu M.D.   On: 05/19/2020 01:56   MR ANGIO NECK W WO CONTRAST  Result Date: 05/19/2020 CLINICAL DATA:  Initial evaluation for neuro deficit, stroke suspected, vertigo. EXAM: MRI HEAD WITHOUT CONTRAST MRA HEAD WITHOUT CONTRAST MRA NECK WITHOUT AND WITH CONTRAST TECHNIQUE: Multiplanar, multiecho pulse sequences of the brain and surrounding structures were obtained without intravenous contrast. Angiographic images of the Circle of Willis were obtained using MRA technique without intravenous contrast. Angiographic images of the neck were obtained using MRA technique  without and with intravenous contrast. Carotid stenosis measurements (when applicable) are obtained utilizing NASCET criteria, using the distal internal carotid diameter as the denominator. CONTRAST:  6.29mL GADAVIST GADOBUTROL 1 MMOL/ML IV SOLN COMPARISON:  Prior CT from 03/30/2018. FINDINGS: MRI HEAD FINDINGS Brain: Cerebral volume within normal limits for patient age. No significant cerebral white matter disease for age. Patchy multifocal areas of restricted diffusion seen involving the left cerebellum, most pronounced inferiorly. Minimal patchy involvement of the central cerebellar vermis. Additional patchy small volume areas  of restricted diffusion seen involving the paramedian right temporal occipital region. Mild diffusion abnormality also noted at the left midbrain/pons. No associated hemorrhage or mass effect. Otherwise, gray-white matter differentiation well maintained. No other areas of chronic cortical infarction. No foci of susceptibility artifact to suggest acute or chronic intracranial hemorrhage. No mass lesion, midline shift or mass effect. No hydrocephalus or extra-axial fluid collection. Pituitary gland suprasellar region normal. Midline structures intact. Vascular: Major intracranial vascular flow voids are maintained. Skull and upper cervical spine: Craniocervical junction within normal limits. Bone marrow signal intensity normal. No scalp soft tissue abnormality. Reversal of the normal upper cervical lordosis noted. Sinuses/Orbits: Globes and orbital soft tissues demonstrate no acute finding. Paranasal sinuses are clear. No mastoid effusion. Inner ear structures grossly normal. Other: None. MRA HEAD FINDINGS ANTERIOR CIRCULATION: Visualized distal cervical segments are both widely patent with antegrade flow. Petrous, cavernous, and supraclinoid segments patent without stenosis or other abnormality. A1 segments widely patent. Normal anterior communicating artery complex. Anterior cerebral  arteries patent to their distal aspects without stenosis. No M1 stenosis or occlusion. Normal MCA bifurcations. Distal MCA branches well perfused and symmetric. POSTERIOR CIRCULATION: Dominant right vertebral artery widely patent to the vertebrobasilar junction. Right PICA patent and well perfused. Faint flow seen within the partially visualized distal left vertebral artery, with relatively absent flow within the left V3 segment. The left vertebral artery appears occluded as it courses into the cranial vault. Attenuated irregular flow seen within the left V4 segment distally, and remains grossly patent to the vertebrobasilar junction. Left PICA grossly patent at its origin. Basilar patent to its distal aspect without stenosis or visible abnormality. Right superior cerebral artery widely patent. Left SCA faintly visible and appears grossly patent on time-of-flight sequence, not seen on MIP reconstructions. Both PCAs primarily supplied via the basilar. PCAs appear well perfused to their distal aspects. No intracranial aneurysm. MRA NECK FINDINGS AORTIC ARCH: Visualized aortic arch normal in caliber with normal 3 vessel morphology. No hemodynamically significant stenosis seen about the origin of the great vessels. RIGHT CAROTID SYSTEM: Right CCA patent from its origin to the bifurcation without stenosis. Mild for age atheromatous irregularity about the right carotid bulb with no more than mild 20-25% stenosis by NASCET criteria. Right ICA patent distally without stenosis, evidence for dissection or occlusion. LEFT CAROTID SYSTEM: Left common carotid artery widely patent from its origin to the bifurcation. Mild for age atheromatous irregularity about the left carotid bulb with no more than mild 20-25% by NASCET criteria. Left ICA otherwise patent without stenosis, evidence for dissection, or occlusion. Minimal irregularity about the mid cervical left ICA noted. VERTEBRAL ARTERIES: Both vertebral arteries arise from the  subclavian arteries. Right vertebral artery likely dominant. There is a short-segment moderate approximate 50% stenosis involving the pre foraminal right V1 segment (series 1058, image 10). Otherwise, the right vertebral artery is widely patent within the neck without stenosis, evidence for dissection, or occlusion. On the left, the left vertebral artery appears occluded at its origin (series 18, image 32). Distally, the left ICA is markedly attenuated and diminutive, with near occlusion at its distal V2 segment. Some flow is seen within the left V3 segment, but is also markedly attenuated and irregular. Given the presence of acute posterior circulation infarcts, finding raises the possibility for an acute dissection. IMPRESSION: MRI HEAD IMPRESSION: 1. Patchy acute ischemic nonhemorrhagic infarcts involving the left cerebellum, cerebellar vermis, right temporoccipital region, and left midbrain/pons. 2. Otherwise normal brain MRI for age. MRA HEAD IMPRESSION: 1.  Occlusion of the left vertebral artery with concern for acute dissection as below. Attenuated irregular flow within the left V4 segment distally may in part be retrograde in nature. Dominant right vertebral artery widely patent. 2. Poor visualization of the left SCA, which also may be partially occluded given the distribution of infarcts. 3. Widely patent anterior circulation. MRA NECK IMPRESSION: 1. 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 Given the presence of acute posterior circulation infarcts, these findings raise the possibility for an acute arterial dissection. Correlation with dedicated CTA could be performed for further evaluation as warranted. 2. Short-segment moderate approximate 50% stenosis involving the pre foraminal right V1 segment. Otherwise wide patency of the dominant right vertebral artery. 3. Mild for age atheromatous change  about the carotid bifurcations without significant stenosis. Otherwise wide patency of both carotid artery systems in the neck. Critical Value/emergent results were called by telephone at the time of interpretation on 05/19/2020 at 1:34 a.m. to provider Dr. Marily Memos, who verbally acknowledged these results. Electronically Signed   By: Rise Mu M.D.   On: 05/19/2020 01:56   MR BRAIN WO CONTRAST  Result Date: 05/19/2020 CLINICAL DATA:  Initial evaluation for neuro deficit, stroke suspected, vertigo. EXAM: MRI HEAD WITHOUT CONTRAST MRA HEAD WITHOUT CONTRAST MRA NECK WITHOUT AND WITH CONTRAST TECHNIQUE: Multiplanar, multiecho pulse sequences of the brain and surrounding structures were obtained without intravenous contrast. Angiographic images of the Circle of Willis were obtained using MRA technique without intravenous contrast. Angiographic images of the neck were obtained using MRA technique without and with intravenous contrast. Carotid stenosis measurements (when applicable) are obtained utilizing NASCET criteria, using the distal internal carotid diameter as the denominator. CONTRAST:  6.24mL GADAVIST GADOBUTROL 1 MMOL/ML IV SOLN COMPARISON:  Prior CT from 03/30/2018. FINDINGS: MRI HEAD FINDINGS Brain: Cerebral volume within normal limits for patient age. No significant cerebral white matter disease for age. Patchy multifocal areas of restricted diffusion seen involving the left cerebellum, most pronounced inferiorly. Minimal patchy involvement of the central cerebellar vermis. Additional patchy small volume areas of restricted diffusion seen involving the paramedian right temporal occipital region. Mild diffusion abnormality also noted at the left midbrain/pons. No associated hemorrhage or mass effect. Otherwise, gray-white matter differentiation well maintained. No other areas of chronic cortical infarction. No foci of susceptibility artifact to suggest acute or chronic intracranial hemorrhage.  No mass lesion, midline shift or mass effect. No hydrocephalus or extra-axial fluid collection. Pituitary gland suprasellar region normal. Midline structures intact. Vascular: Major intracranial vascular flow voids are maintained. Skull and upper cervical spine: Craniocervical junction within normal limits. Bone marrow signal intensity normal. No scalp soft tissue abnormality. Reversal of the normal upper cervical lordosis noted. Sinuses/Orbits: Globes and orbital soft tissues demonstrate no acute finding. Paranasal sinuses are clear. No mastoid effusion. Inner ear structures grossly normal. Other: None. MRA HEAD FINDINGS ANTERIOR CIRCULATION: Visualized distal cervical segments are both widely patent with antegrade flow. Petrous, cavernous, and supraclinoid segments patent without stenosis or other abnormality. A1 segments widely patent. Normal anterior communicating artery complex. Anterior cerebral arteries patent to their distal aspects without stenosis. No M1 stenosis or occlusion. Normal MCA bifurcations. Distal MCA branches well perfused and symmetric. POSTERIOR CIRCULATION: Dominant right vertebral artery widely patent to the vertebrobasilar junction. Right PICA patent and well perfused. Faint flow seen within the partially visualized distal left vertebral artery, with relatively absent flow within the left V3  segment. The left vertebral artery appears occluded as it courses into the cranial vault. Attenuated irregular flow seen within the left V4 segment distally, and remains grossly patent to the vertebrobasilar junction. Left PICA grossly patent at its origin. Basilar patent to its distal aspect without stenosis or visible abnormality. Right superior cerebral artery widely patent. Left SCA faintly visible and appears grossly patent on time-of-flight sequence, not seen on MIP reconstructions. Both PCAs primarily supplied via the basilar. PCAs appear well perfused to their distal aspects. No intracranial  aneurysm. MRA NECK FINDINGS AORTIC ARCH: Visualized aortic arch normal in caliber with normal 3 vessel morphology. No hemodynamically significant stenosis seen about the origin of the great vessels. RIGHT CAROTID SYSTEM: Right CCA patent from its origin to the bifurcation without stenosis. Mild for age atheromatous irregularity about the right carotid bulb with no more than mild 20-25% stenosis by NASCET criteria. Right ICA patent distally without stenosis, evidence for dissection or occlusion. LEFT CAROTID SYSTEM: Left common carotid artery widely patent from its origin to the bifurcation. Mild for age atheromatous irregularity about the left carotid bulb with no more than mild 20-25% by NASCET criteria. Left ICA otherwise patent without stenosis, evidence for dissection, or occlusion. Minimal irregularity about the mid cervical left ICA noted. VERTEBRAL ARTERIES: Both vertebral arteries arise from the subclavian arteries. Right vertebral artery likely dominant. There is a short-segment moderate approximate 50% stenosis involving the pre foraminal right V1 segment (series 1058, image 10). Otherwise, the right vertebral artery is widely patent within the neck without stenosis, evidence for dissection, or occlusion. On the left, the left vertebral artery appears occluded at its origin (series 18, image 32). Distally, the left ICA is markedly attenuated and diminutive, with near occlusion at its distal V2 segment. Some flow is seen within the left V3 segment, but is also markedly attenuated and irregular. Given the presence of acute posterior circulation infarcts, finding raises the possibility for an acute dissection. IMPRESSION: MRI HEAD IMPRESSION: 1. Patchy acute ischemic nonhemorrhagic infarcts involving the left cerebellum, cerebellar vermis, right temporoccipital region, and left midbrain/pons. 2. Otherwise normal brain MRI for age. MRA HEAD IMPRESSION: 1. Occlusion of the left vertebral artery with concern for  acute dissection as below. Attenuated irregular flow within the left V4 segment distally may in part be retrograde in nature. Dominant right vertebral artery widely patent. 2. Poor visualization of the left SCA, which also may be partially occluded given the distribution of infarcts. 3. Widely patent anterior circulation. MRA NECK IMPRESSION: 1. 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 Given the presence of acute posterior circulation infarcts, these findings raise the possibility for an acute arterial dissection. Correlation with dedicated CTA could be performed for further evaluation as warranted. 2. Short-segment moderate approximate 50% stenosis involving the pre foraminal right V1 segment. Otherwise wide patency of the dominant right vertebral artery. 3. Mild for age atheromatous change about the carotid bifurcations without significant stenosis. Otherwise wide patency of both carotid artery systems in the neck. Critical Value/emergent results were called by telephone at the time of interpretation on 05/19/2020 at 1:34 a.m. to provider Dr. Marily Memos, who verbally acknowledged these results. Electronically Signed   By: Rise Mu M.D.   On: 05/19/2020 01:56   IR US Guide Vasc Access Right  Result Date: 05/23/2020 CLINICAL DATA:  Posterior fossa ischemic strokes. Occlusion, dissection of left vertebral artery on MRA of  the head and neck. EXAM: IR ANGIO EXTERNAL CAROTID SEL EXT CAROTID BILAT MOD SED COMPARISON:  MRI MRA of the brain of May 19, 2020. MEDICATIONS: Heparin 2000 units intra-arterially. No antibiotic was administered within 1 hour of the procedure. ANESTHESIA/SEDATION: Versed 1 mg IV; Fentanyl 25 mcg IV Moderate Sedation Time:  69 minutes The patient was continuously monitored during the procedure by the interventional radiology nurse under my direct supervision. CONTRAST:   Isovue 300 approximately 100 mL FLUOROSCOPY TIME:  Fluoroscopy Time: 23 minutes 18 seconds (857 mGy). COMPLICATIONS: None immediate. TECHNIQUE: Informed written consent was obtained from the patient after a thorough discussion of the procedural risks, benefits and alternatives. All questions were addressed. Maximal Sterile Barrier Technique was utilized including caps, mask, sterile gowns, sterile gloves, sterile drape, hand hygiene and skin antiseptic. A timeout was performed prior to the initiation of the procedure. The right forearm to the wrist was prepped and draped in the usual sterile manner. The right radial artery was then identified with ultrasound, and its morphology documented. A dorsal palmar anastomosis was verified to be present. Using ultrasound guidance, radial access was obtained with a micropuncture set. Over a 0.018 inch micro guidewire, a 4/5 French radial sheath was inserted. The obturator, and the micro guidewire were removed. Good aspiration obtained from the side port of the sheath. A cocktail of 2000 units of heparin, and 200 mcg of nitroglycerin was then infused in diluted form through the sheath without event. A right radial arteriogram was performed. Over a 0.035 inch Roadrunner guidewire, a 5 [place] Simmons 2 diagnostic catheter was advanced to the aortic arch region, and selectively positioned in the right vertebral artery, the right common carotid artery. However, access to the left common carotid artery and the left subclavian artery could not be obtained via the radial approach due to the aortic arch anatomy. A wrist band was then applied at the right radial puncture site for hemostasis. The right radial pulse was verified to be present. The right common femoral artery approach was then utilized. Over a 0.035 inch guidewire, a 5 French Pinnacle sheath was inserted. Through this and also over a 0.035 inch guidewire, a 5 [place] JB 1 catheter was advanced to the aortic arch region and  selectively positioned in the left common carotid artery and the left subclavian artery. FINDINGS: The right vertebral artery origin is widely patent. There is a 30% stenosis of the proximal right vertebral artery secondary to a smooth circumferential plaque. More distally the vessel is seen to opacify to the cranial skull base. Patency is seen of the right vertebrobasilar junction and the right posterior-inferior cerebellar artery. The basilar artery, the posterior cerebral arteries, the superior cerebellar arteries and the anterior-inferior cerebellar arteries opacify into the capillary and venous phases. Flash filling of the left vertebrobasilar junction from the right vertebral artery injection is noted. The right common carotid arteriogram demonstrates the origin of the right external carotid artery and its major branches to be widely patent. The right internal carotid artery at the bulb has a smooth shallow atherosclerotic plaque without evidence of ulcerations or of intraluminal filling defects. The vessel is seen to opacify to the cranial skull base with mild FMD- like changes in the mid cervical segment. Distally the petrous, cavernous and supraclinoid segments are widely patent. Flash filling of the right posterior communicating artery and the right posterior cerebral artery is seen. The right middle cerebral artery and the right anterior cerebral artery opacify into the capillary and venous phases.  The left common carotid arteriogram demonstrates the left external carotid artery and its major branches to be widely patent. The left internal carotid artery at the bulb and its proximal 1/3 is widely patent. The mid 1/3 segment of the left internal carotid artery demonstrates focal areas of smooth outpouching without intraluminal narrowing. Two focal outpouchings are seen arising at this site. The proximal one measures approximately 1.8 mm, whilst the distal one measures approximately 3.4 mm x 3.4 mm. These  probably represent pseudoaneurysms related to fibromuscular dysplasia. Distal to this the distal cervical, the petrous cavernous and the supraclinoid left ICA are widely patent. The left middle cerebral artery and the left anterior cerebral artery opacify into the capillary and venous phases. Again noted is flash filling of the left posterior cerebral artery via the left posterior communicating artery. A left subclavian arteriogram demonstrates of the origin of the left vertebral artery with a very fine string sign. There is contrast noted just distal to this. More distally the left vertebral artery is non-opacified. There is suggestion of delayed opacification of the of the left vertebrobasilar junction from muscular collaterals arising from the ascending cervical branch of the of the left thyrocervical trunk. IMPRESSION: Approximately 30% stenosis of the proximal right vertebral artery secondary to a smooth circumferential soft atherosclerotic plaque. 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 See discussion above. PLAN: Findings reviewed with the patient and referring neurologist. Follow-up CT angiogram of the head and neck in 6 months time. Electronically Signed   By: Julieanne CottonSanjeev  Deveshwar M.D.   On: 05/22/2020 08:34   DG Chest Portable 1 View  Result Date: 05/18/2020 CLINICAL DATA:  Dizziness for several hours EXAM: PORTABLE CHEST 1 VIEW COMPARISON:  12/26/2019 CT FINDINGS: The heart size and mediastinal contours are within normal limits. Both lungs are  clear. The visualized skeletal structures are unremarkable. IMPRESSION: No active disease. Electronically Signed   By: Alcide CleverMark  Lukens M.D.   On: 05/18/2020 22:22   ECHOCARDIOGRAM COMPLETE  Result Date: 05/19/2020    ECHOCARDIOGRAM REPORT   Patient Name:   Kerrin MoHEDORA Sakamoto Date of Exam: 05/19/2020 Medical Rec #:  [id number]      Height:       63.5 in Accession #:    [id number]     Weight:       136.6 lb Date of Birth:  09-26-1955      BSA:          1.654 m Patient Age:    65 years       BP:           129/53 mmHg Patient Gender: F              HR:           63 bpm. Exam Location:  Inpatient Procedure: 2D Echo Indications:    stroke  History:        Patient has no prior history of Echocardiogram examinations.                 Risk Factors:Hypertension.  Sonographer:    Delcie RochLauren Pennington Referring Phys: [id number] VASUNDHRA RATHORE  IMPRESSIONS  1. Left ventricular ejection fraction, by estimation, is 60 to 65%. The left ventricle has normal function. The left ventricle has no regional wall motion abnormalities. There is mild concentric left ventricular hypertrophy with more notable, discrete thickening of the basal-septal segment. Left ventricular diastolic parameters are consistent with Grade I diastolic dysfunction (impaired relaxation).  2. Right ventricular systolic function is normal. The right ventricular size is mildly enlarged. There is normal pulmonary artery systolic pressure. The estimated right ventricular systolic pressure is 26.6 mmHg.  3. The mitral valve is grossly normal. Trivial mitral valve regurgitation.  4. The aortic valve is tricuspid. Aortic valve regurgitation is not visualized. No aortic stenosis is present.  5. The inferior vena cava is normal in size with greater than 50% respiratory variability, suggesting right atrial pressure of 3 mmHg. Comparison(s): No prior Echocardiogram. Conclusion(s)/Recommendation(s): No intracardiac source of embolism detected on this transthoracic study. A transesophageal  echocardiogram is recommended to exclude cardiac source of embolism if clinically indicated. FINDINGS  Left Ventricle: Left ventricular ejection fraction, by estimation, is 60 to 65%. The left ventricle has normal function. The left ventricle has no regional wall motion abnormalities. The left ventricular internal cavity size was normal in size. There is  mild concentric left ventricular hypertrophy with more notable, discrete thickening of the basal-septal segment. Left ventricular diastolic parameters are consistent with Grade I diastolic dysfunction (impaired relaxation). Right Ventricle: The right ventricular size is mildly enlarged. No increase in right ventricular wall thickness. Right ventricular systolic function is normal. There is normal pulmonary artery systolic pressure. The tricuspid regurgitant velocity is 2.43  m/s, and with an assumed right atrial pressure of 3 mmHg, the estimated right ventricular systolic pressure is 26.6 mmHg. Left Atrium: Left atrial size was normal in size. Right Atrium: Right atrial size was normal in size. Pericardium: There is no evidence of pericardial effusion. Mitral Valve: The mitral valve is grossly normal. There is mild thickening of the mitral valve leaflet(s). There is mild calcification of the mitral valve leaflet(s). Trivial mitral valve regurgitation. Tricuspid Valve: The tricuspid valve is normal in structure. Tricuspid valve regurgitation is mild. Aortic Valve: The aortic valve is tricuspid. Aortic valve regurgitation is not visualized. No aortic stenosis is present. Pulmonic Valve: The pulmonic valve was not well visualized. Pulmonic valve regurgitation is trivial. Aorta: The aortic root and ascending aorta are structurally normal, with no evidence of dilitation. Venous: The inferior vena cava is normal in size with greater than 50% respiratory variability, suggesting right atrial pressure of 3 mmHg. IAS/Shunts: No atrial level shunt detected by color flow  Doppler.  LEFT VENTRICLE PLAX 2D LVIDd:         4.10 cm Diastology LVIDs:         2.30 cm LV e' medial:    6.74 cm/s LV PW:         0.80 cm LV E/e' medial:  13.5 LV IVS:        0.80 cm LV e' lateral:   9.46 cm/s                        LV E/e' lateral: 9.6  RIGHT VENTRICLE             IVC RV S prime:     20.70 cm/s  IVC diam: 1.40 cm TAPSE (M-mode): 2.5 cm LEFT ATRIUM             Index       RIGHT  ATRIUM          Index LA diam:        3.80 cm 2.30 cm/m  RA Area:     9.67 cm LA Vol (A2C):   29.9 ml 18.08 ml/m RA Volume:   18.90 ml 11.43 ml/m LA Vol (A4C):   27.8 ml 16.81 ml/m LA Biplane Vol: 28.8 ml 17.41 ml/m  AORTIC VALVE LVOT Vmax:   80.80 cm/s LVOT Vmean:  55.900 cm/s LVOT VTI:    0.183 m  AORTA Ao Asc diam: 2.90 cm MITRAL VALVE               TRICUSPID VALVE MV Area (PHT): 3.85 cm    TR Peak grad:   23.6 mmHg MV Decel Time: 197 msec    TR Vmax:        243.00 cm/s MV E velocity: 90.80 cm/s MV A velocity: 98.50 cm/s  SHUNTS MV E/A ratio:  0.92        Systemic VTI: 0.18 m Laurance Flatten MD Electronically signed by Laurance Flatten MD Signature Date/Time: 05/19/2020/3:50:16 PM    Final    IR ANGIO VERTEBRAL SEL SUBCLAVIAN INNOMINATE UNI L MOD SED  Result Date: 05/23/2020 CLINICAL DATA:  Posterior fossa ischemic strokes. 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 EXAM: IR ANGIO EXTERNAL CAROTID SEL EXT CAROTID BILAT MOD SED COMPARISON:  MRI MRA of the brain of May 19, 2020. MEDICATIONS: Heparin 2000 units intra-arterially. No antibiotic was administered within 1 hour of the procedure. ANESTHESIA/SEDATION: Versed 1 mg IV; Fentanyl 25 mcg IV Moderate Sedation Time:  69 minutes The patient was continuously monitored during the procedure by the interventional radiology nurse under my direct supervision. CONTRAST:  Isovue 300 approximately 100 mL FLUOROSCOPY TIME:  Fluoroscopy Time: 23 minutes 18 seconds (857 mGy). COMPLICATIONS: None immediate. TECHNIQUE: Informed written  consent was obtained from the patient after a thorough discussion of the procedural risks, benefits and alternatives. All questions were addressed. Maximal Sterile Barrier Technique was utilized including caps, mask, sterile gowns, sterile gloves, sterile drape, hand hygiene and skin antiseptic. A timeout was performed prior to the initiation of the procedure. The right forearm to the wrist was prepped and draped in the usual sterile manner. The right radial artery was then identified with ultrasound, and its morphology documented. A dorsal palmar anastomosis was verified to be present. Using ultrasound guidance, radial access was obtained with a micropuncture set. Over a 0.018 inch micro guidewire, a 4/5 French radial sheath was inserted. The obturator, and the micro guidewire were removed. Good aspiration obtained from the side port of the sheath. A cocktail of 2000 units of heparin, and 200 mcg of nitroglycerin was then infused in diluted form through the sheath without event. A right radial arteriogram was performed. Over a 0.035 inch Roadrunner guidewire, a 5 [place] Simmons 2 diagnostic catheter was advanced to the aortic arch region, and selectively positioned in the right vertebral artery, the right common carotid artery. However, access to the left common carotid artery and the left subclavian artery could not be obtained via the radial approach due to the aortic arch anatomy. A wrist band was then applied at the right radial puncture site for hemostasis. The right radial pulse was verified to be present. The right common femoral artery approach was then utilized. Over a 0.035 inch guidewire, a 5 French Pinnacle sheath was inserted. Through this and also over a 0.035 inch guidewire, a 5 [place] JB 1  catheter was advanced to the aortic arch region and selectively positioned in the left common carotid artery and the left subclavian artery. FINDINGS: The right vertebral artery origin is widely patent. There is a  30% stenosis of the proximal right vertebral artery secondary to a smooth circumferential plaque. More distally the vessel is seen to opacify to the cranial skull base. Patency is seen of the right vertebrobasilar junction and the right posterior-inferior cerebellar artery. The basilar artery, the posterior cerebral arteries, the superior cerebellar arteries and the anterior-inferior cerebellar arteries opacify into the capillary and venous phases. Flash filling of the left vertebrobasilar junction from the right vertebral artery injection is noted. The right common carotid arteriogram demonstrates the origin of the right external carotid artery and its major branches to be widely patent. The right internal carotid artery at the bulb has a smooth shallow atherosclerotic plaque without evidence of ulcerations or of intraluminal filling defects. The vessel is seen to opacify to the cranial skull base with mild FMD- like changes in the mid cervical segment. Distally the petrous, cavernous and supraclinoid segments are widely patent. Flash filling of the right posterior communicating artery and the right posterior cerebral artery is seen. The right middle cerebral artery and the right anterior cerebral artery opacify into the capillary and venous phases. The left common carotid arteriogram demonstrates the left external carotid artery and its major branches to be widely patent. The left internal carotid artery at the bulb and its proximal 1/3 is widely patent. The mid 1/3 segment of the left internal carotid artery demonstrates focal areas of smooth outpouching without intraluminal narrowing. Two focal outpouchings are seen arising at this site. The proximal one measures approximately 1.8 mm, whilst the distal one measures approximately 3.4 mm x 3.4 mm. 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 Distal to this the distal cervical, the petrous cavernous and the supraclinoid left ICA are  widely patent. The left middle cerebral artery and the left anterior cerebral artery opacify into the capillary and venous phases. Again noted is flash filling of the left posterior cerebral artery via the left posterior communicating artery. A left subclavian arteriogram demonstrates of the origin of the left vertebral artery with a very fine string sign. There is contrast noted just distal to this. More distally the left vertebral artery is non-opacified. There is suggestion of delayed opacification of the of the left vertebrobasilar junction from muscular collaterals arising from the ascending cervical branch of the of the left thyrocervical trunk. IMPRESSION: Approximately 30% stenosis of the proximal right vertebral artery secondary to a smooth circumferential soft atherosclerotic plaque. 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 See discussion above. PLAN: Findings reviewed with the patient and referring neurologist. Follow-up CT angiogram of the head and neck in 6 months time. Electronically Signed   By: Julieanne Cotton M.D.   On: 05/22/2020 08:34   IR ANGIO VERTEBRAL SEL VERTEBRAL UNI R MOD SED  Result Date: 05/23/2020 CLINICAL DATA:  Posterior fossa ischemic strokes. 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 EXAM: IR ANGIO EXTERNAL CAROTID SEL EXT  CAROTID BILAT MOD SED COMPARISON:  MRI MRA of the brain of May 19, 2020. MEDICATIONS: Heparin 2000 units intra-arterially. No antibiotic was administered within 1  hour of the procedure. ANESTHESIA/SEDATION: Versed 1 mg IV; Fentanyl 25 mcg IV Moderate Sedation Time:  69 minutes The patient was continuously monitored during the procedure by the interventional radiology nurse under my direct supervision. CONTRAST:  Isovue 300 approximately 100 mL FLUOROSCOPY TIME:  Fluoroscopy Time: 23 minutes 18 seconds (857 mGy). COMPLICATIONS: None immediate. TECHNIQUE: Informed written consent was obtained from the patient after a thorough discussion of the procedural risks, benefits and alternatives. All questions were addressed. Maximal Sterile Barrier Technique was utilized including caps, mask, sterile gowns, sterile gloves, sterile drape, hand hygiene and skin antiseptic. A timeout was performed prior to the initiation of the procedure. The right forearm to the wrist was prepped and draped in the usual sterile manner. The right radial artery was then identified with ultrasound, and its morphology documented. A dorsal palmar anastomosis was verified to be present. Using ultrasound guidance, radial access was obtained with a micropuncture set. Over a 0.018 inch micro guidewire, a 4/5 French radial sheath was inserted. The obturator, and the micro guidewire were removed. Good aspiration obtained from the side port of the sheath. A cocktail of 2000 units of heparin, and 200 mcg of nitroglycerin was then infused in diluted form through the sheath without event. A right radial arteriogram was performed. Over a 0.035 inch Roadrunner guidewire, a 5 [place] Simmons 2 diagnostic catheter was advanced to the aortic arch region, and selectively positioned in the right vertebral artery, the right common carotid artery. However, access to the left common carotid artery and the left subclavian artery could not be obtained via the radial approach due to the aortic arch anatomy. A wrist band was then applied at the right radial puncture site for hemostasis. The right radial pulse was verified to be  present. The right common femoral artery approach was then utilized. Over a 0.035 inch guidewire, a 5 French Pinnacle sheath was inserted. Through this and also over a 0.035 inch guidewire, a 5 [place] JB 1 catheter was advanced to the aortic arch region and selectively positioned in the left common carotid artery and the left subclavian artery. FINDINGS: The right vertebral artery origin is widely patent. There is a 30% stenosis of the proximal right vertebral artery secondary to a smooth circumferential plaque. More distally the vessel is seen to opacify to the cranial skull base. Patency is seen of the right vertebrobasilar junction and the right posterior-inferior cerebellar artery. The basilar artery, the posterior cerebral arteries, the superior cerebellar arteries and the anterior-inferior cerebellar arteries opacify into the capillary and venous phases. Flash filling of the left vertebrobasilar junction from the right vertebral artery injection is noted. The right common carotid arteriogram demonstrates the origin of the right external carotid artery and its major branches to be widely patent. The right internal carotid artery at the bulb has a smooth shallow atherosclerotic plaque without evidence of ulcerations or of intraluminal filling defects. The vessel is seen to opacify to the cranial skull base with mild FMD- like changes in the mid cervical segment. Distally the petrous, cavernous and supraclinoid segments are widely patent. Flash filling of the right posterior communicating artery and the right posterior cerebral artery is seen. The right middle cerebral artery and the right anterior cerebral artery opacify into the capillary and venous phases. The left common carotid arteriogram demonstrates the left external carotid artery  and its major branches to be widely patent. The left internal carotid artery at the bulb and its proximal 1/3 is widely patent. The mid 1/3 segment of the left internal carotid  artery demonstrates focal areas of smooth outpouching without intraluminal narrowing. Two focal outpouchings are seen arising at this site. The proximal one measures approximately 1.8 mm, whilst the distal one measures approximately 3.4 mm x 3.4 mm. 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 Distal to this the distal cervical, the petrous cavernous and the supraclinoid left ICA are widely patent. The left middle cerebral artery and the left anterior cerebral artery opacify into the capillary and venous phases. Again noted is flash filling of the left posterior cerebral artery via the left posterior communicating artery. A left subclavian arteriogram demonstrates of the origin of the left vertebral artery with a very fine string sign. There is contrast noted just distal to this. More distally the left vertebral artery is non-opacified. There is suggestion of delayed opacification of the of the left vertebrobasilar junction from muscular collaterals arising from the ascending cervical branch of the of the left thyrocervical trunk. IMPRESSION: Approximately 30% stenosis of the proximal right vertebral artery secondary to a smooth circumferential soft atherosclerotic plaque. 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 See discussion above. PLAN: Findings reviewed with the patient and referring neurologist. Follow-up CT angiogram of the head and neck in 6 months time. Electronically Signed   By: Julieanne Cotton M.D.   On:  05/22/2020 08:34   IR ANGIO EXTERNAL CAROTID SEL EXT CAROTID BILAT MOD SED  Result Date: 05/23/2020 CLINICAL DATA:  Posterior fossa ischemic strokes. 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 EXAM: IR ANGIO EXTERNAL CAROTID SEL EXT CAROTID BILAT MOD SED COMPARISON:  MRI MRA of the brain of May 19, 2020. MEDICATIONS: Heparin 2000 units intra-arterially. No antibiotic was administered within 1 hour of the procedure. ANESTHESIA/SEDATION: Versed 1 mg IV; Fentanyl 25 mcg IV Moderate Sedation Time:  69 minutes The patient was continuously monitored during the procedure by the interventional radiology nurse under my direct supervision. CONTRAST:  Isovue 300 approximately 100 mL FLUOROSCOPY TIME:  Fluoroscopy Time: 23 minutes 18 seconds (857 mGy). COMPLICATIONS: None immediate. TECHNIQUE: Informed written consent was obtained from the patient after a thorough discussion of the procedural risks, benefits and alternatives. All questions were addressed. Maximal Sterile Barrier Technique was utilized including caps, mask, sterile gowns, sterile gloves, sterile drape, hand hygiene and skin antiseptic. A timeout was performed prior to the initiation of the procedure. The right forearm to the wrist was prepped and draped in the usual sterile manner. The right radial artery was then identified with ultrasound, and its morphology documented. A dorsal palmar anastomosis was verified to be present. Using ultrasound guidance, radial access was obtained with a micropuncture set. Over a 0.018 inch micro guidewire, a 4/5 French radial sheath was inserted. The obturator, and the micro guidewire were removed. Good aspiration obtained from the side port of the sheath. A cocktail of 2000 units of heparin, and 200 mcg of nitroglycerin was then infused in  diluted form through the sheath without event. A right radial arteriogram was performed. Over a 0.035 inch Roadrunner guidewire, a 5 [place] Simmons 2  diagnostic catheter was advanced to the aortic arch region, and selectively positioned in the right vertebral artery, the right common carotid artery. However, access to the left common carotid artery and the left subclavian artery could not be obtained via the radial approach due to the aortic arch anatomy. A wrist band was then applied at the right radial puncture site for hemostasis. The right radial pulse was verified to be present. The right common femoral artery approach was then utilized. Over a 0.035 inch guidewire, a 5 French Pinnacle sheath was inserted. Through this and also over a 0.035 inch guidewire, a 5 [place] JB 1 catheter was advanced to the aortic arch region and selectively positioned in the left common carotid artery and the left subclavian artery. FINDINGS: The right vertebral artery origin is widely patent. There is a 30% stenosis of the proximal right vertebral artery secondary to a smooth circumferential plaque. More distally the vessel is seen to opacify to the cranial skull base. Patency is seen of the right vertebrobasilar junction and the right posterior-inferior cerebellar artery. The basilar artery, the posterior cerebral arteries, the superior cerebellar arteries and the anterior-inferior cerebellar arteries opacify into the capillary and venous phases. Flash filling of the left vertebrobasilar junction from the right vertebral artery injection is noted. The right common carotid arteriogram demonstrates the origin of the right external carotid artery and its major branches to be widely patent. The right internal carotid artery at the bulb has a smooth shallow atherosclerotic plaque without evidence of ulcerations or of intraluminal filling defects. The vessel is seen to opacify to the cranial skull base with mild FMD- like changes in the mid cervical segment. Distally the petrous, cavernous and supraclinoid segments are widely patent. Flash filling of the right posterior communicating  artery and the right posterior cerebral artery is seen. The right middle cerebral artery and the right anterior cerebral artery opacify into the capillary and venous phases. The left common carotid arteriogram demonstrates the left external carotid artery and its major branches to be widely patent. The left internal carotid artery at the bulb and its proximal 1/3 is widely patent. The mid 1/3 segment of the left internal carotid artery demonstrates focal areas of smooth outpouching without intraluminal narrowing. Two focal outpouchings are seen arising at this site. The proximal one measures approximately 1.8 mm, whilst the distal one measures approximately 3.4 mm x 3.4 mm. 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 Distal to this the distal cervical, the petrous cavernous and the supraclinoid left ICA are widely patent. The left middle cerebral artery and the left anterior cerebral artery opacify into the capillary and venous phases. Again noted is flash filling of the left posterior cerebral artery via the left posterior communicating artery. A left subclavian arteriogram demonstrates of the origin of the left vertebral artery with a very fine string sign. There is contrast noted just distal to this. More distally the left vertebral artery is non-opacified. There is suggestion of delayed opacification of the of the left vertebrobasilar junction from muscular collaterals arising from the ascending cervical branch of the of the left thyrocervical trunk. IMPRESSION: Approximately 30% stenosis of the proximal right vertebral artery secondary to a smooth circumferential soft atherosclerotic plaque. Occluded origin of the left vertebral artery with attempts at partial reconstitution just distal to this, and also the left vertebrobasilar junction  from faint collaterals arising from the ascending cervical branch of the left thyrocervical trunk. Bilateral fibromuscular dysplastic changes  involving the mid cervical internal carotid arteries, more prominent on the left associated with 2 small pseudo aneurysms, the largest measuring approximately 3.4 mm x 3.4 mm involving the left internal carotid artery. See discussion above. PLAN: Findings reviewed with the patient and referring neurologist. Follow-up CT angiogram of the head and neck in 6 months time. Electronically Signed   By: Julieanne Cotton M.D.   On: 05/22/2020 08:34    Labs:  Basic Metabolic Panel: Recent Labs  Lab 05/20/20 0330 05/23/20 0313  NA 139 138  K 4.4 3.7  CL 105 103  CO2 27 26  GLUCOSE 87 114*  BUN 13 12  CREATININE 0.79 0.81  CALCIUM 9.3 9.2    CBC: Recent Labs  Lab 05/20/20 0330 05/23/20 0313  WBC 6.7 7.1  HGB 12.7 11.3*  HCT 37.5 33.1*  MCV 89.9 88.5  PLT 196 177    CBG: Recent Labs  Lab 05/19/20 0704  GLUCAP 84   Family history.  Father with emphysema and hypertension.  Brother with kidney disease.  Denies any colon cancer esophageal cancer or rectal cancer  Brief HPI:   Jeanette Barnes is a 65 y.o. right-handed female with history of hypertension hyperlipidemia mitral valve prolapse.  Patient lives alone independent and active prior to admission.  She has a daughter in the area.  Presented 05/18/2020 with dizziness lethargy and unstable gait of acute onset.  Patient got up to go to the bathroom had a vomiting episode fell struck her left side of her head and neck on the trash bin.  Denied any loss of consciousness.  MRI/MRI showed patchy acute ischemic nonhemorrhagic infarct involving the left cerebellum cerebellar vermis right temporal occipital region and left midbrain pons.  MRA of the neck showed occlusion of left vertebral artery with concern for acute dissection.  MRA of the head partial occlusion of left vertebral artery at its origin.  Cerebral arteriogram showed occluded left VA at its origin without distal reconstitution 30% stenosis of proximal right VA with no dissection  identified.  Echocardiogram with ejection fraction of 60 to 65% no wall motion abnormalities.  Currently maintained on aspirin and Plavix x3 months then aspirin alone.  30-day cardiac event monitor was recommended.  Tolerating a regular diet.  Therapy evaluations completed due to patient's dizziness gait instability was admitted for a comprehensive rehab program.   Hospital Course: Niquita Digioia was admitted to rehab 05/23/2020 for inpatient therapies to consist of PT, ST and OT at least three hours five days a week. Past admission physiatrist, therapy team and rehab RN have worked together to provide customized collaborative inpatient rehab.  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  Plan for 30-day cardiac event monitor.  Aspirin and Plavix x3 months and aspirin alone with follow-up neurology services.  Blood pressure monitored on low-dose HCTZ.  Lipitor for hyperlipidemia.  History of mitral valve prolapse no chest pain or shortness of breath   Blood pressures were monitored on TID basis and controlled    Rehab course: During patient's stay in rehab weekly team conferences were held to monitor patient's progress, set goals and discuss barriers to discharge. At admission, patient required min mod assist 200 feet rolling walker minimal assist stand pivot transfers supervision side-lying to sitting supervision supine to sit minimal assist  sit to supine.  Minimal assist upper body bathing max is lower body bathing minimal assist upper body dressing minimal assist lower body dressing  Physical exam.  Blood pressure 136/69 pulse 72 temperature 98 respirations 18 oxygen saturation 90% room air Constitutional.  No acute distress HEENT Head.  Normocephalic and atraumatic Eyes.  Pupils round and reactive to light no discharge.nystagmus Neck.  Supple nontender no JVD  without thyromegaly Cardiac regular rate rhythm without extra sounds or murmur heard Abdomen.  Soft nontender positive bowel sounds without rebound Respiratory effort normal no respiratory distress without wheeze Neurologic.  Alert oriented makes eye contact with examiner follows commands. Motor.  Bilateral upper extremities 5/5 proximal distal Right lower extremity 5/5 proximal distal Left lower extremity 45/5 proximal distal sensation intact.  No ataxia bilateral upper extremities  He/She  has had improvement in activity tolerance, balance, postural control as well as ability to compensate for deficits. He/She has had improvement in functional use RUE/LUE  and RLE/LLE as well as improvement in awareness.  Patient doing very well with therapies modified independent.  She was able to gather belongings for activities day living and homemaking.  Therapy evaluations were completed as well as family teaching and discharged to home       Disposition: Discharged to home    Diet: Regular  Special Instructions: No driving smoking or alcohol  Continue aspirin 81 mg daily and Plavix 75 mg day x3 months then aspirin alone  Medications at discharge 1.  Albuterol inhaler as needed 2.  Aspirin 81 mg p.o. daily 3.  Lipitor 80 mg daily 4.  Vitamin D 400 units daily 5.  Plavix 75 mg p.o. daily 6.  Hydrochlorothiazide 25 mg p.o. daily 7.  Protonix 40 mg p.o. daily 8.  Vitamin B12 1000 mcg p.o. daily  30-35 minutes were spent completing discharge summary and discharge planning  Discharge Instructions    Ambulatory referral to Neurology   Complete by: As directed    An appointment is requested in approximately 4 weeks left cerebellum cerebellar vermis right temporal parietal occipital infarction     Allergies as of 05/25/2020   No Known Allergies     Medication List    STOP taking these medications   acetaminophen 500 MG tablet Commonly known as: TYLENOL     TAKE these medications    albuterol 108 (90 Base) MCG/ACT inhaler Commonly known as: VENTOLIN HFA Inhale 2 puffs into the lungs every 6 (six) hours as needed for wheezing or shortness of breath.   aspirin 81 MG EC tablet Take 1 tablet (81 mg total) by mouth daily. Swallow whole.   atorvastatin 80 MG tablet Commonly known as: LIPITOR Take 1 tablet (80 mg total) by mouth daily.   cholecalciferol 10 MCG (400 UNIT) Tabs tablet Commonly known as: VITAMIN D3 Take 400 Units by mouth.   clopidogrel 75 MG tablet Commonly known as: PLAVIX Take 1 tablet (75 mg total) by mouth daily.   hydrochlorothiazide 25 MG tablet Commonly known as: HYDRODIURIL Take 1 tablet (25 mg total) by mouth daily.   pantoprazole 40 MG tablet Commonly known as: PROTONIX Take 1 tablet (40 mg total) by mouth daily.   vitamin B-12 1000 MCG tablet Commonly known as: CYANOCOBALAMIN Take 1,000 mcg by mouth daily.       Follow-up Information    Kirsteins, Victorino Sparrow, MD Follow up.   Specialty: Physical Medicine and Rehabilitation Why: Office to call for appointment Contact information: [id number] [place]  [address] [phone]               Signed: Mcarthur Rossetti Angiulli 05/26/2020, 7:07 AM

## 2020-05-26 NOTE — Telephone Encounter (Signed)
Pt c/o medication issue:  1. Name of Medication: plavix  2. How are you currently taking this medication (dosage and times per day)?   3. Are you having a reaction (difficulty breathing--STAT)? no  4. What is your medication issue? Patient was in hospital and was told to call to Dr Swaziland to confirm if she needs to continue taking the plavix. Please advise

## 2020-05-26 NOTE — Telephone Encounter (Signed)
She was admitted with a stroke recently. Echo was OK. Recommended she have a 30 day event monitor to look for Afib. Ok to order  Peter Swaziland MD, [facility]

## 2020-05-26 NOTE — Telephone Encounter (Signed)
Spoke to patient Dr.Jordan's advice given.Advised monitor will be mailed to your home.Order placed.Stated she needed refills for Plavix and Lipitor.Refills sent to pharmacy.

## 2020-05-26 NOTE — Telephone Encounter (Signed)
Spoke with the patient who states that she was discharged from the hospital and advised to contact Dr. Swaziland. She states that she had an echocardiogram that was abnormal and suggested that she wear an event monitor. She also states that she was started on Plavix, ASA, and Lipitor. She would like to know if Dr. Swaziland could review her echocardiogram and if he wanted to order a heart monitor for her.

## 2020-05-30 ENCOUNTER — Ambulatory Visit (INDEPENDENT_AMBULATORY_CARE_PROVIDER_SITE_OTHER): Payer: Medicare HMO

## 2020-05-30 DIAGNOSIS — I63112 Cerebral infarction due to embolism of left vertebral artery: Secondary | ICD-10-CM | POA: Diagnosis not present

## 2020-06-24 ENCOUNTER — Encounter: Payer: Medicare HMO | Attending: Physical Medicine & Rehabilitation | Admitting: Physical Medicine & Rehabilitation

## 2020-06-24 ENCOUNTER — Other Ambulatory Visit: Payer: Self-pay

## 2020-06-24 ENCOUNTER — Encounter: Payer: Self-pay | Admitting: Physical Medicine & Rehabilitation

## 2020-06-24 VITALS — BP 133/73 | HR 106 | Temp 98.0°F | Ht 63.5 in | Wt 137.6 lb

## 2020-06-24 DIAGNOSIS — I63212 Cerebral infarction due to unspecified occlusion or stenosis of left vertebral arteries: Secondary | ICD-10-CM | POA: Diagnosis not present

## 2020-06-24 NOTE — Progress Notes (Signed)
Subjective:    Patient ID: Jeanette Barnes, female    DOB: 11/25/1955, 65 y.o.   MRN: [id number] 65 y.o. right-handed female with history of hypertension hyperlipidemia mitral valve prolapse.  Patient lives alone independent and active prior to admission.  She has a daughter in the area.  Presented 05/18/2020 with dizziness lethargy and unstable gait of acute onset.  Patient got up to go to the bathroom had a vomiting episode fell struck her left side of her head and neck on the trash bin.  Denied any loss of consciousness.  MRI/MRI showed patchy acute ischemic nonhemorrhagic infarct involving the left cerebellum cerebellar vermis right temporal occipital region and left midbrain pons.  MRA of the neck showed occlusion of left vertebral artery with concern for acute dissection.  MRA of the head partial occlusion of left vertebral artery at its origin.  Cerebral arteriogram showed occluded left VA at its origin without distal reconstitution 30% stenosis of proximal right VA with no dissection identified.  Echocardiogram with ejection fraction of 60 to 65% no wall motion abnormalities.  Currently maintained on aspirin and Plavix x3 months then aspirin alone.  30-day cardiac event monitor was recommended.  Tolerating a regular diet.  Therapy evaluations completed due to patient's dizziness gait instability was admitted for a comprehensive rehab program.  Admit date: 05/23/2020 Discharge date: 05/25/2020  HPI  Patient lives at her own home she is independent with all of ADLs she does not require an assistive device for ambulation.  She is able to cook.  She has gone back to driving and has done well with this no collisions. She is here with her daughter today. She has no complaints, no longer receiving therapies.  Patient has a neurologist and PCP with [facility].  She still sees Dr. Swaziland from cardiology, [facility] Pain Inventory Average Pain 4 Pain Right Now 4 My pain is intermittent and  dull  LOCATION OF PAIN  Head, fingers  BOWEL Number of stools per week: 7 Oral laxative use No  Type of laxative  Enema or suppository use No  History of colostomy No  Incontinent No   BLADDER Normal In and out cath, frequency na Able to self cath na Bladder incontinence No  Frequent urination No  Leakage with coughing No  Difficulty starting stream No  Incomplete bladder emptying No    Mobility ability to climb steps?  yes do you drive?  yes  Function employed # of hrs/week .  Neuro/Psych anxiety  Prior Studies HFU  Physicians involved in your care HFU   Family History  Problem Relation Age of Onset  . Emphysema Father   . Hypertension Brother   . Kidney disease Brother    Social History   Socioeconomic History  . Marital status: Single    Spouse name: Not on file  . Number of children: 2  . Years of education: Not on file  . Highest education level: Bachelor's degree (e.g., BA, AB, BS)  Occupational History  . Not on file  Tobacco Use  . Smoking status: Never Smoker  . Smokeless tobacco: Never Used  Vaping Use  . Vaping Use: Never used  Substance and Sexual Activity  . Alcohol use: No  . Drug use: No  . Sexual activity: Not on file  Other Topics Concern  . Not on file  Social History Narrative  . Not on file   Social Determinants of Health   Financial Resource Strain: Not on file  Food Insecurity: Not on file  Transportation Needs: Not on file  Physical Activity: Not on file  Stress: Not on file  Social Connections: Not on file   Past Surgical History:  Procedure Laterality Date  . ABDOMINAL HYSTERECTOMY    . BREAST BIOPSY    . BREAST EXCISIONAL BIOPSY Bilateral 1990's   2 Left, 1 Right, all benign  . IR ANGIO EXTERNAL CAROTID SEL EXT CAROTID BILAT MOD SED  05/21/2020  . IR ANGIO VERTEBRAL SEL SUBCLAVIAN INNOMINATE UNI L MOD SED  05/21/2020  . IR ANGIO VERTEBRAL SEL VERTEBRAL UNI R MOD SED  05/21/2020  . IR US GUIDE VASC ACCESS RIGHT   05/21/2020   Past Medical History:  Diagnosis Date  . AAT (alpha-1-antitrypsin) deficiency (HCC)   . Anxiety   . Heart murmur    mitral valve prolapse  . Hypercholesteremia   . Hypertension   . Pulmonary nodule   . Vitamin D deficiency    BP 133/73   Pulse (!) 106   Temp 98 F (36.7 C)   Ht 5' 3.5" (1.613 m)   Wt 137 lb 9.6 oz (62.4 kg)   SpO2 99%   BMI 23.99 kg/m   Opioid Risk Score:   Fall Risk Score:  `1  Depression screen PHQ 2/9  Depression screen PHQ 2/9 06/24/2020  Decreased Interest 0  Down, Depressed, Hopeless 0  PHQ - 2 Score 0  Altered sleeping 1  Tired, decreased energy 1  Change in appetite 0  Feeling bad or failure about yourself  0  Trouble concentrating 0  Moving slowly or fidgety/restless 0  Suicidal thoughts 0  PHQ-9 Score 2  Difficult doing work/chores Somewhat difficult     Review of Systems  Musculoskeletal:       Fingers  Neurological: Positive for headaches.  Psychiatric/Behavioral: The patient is nervous/anxious.   All other systems reviewed and are negative.      Objective:   Physical Exam Vitals and nursing note reviewed.  Constitutional:      Appearance: She is normal weight.  HENT:     Head: Normocephalic and atraumatic.  Eyes:     Extraocular Movements: Extraocular movements intact.     Conjunctiva/sclera: Conjunctivae normal.     Pupils: Pupils are equal, round, and reactive to light.  Musculoskeletal:     Comments: No pain with upper extremity or lower extremity range of motion  Skin:    General: Skin is warm and dry.  Neurological:     Mental Status: She is alert and oriented to person, place, and time.     Cranial Nerves: No cranial nerve deficit or dysarthria.     Sensory: Sensation is intact.     Motor: No weakness or tremor.     Coordination: Coordination is intact. Romberg sign negative.     Gait: Gait is intact.     Comments: Patient has 5/5 strength bilateral deltoid, bicep, tricep, grip, hip flexor, knee  extensor, ankle dorsiflexor and plantar flexor Sensation intact to light touch bilateral upper and lower limbs Visual fields are intact, ambulates without assistive device no evidence of toe drag or knee instability.  She is able to toe walk as well as heel walk and can also perform tandem gait normally  Psychiatric:        Mood and Affect: Mood normal.        Behavior: Behavior normal.           Assessment & Plan:  1.  Left posterior  circulation infarcts no residual deficits.  She will follow-up with neurology as well as cardiology for secondary stroke prevention.  No driving restrictions recommended. Physical medicine rehab follow-up on as-needed basis We discussed the importance of regular walking i.e. every day.  She still feels like her left lower limb is not as strong as the right side we discussed sit to stand exercises 3 sets of 10 every other day

## 2020-06-24 NOTE — Patient Instructions (Signed)
Please do sit to stand exercise every other day in addition to walking every day

## 2020-07-16 NOTE — Progress Notes (Signed)
thanks

## 2020-07-29 ENCOUNTER — Inpatient Hospital Stay: Payer: Medicare HMO | Admitting: Neurology

## 2020-07-30 ENCOUNTER — Other Ambulatory Visit: Payer: Self-pay | Admitting: Family Medicine

## 2020-07-30 DIAGNOSIS — Z1231 Encounter for screening mammogram for malignant neoplasm of breast: Secondary | ICD-10-CM

## 2020-09-25 ENCOUNTER — Other Ambulatory Visit: Payer: Self-pay

## 2020-09-25 ENCOUNTER — Ambulatory Visit
Admission: RE | Admit: 2020-09-25 | Discharge: 2020-09-25 | Disposition: A | Discharge status: Home / Self Care | Payer: Medicare HMO | Source: Ambulatory Visit | Attending: Family Medicine | Admitting: Family Medicine

## 2020-09-25 DIAGNOSIS — Z1231 Encounter for screening mammogram for malignant neoplasm of breast: Secondary | ICD-10-CM

## 2021-01-13 ENCOUNTER — Other Ambulatory Visit (HOSPITAL_COMMUNITY): Payer: Self-pay | Admitting: Interventional Radiology

## 2021-01-13 DIAGNOSIS — I639 Cerebral infarction, unspecified: Secondary | ICD-10-CM

## 2021-01-22 ENCOUNTER — Ambulatory Visit (HOSPITAL_COMMUNITY)
Admission: RE | Admit: 2021-01-22 | Discharge: 2021-01-22 | Disposition: A | Discharge status: Home / Self Care | Payer: Medicare HMO | Source: Ambulatory Visit | Attending: Interventional Radiology | Admitting: Interventional Radiology

## 2021-01-22 DIAGNOSIS — I639 Cerebral infarction, unspecified: Secondary | ICD-10-CM | POA: Diagnosis present

## 2021-01-22 MED ORDER — IOHEXOL 350 MG/ML SOLN
75.0000 mL | Freq: Once | INTRAVENOUS | Status: AC | PRN
  Administered 2021-01-22: 75 mL via INTRAVENOUS

## 2021-01-26 ENCOUNTER — Telehealth (HOSPITAL_COMMUNITY): Payer: Self-pay

## 2021-01-26 NOTE — Telephone Encounter (Signed)
Pt agreed to f/u in 6 months with cta head/neck. AW 

## 2021-02-16 LAB — COLOGUARD: COLOGUARD: NEGATIVE

## 2021-07-07 ENCOUNTER — Encounter: Payer: Self-pay | Admitting: Physician Assistant

## 2021-07-07 ENCOUNTER — Ambulatory Visit: Payer: Medicare HMO | Admitting: Physician Assistant

## 2021-07-07 VITALS — BP 140/84 | HR 71 | Ht 62.0 in | Wt 129.6 lb

## 2021-07-07 DIAGNOSIS — E785 Hyperlipidemia, unspecified: Secondary | ICD-10-CM

## 2021-07-07 DIAGNOSIS — R0789 Other chest pain: Secondary | ICD-10-CM | POA: Diagnosis not present

## 2021-07-07 DIAGNOSIS — Z8673 Personal history of transient ischemic attack (TIA), and cerebral infarction without residual deficits: Secondary | ICD-10-CM

## 2021-07-07 DIAGNOSIS — I10 Essential (primary) hypertension: Secondary | ICD-10-CM | POA: Diagnosis not present

## 2021-07-07 NOTE — Progress Notes (Signed)
?Cardiology Office Note:   ? ?Date:  07/09/2021  ? ?ID:  Jeanette Barnes, DOB 1955/06/12, MRN [id number] ? ?PCP:  Macy Mis, MD ?  ?[facility] Providers ?Cardiologist:  Peter Swaziland, MD    ? ?Referring MD: Macy Mis, MD  ? ?Chief Complaint  ?Patient presents with  ? Abnormal ECG  ?  Seen for Dr. Swaziland  ? ? ?History of Present Illness:   ? ?Jeanette Barnes is a 66 y.o. female with a hx of alpha 1 antitrypsin deficiency, hypertension and hyperlipidemia.  Patient was previously seen for atypical chest discomfort.  She was told in her 73s that she had mitral valve prolapse.  Over the years, she had echocardiogram and nuclear stress test performed in [place], last was in 2017.  Patient was last seen by Dr. Swaziland on 12/07/2019, due to her history of chest pain and dyspnea on exertion, a coronary CT was ordered and performed on 12/26/2019 which showed coronary calcium score of 0, no evidence of CAD.  Noncardiac portion also did not reveal any acute finding.  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  She was outside the tPA window on arrival.  She underwent image guided diagnostic cerebral arteriogram by Dr. Titus Dubin of interventional radiology on 05/21/2020 that showed occluded origin of the left vertebral artery, bilateral fibromuscular dysplastic changes involving the mid cervical internal carotid arteries with 2 small pseudoaneurysms with the largest one measuring 3.4 x 3.4 mm.  Echocardiogram obtained during the same admission demonstrated EF 60 to 65%, mild LVH, grade 1 DD, RVSP 26.6 mmHg, trivial MR.  Neurology service recommended to antiplatelet therapy with aspirin and Plavix for 3 months followed by aspirin alone afterward.  Interventional radiology recommended 28-month CTA of head and neck.  A 30-day preventives  monitor was performed in April 2022 which showed single 4 beat run of nonsustained VT, otherwise normal sinus rhythm, no atrial fibrillation was noted.  Since discharge, patient has been followed by [facility] neurology service.  On follow-up with neurology service, it appears patient has self discontinued all stroke medication.  325 mg daily of aspirin was restarted. ? ?Patient presents today for evaluation of intermittent chest pain.  This is the same chest discomfort she has experienced over the past several years.  There is no increasing frequency or duration.  She recently saw her PCP about 5 days ago, it was felt her chest discomfort was atypical, however it was noted she had ST changes in the inferior lead on the EKG and also P wave inversion, therefore she was referred to cardiology service for reassessment.  She says her chest discomfort typically occur at rest and does not occur with physical activity.  It occurs several times a month.  Her chest is slightly tender upon deep palpation.  Given the fact that she had a clean coronary previous coronary CT in October 2021, I do not recommend any further work-up for her atypical chest discomfort.  We plan to see the patient back in 6 months for reassessment.  EKG today showed normal sinus rhythm, no ST-T wave changes.  There was no ST changes noted in the inferior leads or any other leads on today's EKG.  I question if the abnormal EKG at PCPs office was due to lead placement. ? ? ?Past Medical History:  ?Diagnosis Date  ?  AAT (alpha-1-antitrypsin) deficiency (HCC)   ? Anxiety   ? Heart murmur   ? mitral valve prolapse  ? Hypercholesteremia   ? Hypertension   ? Pulmonary nodule   ? Vitamin D deficiency   ? ? ?Past Surgical History:  ?Procedure Laterality Date  ? ABDOMINAL HYSTERECTOMY    ? BREAST BIOPSY    ? BREAST EXCISIONAL BIOPSY Bilateral 1990's  ? 2 Left, 1 Right, all benign  ? IR ANGIO EXTERNAL CAROTID SEL EXT CAROTID BILAT MOD SED  05/21/2020  ? IR ANGIO  VERTEBRAL SEL SUBCLAVIAN INNOMINATE UNI L MOD SED  05/21/2020  ? IR ANGIO VERTEBRAL SEL VERTEBRAL UNI R MOD SED  05/21/2020  ? IR US GUIDE VASC ACCESS RIGHT  05/21/2020  ? ? ?Current Medications: ?Current Meds  ?Medication Sig  ? albuterol (VENTOLIN HFA) 108 (90 Base) MCG/ACT inhaler Inhale 2 puffs into the lungs every 6 (six) hours as needed for wheezing or shortness of breath.  ? Alum Hydroxide-Mag Carbonate (GAVISCON EXTRA STRENGTH) 160-105 MG CHEW 2 tablets after meals and at bedtime as needed  ? aspirin 325 MG EC tablet Take 325 mg by mouth daily.  ? cholecalciferol (VITAMIN D) 400 units TABS tablet Take 400 Units by mouth.  ? famotidine (PEPCID) 20 MG tablet Take 20 mg by mouth as needed.  ? hydrochlorothiazide (HYDRODIURIL) 25 MG tablet Take 1 tablet (25 mg total) by mouth daily.  ? rosuvastatin (CRESTOR) 10 MG tablet Take by mouth.  ? Saccharomyces boulardii (PROBIOTIC) 250 MG CAPS Take by mouth as needed.  ? vitamin B-12 (CYANOCOBALAMIN) 1000 MCG tablet Take 1,000 mcg by mouth daily.  ? vitamin E 45 MG (100 UNITS) capsule as needed.  ? [DISCONTINUED] aspirin EC 81 MG EC tablet Take 1 tablet (81 mg total) by mouth daily. Swallow whole. (Patient taking differently: Take 325 mg by mouth daily. Swallow whole.)  ?  ? ?Allergies:   Patient has no known allergies.  ? ?Social History  ? ?Socioeconomic History  ? Marital status: Single  ?  Spouse name: Not on file  ? Number of children: 2  ? Years of education: Not on file  ? Highest education level: Bachelor's degree (e.g., BA, AB, BS)  ?Occupational History  ? Not on file  ?Tobacco Use  ? Smoking status: Never  ? Smokeless tobacco: Never  ?Vaping Use  ? Vaping Use: Never used  ?Substance and Sexual Activity  ? Alcohol use: No  ? Drug use: No  ? Sexual activity: Not on file  ?Other Topics Concern  ? Not on file  ?Social History Narrative  ? Not on file  ? ?Social Determinants of Health  ? ?Financial Resource Strain: Not on file  ?Food Insecurity: Not on file   ?Transportation Needs: Not on file  ?Physical Activity: Not on file  ?Stress: Not on file  ?Social Connections: Not on file  ?  ? ?Family History: ?The patient's family history includes Emphysema in her father; Hypertension in her brother; Kidney disease in her brother. ? ?ROS:   ?Please see the history of present illness.    ? All other systems reviewed and are negative. ? ?EKGs/Labs/Other Studies Reviewed:   ? ?The following studies were reviewed today: ? ?Echo 05/2020 ? 1. Left ventricular ejection fraction, by estimation, is 60 to 65%. The  ?left ventricle has normal function. The left ventricle has no regional  ?wall motion abnormalities. There is mild concentric left ventricular  ?hypertrophy with more notable, discrete  ?thickening of the  basal-septal segment. Left ventricular diastolic  ?parameters are consistent with Grade I diastolic dysfunction (impaired  ?relaxation).  ? 2. Right ventricular systolic function is normal. The right ventricular  ?size is mildly enlarged. There is normal pulmonary artery systolic  ?pressure. The estimated right ventricular systolic pressure is 26.6 mmHg.  ? 3. The mitral valve is grossly normal. Trivial mitral valve  ?regurgitation.  ? 4. The aortic valve is tricuspid. Aortic valve regurgitation is not  ?visualized. No aortic stenosis is present.  ? 5. The inferior vena cava is normal in size with greater than 50%  ?respiratory variability, suggesting right atrial pressure of 3 mmHg.  ? ?Comparison(s): No prior Echocardiogram.  ? ?Conclusion(s)/Recommendation(s): No intracardiac source of embolism  ?detected on this transthoracic study. A transesophageal echocardiogram is  ?recommended to exclude cardiac source of embolism if clinically indicated. ? ?EKG:  EKG is ordered today.  The ekg ordered today demonstrates normal sinus rhythm, no significant ST-T wave changes ? ?Recent Labs: ?No results found for requested labs within last 8760 hours.  ?Recent Lipid Panel ?    ?Component Value Date/Time  ? CHOL 237 (H) 05/19/2020 0349  ? TRIG 53 05/19/2020 0349  ? HDL 66 05/19/2020 0349  ? CHOLHDL 3.6 05/19/2020 0349  ? VLDL 11 05/19/2020 0349  ? LDLCALC 160 (H) 05/19/2020 0349  ? ? ? ?Risk Assessment/Ca

## 2021-07-07 NOTE — Patient Instructions (Signed)
Medication Instructions:  ?Your physician recommends that you continue on your current medications as directed. Please refer to the Current Medication list given to you today. ? ?*If you need a refill on your cardiac medications before your next appointment, please call your pharmacy* ? ?Lab Work: ?NONE ordered at this time of appointment  ? ?If you have labs (blood work) drawn today and your tests are completely normal, you will receive your results only by: ?MyChart Message (if you have MyChart) OR ?A paper copy in the mail ?If you have any lab test that is abnormal or we need to change your treatment, we will call you to review the results. ? ?Testing/Procedures: ?NONE ordered at this time of appointment  ? ?Follow-Up: ?At [facility], you and your health needs are our priority.  As part of our continuing mission to provide you with exceptional heart care, we have created designated Provider Care Teams.  These Care Teams include your primary Cardiologist (physician) and Advanced Practice Providers (APPs -  Physician Assistants and Nurse Practitioners) who all work together to provide you with the care you need, when you need it. ? ? ?Your next appointment:   ?6 month(s) ? ?The format for your next appointment:   ?In Person ? ?Provider:   ?Peter Jordan, MD   ? ?Other Instructions ? ? ?Important Information About Sugar ? ? ? ? ? ? ?

## 2021-07-09 ENCOUNTER — Encounter: Payer: Self-pay | Admitting: Physician Assistant

## 2021-09-08 ENCOUNTER — Other Ambulatory Visit: Payer: Self-pay | Admitting: Family Medicine

## 2021-09-08 DIAGNOSIS — Z1231 Encounter for screening mammogram for malignant neoplasm of breast: Secondary | ICD-10-CM

## 2021-10-01 ENCOUNTER — Ambulatory Visit
Admission: RE | Admit: 2021-10-01 | Discharge: 2021-10-01 | Disposition: A | Discharge status: Home / Self Care | Payer: Medicare HMO | Source: Ambulatory Visit | Attending: Family Medicine | Admitting: Family Medicine

## 2021-10-01 DIAGNOSIS — Z1231 Encounter for screening mammogram for malignant neoplasm of breast: Secondary | ICD-10-CM

## 2021-12-09 LAB — HM DEXA SCAN

## 2022-01-04 NOTE — Progress Notes (Unsigned)
Cardiology Office Note:    Date:  01/07/2022   ID:  Jeanette Barnes, DOB 1955/08/28, MRN [id number]  PCP:  Macy Mis, MD   [facility] Prov[facility] Cardiologist:  Albie Arizpe Swaziland, MD     Referring MD: Macy Mis, MD   Chief Complaint  Patient presents with   Chest Pain    History of Present Illness:    Jeanette Barnes is a 66 y.o. female with a hx of alpha 1 antitrypsin deficiency, hypertension and hyperlipidemia.  Patient was previously seen for atypical chest discomfort.  She was told in her 30s that she had mitral valve prolapse.  Over the years, she had echocardiogram and nuclear stress test performed in [place], last was in 2017.  Patient was seen  on 12/07/2019, due to her history of chest pain and dyspnea on exertion, a coronary CT was ordered and performed on 12/26/2019 which showed coronary calcium score of 0, no evidence of CAD.  Noncardiac portion also did not reveal any acute finding.    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  She was outside the tPA window on arrival.  She underwent image guided diagnostic cerebral arteriogram by Dr. Titus Dubin of interventional radiology on 05/21/2020 that showed occluded origin of the left vertebral artery, bilateral fibromuscular dysplastic changes involving the mid cervical internal carotid arteries with 2 small pseudoaneurysms with the largest one measuring 3.4 x 3.4 mm.  Echocardiogram obtained during the same admission demonstrated EF 60 to 65%, mild LVH, grade 1 DD, RVSP 26.6 mmHg, trivial MR.  Neurology service recommended to antiplatelet therapy with aspirin and Plavix for 3 months followed by aspirin alone afterward.  Interventional radiology recommended 102-month CTA of head and neck.  A 30-day preventives monitor was performed in April 2022 which  showed single 4 beat run of nonsustained VT, otherwise normal sinus rhythm, no atrial fibrillation was noted.  Since discharge, patient has been followed by [facility] neurolog[facility] service.  On follow-up with neurology service, it appears patient has self discontinued all stroke medication.  325 mg daily of aspirin was restarted.  She was seen by Azalee Course PA-C in April for evaluation of intermittent chest pain.  This is the same chest discomfort she has experienced over the past several years. Given the fact that she had a clean coronary previous coronary CT in October 2021 no further work up recommended.    On follow up today she notes she is feeling great. Went on a [employer] in August. Quit taking all her medication except a couple of vitamins. Reports statins caused her LFTs to go up. Still has the same random chest pain as before. Not related to activity.    Past Medical History:  Diagnosis Date   AAT (alpha-1-antitrypsin) deficiency (HCC)    Anxiety    Heart murmur    mitral valve prolapse   Hypercholesteremia    Hypertension    Pulmonary nodule    Vitamin D deficiency     Past Surgical History:  Procedure Laterality Date   ABDOMINAL HYSTERECTOMY     BREAST BIOPSY     BREAST EXCISIONAL BIOPSY Bilateral 1990's   2 Left, 1 Right, all benign   IR ANGIO EXTERNAL CAROTID SEL EXT CAROTID BILAT MOD SED  05/21/2020   IR ANGIO VERTEBRAL SEL SUBCLAVIAN INNOMINATE UNI L MOD SED  05/21/2020   IR ANGIO VERTEBRAL SEL VERTEBRAL UNI R MOD SED  05/21/2020   IR US GUIDE VASC ACCESS RIGHT  05/21/2020    Current Medications: Current Meds  Medication Sig   cholecalciferol (VITAMIN D) 400 units TABS tablet Take 400 Units by mouth.   vitamin B-12 (CYANOCOBALAMIN) 1000 MCG tablet Take 1,000 mcg by mouth daily.   vitamin E 45 MG (100 UNITS) capsule as needed.     Allergies:   Patient has no known allergies.   Social History   Socioeconomic History   Marital status: Single    Spouse name: Not on file    Number of children: 2   Years of education: Not on file   Highest education level: Bachelor's degree (e.g., BA, AB, BS)  Occupational History   Not on file  Tobacco Use   Smoking status: Never   Smokeless tobacco: Never  Vaping Use   Vaping Use: Never used  Substance and Sexual Activity   Alcohol use: No   Drug use: No   Sexual activity: Not on file  Other Topics Concern   Not on file  Social History Narrative   Not on file   Social Determinants of Health   Financial Resource Strain: Not on file  Food Insecurity: Not on file  Transportation Needs: Not on file  Physical Activity: Not on file  Stress: Not on file  Social Connections: Not on file     Family History: The patient's family history includes Emphysema in her father; Hypertension in her brother; Kidney disease in her brother.  ROS:   Please see the history of present illness.     All other systems reviewed and are negative.  EKGs/Labs/Other Studies Reviewed:    The following studies were reviewed today:  Echo 05/2020  1. Left ventricular ejection fraction, by estimation, is 60 to 65%. The  left ventricle has normal function. The left ventricle has no regional  wall motion abnormalities. There is mild concentric left ventricular  hypertrophy with more notable, discrete  thickening of the basal-septal segment. Left ventricular diastolic  parameters are consistent with Grade I diastolic dysfunction (impaired  relaxation).   2. Right ventricular systolic function is normal. The right ventricular  size is mildly enlarged. There is normal pulmonary artery systolic  pressure. The estimated right ventricular systolic pressure is 26.6 mmHg.   3. The mitral valve is grossly normal. Trivial mitral valve  regurgitation.   4. The aortic valve is tricuspid. Aortic valve regurgitation is not  visualized. No aortic stenosis is present.   5. The inferior vena cava is normal in size with greater than 50%  respiratory  variability, suggesting right atrial pressure of 3 mmHg.   Comparison(s): No prior Echocardiogram.   Conclusion(s)/Recommendation(s): No intracardiac source of embolism  detected on this transthoracic study. A transesophageal echocardiogram is  recommended to exclude cardiac source of embolism if clinically indicated.  EKG:  EKG is not ordered today.    Recent Labs: No results found for requested labs within last 365 days.  Recent Lipid Panel    Component Value Date/Time   CHOL 237 (H) 05/19/2020 0349   TRIG 53 05/19/2020 0349   HDL 66 05/19/2020 0349   CHOLHDL 3.6 05/19/2020 0349   VLDL 11 05/19/2020 0349   LDLCALC 160 (H) 05/19/2020 0349    Risk Assessment/Calculations:           Physical Exam:    VS:  BP 134/88 (BP Location: Left Arm, Patient Position: Sitting,  Cuff Size: Normal)   Pulse 74   Ht 5\' 2"  (1.575 m)   Wt 121 lb 12.8 oz (55.2 kg)   SpO2 99%   BMI 22.28 kg/m     Wt Readings from Last 3 Encounters:  01/07/22 121 lb 12.8 oz (55.2 kg)  07/07/21 129 lb 9.6 oz (58.8 kg)  06/24/20 137 lb 9.6 oz (62.4 kg)     GEN:  Well nourished, well developed in no acute distress HEENT: Normal NECK: No JVD; No carotid bruits LYMPHATICS: No lymphadenopathy CARDIAC: RRR, no murmurs, rubs, gallops RESPIRATORY:  Clear to auscultation without rales, wheezing or rhonchi  ABDOMEN: Soft, non-tender, non-distended MUSCULOSKELETAL:  No edema; No deformity  SKIN: Warm and dry NEUROLOGIC:  Alert and oriented x 3 PSYCHIATRIC:  Normal affect   ASSESSMENT:    1. Atypical chest pain   2. Essential hypertension   3. Hyperlipidemia LDL goal <100   4. H/O: CVA (cerebrovascular accident)     PLAN:    In order of problems listed above:  Atypical chest pain: noncardiac.  Previous coronary CT obtained on 12/26/2019 showed clean coronary arteries.  Echo showed normal LV function. At this time, I recommend continued observation.   Hypertension: Blood pressure stable. Off  medication now. Working on lifestyle modification.   Hyperlipidemia: intolerant of statin due to elevated LFTs. Last LDL 146 in August. Discussed importance of cholesterol lowering for secondary prevention of stroke. Discussed that there are other options for treating cholesterol besides statins including Zetia, Nexlitol, PCSK 9 inhibitors, or Leqvio. Will follow up with PCP.   History of CVA: recommend she resume ASA 81 mg daily. If unable to tolerate due to GI side effects would start Plavix       Follow up in one year   There are no Patient Instructions on file for this visit.   Signed, Lilah Mijangos [place], MD  01/07/2022 2:36 PM    [place]

## 2022-01-07 ENCOUNTER — Encounter: Payer: Self-pay | Admitting: Cardiology

## 2022-01-07 ENCOUNTER — Ambulatory Visit: Payer: Medicare HMO | Attending: Cardiology | Admitting: Cardiology

## 2022-01-07 VITALS — BP 134/88 | HR 74 | Ht 62.0 in | Wt 121.8 lb

## 2022-01-07 DIAGNOSIS — Z8673 Personal history of transient ischemic attack (TIA), and cerebral infarction without residual deficits: Secondary | ICD-10-CM

## 2022-01-07 DIAGNOSIS — R0789 Other chest pain: Secondary | ICD-10-CM | POA: Diagnosis not present

## 2022-01-07 DIAGNOSIS — I10 Essential (primary) hypertension: Secondary | ICD-10-CM

## 2022-01-07 DIAGNOSIS — E785 Hyperlipidemia, unspecified: Secondary | ICD-10-CM

## 2022-01-07 MED ORDER — ASPIRIN 81 MG PO TBEC: 81.0000 mg | DELAYED_RELEASE_TABLET | Freq: Every day | ORAL | 3 refills | Status: AC

## 2022-01-07 NOTE — Patient Instructions (Signed)
Medication Instructions:  Start Aspirin 81 mg daily Continue all other medications *If you need a refill on your cardiac medications before your next appointment, please call your pharmacy*   Lab Work: None ordered   Testing/Procedures: None ordered   Follow-Up: At [facility], you and your health needs are our priority.  As part of our continuing mission to provide you with exceptional heart care, we have created designated Provider Care Teams.  These Care Teams include your primary Cardiologist (physician) and Advanced Practice Providers (APPs -  Physician Assistants and Nurse Practitioners) who all work together to provide you with the care you need, when you need it.  We recommend signing up for the patient portal called "MyChart".  Sign up information is provided on this After Visit Summary.  MyChart is used to connect with patients for Virtual Visits (Telemedicine).  Patients are able to view lab/test results, encounter notes, upcoming appointments, etc.  Non-urgent messages can be sent to your provider as well.   To learn more about what you can do with MyChart, go to [email or web address].    Your next appointment:  1 year   Call in July to schedule Oct appointment     The format for your next appointment: Office   Provider: Dr.Jordan  Important Information About Sugar

## 2022-07-01 LAB — BASIC METABOLIC PANEL
BUN: 11 (ref 4–21)
CO2: 25 — AB (ref 13–22)
Chloride: 104 (ref 99–108)
Creatinine: 0.7 (ref 0.5–1.1)
Glucose: 92
Potassium: 3.9 mEq/L (ref 3.5–5.1)
Sodium: 144 (ref 137–147)

## 2022-07-01 LAB — HEPATIC FUNCTION PANEL
ALT: 46 U/L — AB (ref 7–35)
AST: 48 — AB (ref 13–35)

## 2022-07-01 LAB — COMPREHENSIVE METABOLIC PANEL
Calcium: 9 (ref 8.7–10.7)
Globulin: 2.2
eGFR: 92

## 2022-07-13 IMAGING — MR MR HEAD W/O CM
12 of 13 series · 44 of 48 positions shown · IV contrast (gadavist)
Comparison: Prior CT from 03/30/2018.

CLINICAL DATA: Initial evaluation for neuro deficit, stroke
suspected, vertigo.

EXAM:
MRI HEAD WITHOUT CONTRAST
MRA HEAD WITHOUT CONTRAST
MRA NECK WITHOUT AND WITH CONTRAST
TECHNIQUE: Multiplanar, multiecho pulse sequences of the brain and surrounding
structures were obtained without intravenous contrast. Angiographic
images of the Circle of Willis were obtained using MRA technique
without intravenous contrast. Angiographic images of the neck were
obtained using MRA technique without and with intravenous contrast.
Carotid stenosis measurements (when applicable) are obtained
utilizing NASCET criteria, using the distal internal carotid
diameter as the denominator.
CONTRAST:  6.5mL GADAVIST GADOBUTROL 1 MMOL/ML IV SOLN

[Series 5: DWI · axial · 3.0mm · 0.88mm/px · z∈[-86,+54]mm · 8 of 96 slices shown (1 of 4)]
[im 1/96]
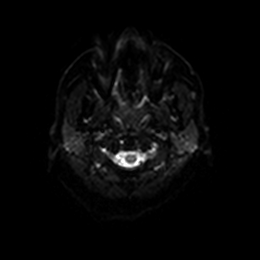
[im 14/96]
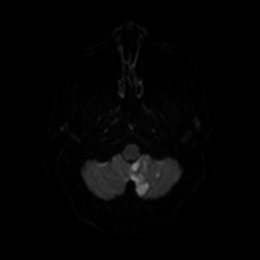
[im 28/96]
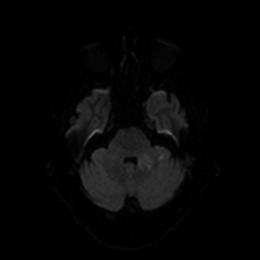
[im 41/96]
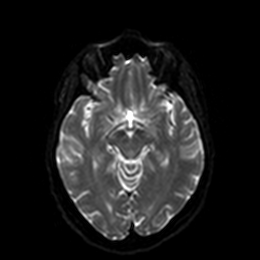
[im 55/96]
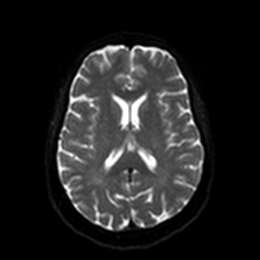
[im 68/96]
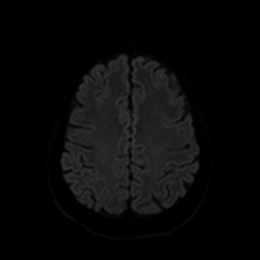
[im 82/96]
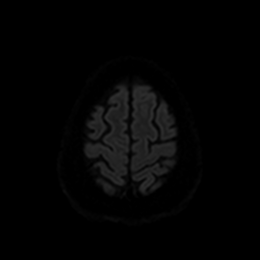
[im 96/96]
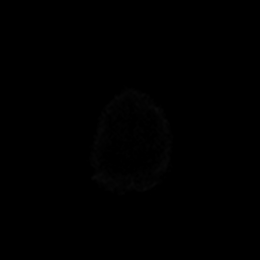

[Series 6: DWI · axial · 3.0mm · 0.88mm/px · z∈[-86,+54]mm · 4 of 48 slices shown (2 of 4)]
[im 1/48]
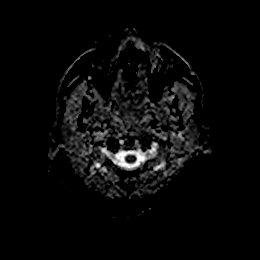
[im 16/48]
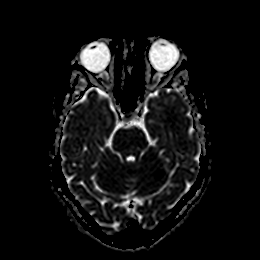
[im 32/48]
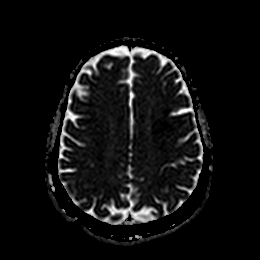
[im 48/48]
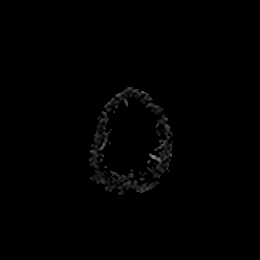

[Series 7: T1 · sagittal · 5.0mm · 0.72mm/px · 2 of 25 slices shown]
[im 1/25]
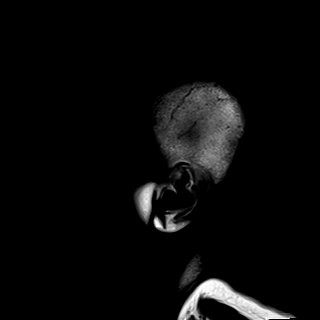
[im 25/25]
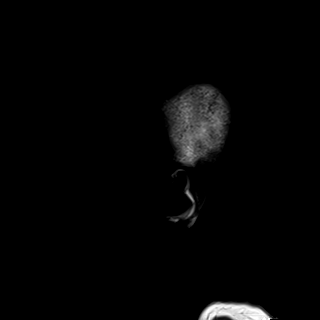

[Series 8: DWI · coronal · 4.0mm · 0.88mm/px · 5 of 70 slices shown (3 of 4)]
[im 1/70]
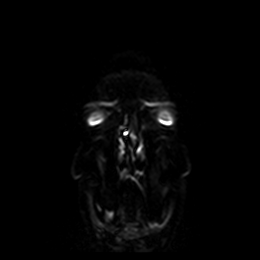
[im 18/70]
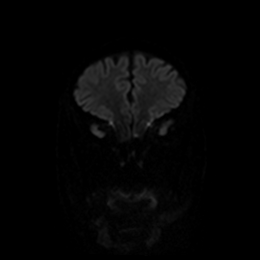
[im 35/70]
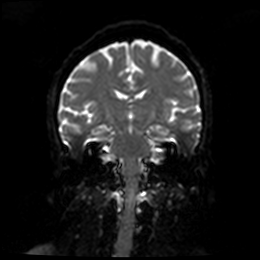
[im 52/70]
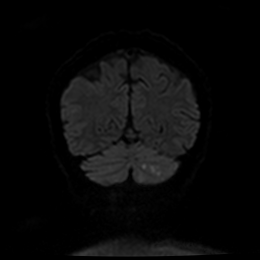
[im 70/70]
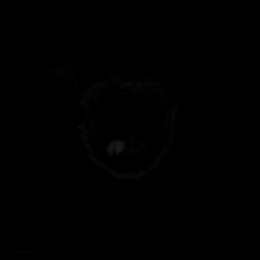

[Series 9: DWI · coronal · 4.0mm · 0.88mm/px · 3 of 35 slices shown (4 of 4)]
[im 1/35]
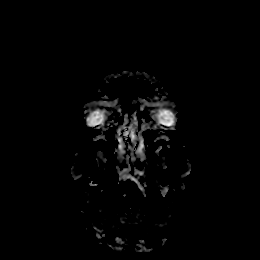
[im 18/35]
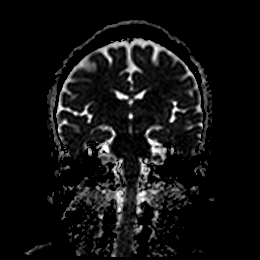
[im 35/35]
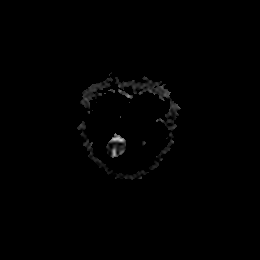

[Series 10: T2 · axial · 5.0mm · 0.72mm/px · z∈[-90,+53]mm · 2 of 25 slices shown (1 of 2)]
[im 1/25]
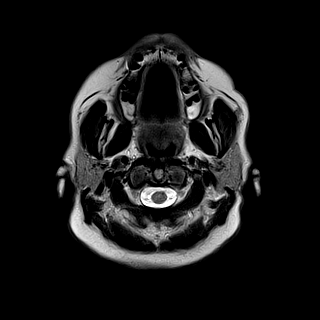
[im 25/25]
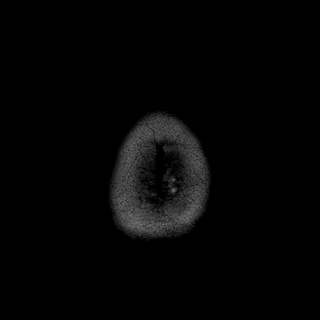

[Series 11: FLAIR · axial · 5.0mm · 0.90mm/px · z∈[-90,+53]mm · 2 of 25 slices shown]
[im 1/25]
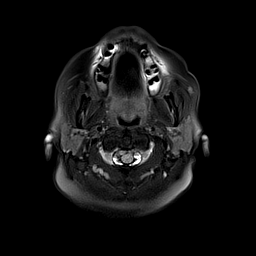
[im 25/25]
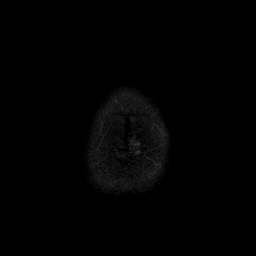

[Series 12: mag_images · axial · 3.0mm · 0.90mm/px · z∈[-100,+64]mm · 4 of 56 slices shown]
[im 1/56]
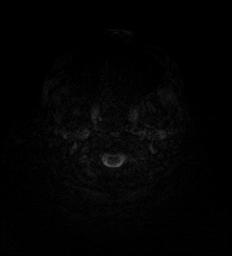
[im 19/56]
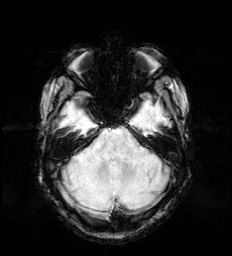
[im 37/56]
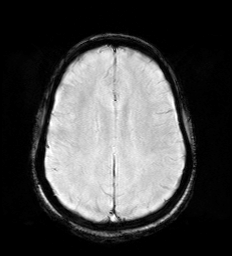
[im 56/56]
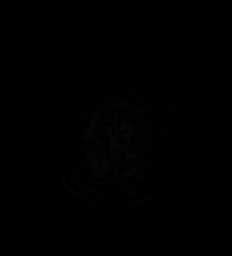

[Series 13: pha_images · axial · 3.0mm · 0.90mm/px · z∈[-100,+64]mm · 4 of 56 slices shown]
[im 1/56]
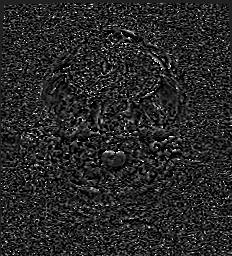
[im 19/56]
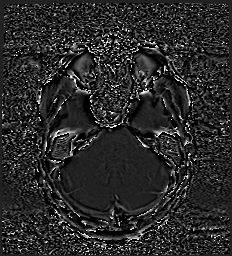
[im 37/56]
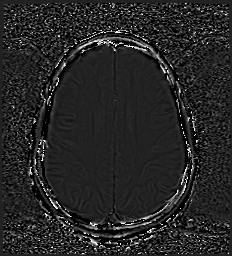
[im 56/56]
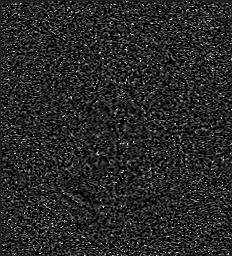

[Series 14: swi_images · axial · 3.0mm · 0.90mm/px · z∈[-100,+64]mm · 4 of 56 slices shown]
[im 1/56]
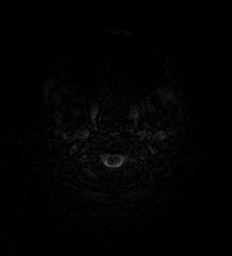
[im 19/56]
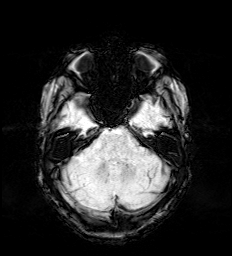
[im 37/56]
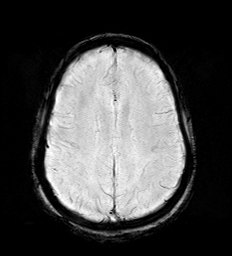
[im 56/56]
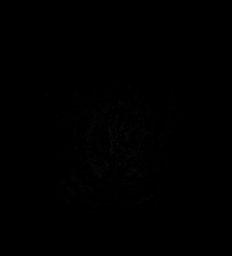

[Series 15: mip_images(sw) · axial · 24.0mm · 0.90mm/px · z∈[-90,+53]mm · 4 of 49 slices shown]
[im 1/49]
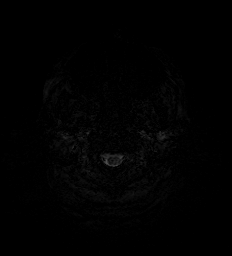
[im 17/49]
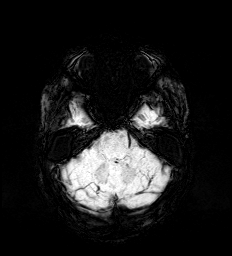
[im 33/49]
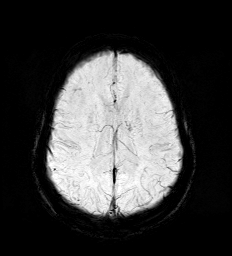
[im 49/49]
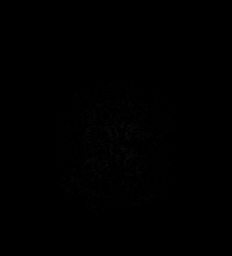

[Series 17: T2 · coronal · 5.0mm · 0.72mm/px · 2 of 30 slices shown (2 of 2)]
[im 1/30]
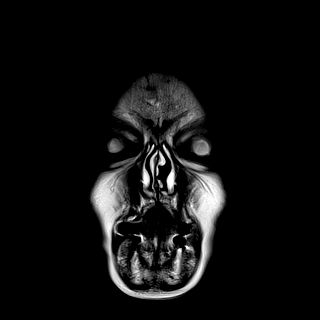
[im 30/30]
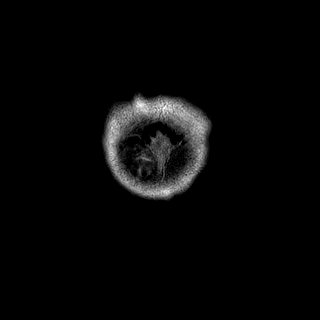

[44 of 48 positions shown; findings below may reference images not displayed]

FINDINGS: MRI HEAD FINDINGS

Brain: Cerebral volume within normal limits for patient age. No
significant cerebral white matter disease for age.

Patchy multifocal areas of restricted diffusion seen involving the
left cerebellum, most pronounced inferiorly. Minimal patchy
involvement of the central cerebellar vermis. Additional patchy
small volume areas of restricted diffusion seen involving the
paramedian right temporal occipital region. Mild diffusion
abnormality also noted at the left midbrain/pons. No associated
hemorrhage or mass effect.

Otherwise, gray-white matter differentiation well maintained. No
other areas of chronic cortical infarction. No foci of
susceptibility artifact to suggest acute or chronic intracranial
hemorrhage.

No mass lesion, midline shift or mass effect. No hydrocephalus or
extra-axial fluid collection. Pituitary gland suprasellar region
normal. Midline structures intact.

Vascular: Major intracranial vascular flow voids are maintained.

Skull and upper cervical spine: Craniocervical junction within
normal limits. Bone marrow signal intensity normal. No scalp soft
tissue abnormality. Reversal of the normal upper cervical lordosis
noted.

Sinuses/Orbits: Globes and orbital soft tissues demonstrate no acute
finding. Paranasal sinuses are clear. No mastoid effusion. Inner ear
structures grossly normal.

Other: None.

MRA HEAD FINDINGS

ANTERIOR CIRCULATION:

Visualized distal cervical segments are both widely patent with
antegrade flow. Petrous, cavernous, and supraclinoid segments patent
without stenosis or other abnormality. A1 segments widely patent.
Normal anterior communicating artery complex. Anterior cerebral
arteries patent to their distal aspects without stenosis. No M1
stenosis or occlusion. Normal MCA bifurcations. Distal MCA branches
well perfused and symmetric.

POSTERIOR CIRCULATION:

Dominant right vertebral artery widely patent to the vertebrobasilar
junction. Right PICA patent and well perfused. Faint flow seen
within the partially visualized distal left vertebral artery, with
relatively absent flow within the left V3 segment. The left
vertebral artery appears occluded as it courses into the cranial
vault. Attenuated irregular flow seen within the left V4 segment
distally, and remains grossly patent to the vertebrobasilar
junction. Left PICA grossly patent at its origin. Basilar patent to
its distal aspect without stenosis or visible abnormality. Right
superior cerebral artery widely patent. Left SCA faintly visible and
appears grossly patent on time-of-flight sequence, not seen on MIP
reconstructions. Both PCAs primarily supplied via the basilar. PCAs
appear well perfused to their distal aspects.

No intracranial aneurysm.

MRA NECK FINDINGS

AORTIC ARCH: Visualized aortic arch normal in caliber with normal 3
vessel morphology. No hemodynamically significant stenosis seen
about the origin of the great vessels.

RIGHT CAROTID SYSTEM: Right CCA patent from its origin to the
bifurcation without stenosis. Mild for age atheromatous irregularity
about the right carotid bulb with no more than mild 20-25% stenosis
by NASCET criteria. Right ICA patent distally without stenosis,
evidence for dissection or occlusion.

LEFT CAROTID SYSTEM: Left common carotid artery widely patent from
its origin to the bifurcation. Mild for age atheromatous
irregularity about the left carotid bulb with no more than mild
20-25% by NASCET criteria. Left ICA otherwise patent without
stenosis, evidence for dissection, or occlusion. Minimal
irregularity about the mid cervical left ICA noted.

VERTEBRAL ARTERIES: Both vertebral arteries arise from the
subclavian arteries. Right vertebral artery likely dominant. There
is a short-segment moderate approximate 50% stenosis involving the
pre foraminal right V1 segment (series 3794, image 10). Otherwise,
the right vertebral artery is widely patent within the neck without
stenosis, evidence for dissection, or occlusion. On the left, the
left vertebral artery appears occluded at its origin (series 18,
image 32). Distally, the left ICA is markedly attenuated and
diminutive, with near occlusion at its distal V2 segment. Some flow
is seen within the left V3 segment, but is also markedly attenuated
and irregular. Given the presence of acute posterior circulation
infarcts, finding raises the possibility for an acute dissection.
IMPRESSION: MRI HEAD IMPRESSION:

1. Patchy acute ischemic nonhemorrhagic infarcts involving the left
cerebellum, cerebellar vermis, right temporoccipital region, and
left midbrain/pons.
2. Otherwise normal brain MRI for age.

MRA HEAD IMPRESSION:

1. Occlusion of the left vertebral artery with concern for acute
dissection as below. Attenuated irregular flow within the left V4
segment distally may in part be retrograde in nature. Dominant right
vertebral artery widely patent.
2. Poor visualization of the left SCA, which also may be partially
occluded given the distribution of infarcts.
3. Widely patent anterior circulation.

MRA NECK IMPRESSION:

1. Partial occlusion of the left vertebral artery at its origin,
with severely attenuated and irregular flow distally within the left
V2 and V3 segments. Given the presence of acute posterior
circulation infarcts, these findings raise the possibility for an
acute arterial dissection. Correlation with dedicated CTA could be
performed for further evaluation as warranted.
2. Short-segment moderate approximate 50% stenosis involving the pre
foraminal right V1 segment. Otherwise wide patency of the dominant
right vertebral artery.
3. Mild for age atheromatous change about the carotid bifurcations
without significant stenosis. Otherwise wide patency of both carotid
artery systems in the neck.

Critical Value/emergent results were called by telephone at the time
of interpretation on 05/19/2020 at [DATE] a.m. to provider Dr. Chifa
Asghar, who verbally acknowledged these results.

## 2022-07-26 DIAGNOSIS — L821 Other seborrheic keratosis: Secondary | ICD-10-CM | POA: Diagnosis not present

## 2022-07-26 DIAGNOSIS — Z129 Encounter for screening for malignant neoplasm, site unspecified: Secondary | ICD-10-CM | POA: Diagnosis not present

## 2022-07-26 DIAGNOSIS — D1801 Hemangioma of skin and subcutaneous tissue: Secondary | ICD-10-CM | POA: Diagnosis not present

## 2022-07-26 DIAGNOSIS — L603 Nail dystrophy: Secondary | ICD-10-CM | POA: Diagnosis not present

## 2022-07-26 DIAGNOSIS — L700 Acne vulgaris: Secondary | ICD-10-CM | POA: Diagnosis not present

## 2022-09-06 ENCOUNTER — Ambulatory Visit: Payer: Medicare HMO | Admitting: Internal Medicine

## 2022-09-24 DIAGNOSIS — M940 Chondrocostal junction syndrome [Tietze]: Secondary | ICD-10-CM | POA: Diagnosis not present

## 2022-09-24 DIAGNOSIS — M545 Low back pain, unspecified: Secondary | ICD-10-CM | POA: Diagnosis not present

## 2022-10-04 DIAGNOSIS — Z09 Encounter for follow-up examination after completed treatment for conditions other than malignant neoplasm: Secondary | ICD-10-CM | POA: Diagnosis not present

## 2022-10-04 DIAGNOSIS — R6889 Other general symptoms and signs: Secondary | ICD-10-CM | POA: Diagnosis not present

## 2022-10-04 DIAGNOSIS — I639 Cerebral infarction, unspecified: Secondary | ICD-10-CM | POA: Diagnosis not present

## 2022-10-04 DIAGNOSIS — Z8673 Personal history of transient ischemic attack (TIA), and cerebral infarction without residual deficits: Secondary | ICD-10-CM | POA: Diagnosis not present

## 2022-10-04 DIAGNOSIS — I10 Essential (primary) hypertension: Secondary | ICD-10-CM | POA: Diagnosis not present

## 2022-10-07 ENCOUNTER — Ambulatory Visit (INDEPENDENT_AMBULATORY_CARE_PROVIDER_SITE_OTHER): Payer: Medicare HMO | Admitting: Internal Medicine

## 2022-10-07 ENCOUNTER — Encounter: Payer: Self-pay | Admitting: Internal Medicine

## 2022-10-07 VITALS — BP 110/80 | HR 86 | Temp 98.2°F | Ht 62.0 in | Wt 131.1 lb

## 2022-10-07 DIAGNOSIS — Z8673 Personal history of transient ischemic attack (TIA), and cerebral infarction without residual deficits: Secondary | ICD-10-CM

## 2022-10-07 DIAGNOSIS — R7401 Elevation of levels of liver transaminase levels: Secondary | ICD-10-CM

## 2022-10-07 DIAGNOSIS — I10 Essential (primary) hypertension: Secondary | ICD-10-CM

## 2022-10-07 DIAGNOSIS — E785 Hyperlipidemia, unspecified: Secondary | ICD-10-CM

## 2022-10-07 DIAGNOSIS — I63212 Cerebral infarction due to unspecified occlusion or stenosis of left vertebral arteries: Secondary | ICD-10-CM

## 2022-10-07 DIAGNOSIS — F411 Generalized anxiety disorder: Secondary | ICD-10-CM | POA: Diagnosis not present

## 2022-10-07 MED ORDER — ALPRAZOLAM 0.5 MG PO TABS: 0.2500 mg | ORAL_TABLET | ORAL | 0 refills | Status: DC | PRN

## 2022-10-07 NOTE — Assessment & Plan Note (Signed)
Currently on rosuvastatin 5 mg but taking only once a week.  When she takes it more frequently she gets muscle aches and has transaminitis.  Check lipids today to follow.

## 2022-10-07 NOTE — Progress Notes (Signed)
New Patient Office Visit     CC/Reason for Visit: Establish care, discuss chronic conditions, medication refills Previous PCP: Delbert Harness Last Visit: 06/2021  HPI: Oluwaseun Bruyere is a 67 y.o. female who is coming in today for the above mentioned reasons. Past Medical History is significant for: Hypertension, hyperlipidemia, prior CVA, GERD.  She is taking hydrochlorothiazide daily, rosuvastatin only once a week due to issues with myalgias and transaminitis.  She has some left residual hemiplegia from her prior CVA and follows annually with neurology.  She has anxiety and takes 1/2 tablet of a 0.5 mg of Xanax very sporadically.  She was last given 10 tablets of this 2 years ago and still has 2 tablets remaining.  She is requesting a refill.  She is now due for Medicare wellness.   Past Medical/Surgical History: Past Medical History:  Diagnosis Date   AAT (alpha-1-antitrypsin) deficiency (HCC)    Anxiety    Heart murmur    mitral valve prolapse   Hypercholesteremia    Hypertension    Pulmonary nodule    Vitamin D deficiency     Past Surgical History:  Procedure Laterality Date   ABDOMINAL HYSTERECTOMY     BREAST BIOPSY     BREAST EXCISIONAL BIOPSY Bilateral 1990's   2 Left, 1 Right, all benign   IR ANGIO EXTERNAL CAROTID SEL EXT CAROTID BILAT MOD SED  05/21/2020   IR ANGIO VERTEBRAL SEL SUBCLAVIAN INNOMINATE UNI L MOD SED  05/21/2020   IR ANGIO VERTEBRAL SEL VERTEBRAL UNI R MOD SED  05/21/2020   IR US GUIDE VASC ACCESS RIGHT  05/21/2020    Social History:  reports that she has never smoked. She has never used smokeless tobacco. She reports that she does not drink alcohol and does not use drugs.  Allergies: No Known Allergies  Family History:  Family History  Problem Relation Age of Onset   Emphysema Father    Hypertension Brother    Kidney disease Brother      Current Outpatient Medications:    albuterol (VENTOLIN HFA) 108 (90 Base) MCG/ACT inhaler, Inhale 2 puffs  into the lungs every 6 (six) hours as needed for wheezing or shortness of breath., Disp: 18 g, Rfl: 6   ALPRAZolam (XANAX) 0.5 MG tablet, Take 0.5 tablets (0.25 mg total) by mouth as needed for anxiety., Disp: 20 tablet, Rfl: 0   aspirin EC 81 MG tablet, Take 1 tablet (81 mg total) by mouth daily. Swallow whole., Disp: 90 tablet, Rfl: 3   b complex vitamins capsule, Take 1 capsule by mouth daily., Disp: , Rfl:    cholecalciferol (VITAMIN D) 400 units TABS tablet, Take 400 Units by mouth., Disp: , Rfl:    co-enzyme Q-10 30 MG capsule, Take 30 mg by mouth 3 (three) times daily., Disp: , Rfl:    famotidine (PEPCID) 20 MG tablet, Take 20 mg by mouth as needed., Disp: , Rfl:    hydrochlorothiazide (HYDRODIURIL) 25 MG tablet, Take 1 tablet (25 mg total) by mouth daily., Disp: , Rfl:    rosuvastatin (CRESTOR) 10 MG tablet, Take by mouth., Disp: , Rfl:    vitamin B-12 (CYANOCOBALAMIN) 1000 MCG tablet, Take 1,000 mcg by mouth daily., Disp: , Rfl:    vitamin E 45 MG (100 UNITS) capsule, as needed., Disp: , Rfl:   Review of Systems:  Negative except as indicated in HPI.   Physical Exam: Vitals:   10/07/22 1459  BP: 110/80  Pulse: 86  Temp: 98.2 F (  36.8 C)  TempSrc: Oral  SpO2: 99%  Weight: 131 lb 1.6 oz (59.5 kg)  Height: 5\' 2"  (1.575 m)   Body mass index is 23.98 kg/m.  Physical Exam Vitals reviewed.  Constitutional:      Appearance: Normal appearance.  HENT:     Head: Normocephalic and atraumatic.  Eyes:     Conjunctiva/sclera: Conjunctivae normal.     Pupils: Pupils are equal, round, and reactive to light.  Cardiovascular:     Rate and Rhythm: Normal rate and regular rhythm.  Pulmonary:     Effort: Pulmonary effort is normal.     Breath sounds: Normal breath sounds.  Skin:    General: Skin is warm and dry.  Neurological:     General: No focal deficit present.     Mental Status: She is alert and oriented to person, place, and time.  Psychiatric:        Mood and Affect: Mood  normal.        Behavior: Behavior normal.        Thought Content: Thought content normal.        Judgment: Judgment normal.       Impression and Plan:  Essential hypertension Assessment & Plan: Well-controlled on hydrochlorothiazide 25 mg daily.   Dyslipidemia Assessment & Plan: Currently on rosuvastatin 5 mg but taking only once a week.  When she takes it more frequently she gets muscle aches and has transaminitis.  Check lipids today to follow.  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   GAD (generalized anxiety disorder) -     ALPRAZolam; Take 0.5 tablets (0.25 mg total) by mouth as needed for anxiety.  Dispense: 20 tablet; Refill: 0   Time spent: 46 minutes reviewing chart, interviewing and examining patient and formulating plan of care.    Chaya Jan, MD [facility]

## 2022-10-07 NOTE — Assessment & Plan Note (Signed)
Noted  

## 2022-10-07 NOTE — Assessment & Plan Note (Signed)
Well controlled on hydrochlorothiazide 25 mg daily.

## 2022-10-08 LAB — COMPREHENSIVE METABOLIC PANEL: Glucose, Bld: 78 mg/dL (ref 70–99)

## 2022-10-12 ENCOUNTER — Other Ambulatory Visit: Payer: Self-pay | Admitting: Internal Medicine

## 2022-10-12 DIAGNOSIS — Z1231 Encounter for screening mammogram for malignant neoplasm of breast: Secondary | ICD-10-CM

## 2022-10-13 ENCOUNTER — Encounter: Payer: Self-pay | Admitting: Internal Medicine

## 2022-10-14 ENCOUNTER — Ambulatory Visit (HOSPITAL_BASED_OUTPATIENT_CLINIC_OR_DEPARTMENT_OTHER)
Admission: RE | Admit: 2022-10-14 | Discharge: 2022-10-14 | Disposition: A | Discharge status: Home / Self Care | Payer: Medicare HMO | Source: Ambulatory Visit | Attending: Internal Medicine | Admitting: Internal Medicine

## 2022-10-14 DIAGNOSIS — Z1231 Encounter for screening mammogram for malignant neoplasm of breast: Secondary | ICD-10-CM | POA: Insufficient documentation

## 2022-10-27 ENCOUNTER — Ambulatory Visit (HOSPITAL_BASED_OUTPATIENT_CLINIC_OR_DEPARTMENT_OTHER): Payer: Medicare HMO | Admitting: Physical Therapy

## 2022-10-28 ENCOUNTER — Ambulatory Visit: Payer: Medicare HMO | Admitting: Pulmonary Disease

## 2022-10-28 ENCOUNTER — Encounter (INDEPENDENT_AMBULATORY_CARE_PROVIDER_SITE_OTHER): Payer: Self-pay

## 2022-11-03 ENCOUNTER — Other Ambulatory Visit: Payer: Self-pay | Admitting: *Deleted

## 2022-11-03 MED ORDER — HYDROCHLOROTHIAZIDE 25 MG PO TABS: 25.0000 mg | ORAL_TABLET | Freq: Every day | ORAL | 1 refills | Status: DC

## 2022-11-03 NOTE — Telephone Encounter (Signed)
[place]  Refill request hydrochlorothiazide Last filled by Almon Hercules, MD  Okay to refill?

## 2022-11-10 DIAGNOSIS — L608 Other nail disorders: Secondary | ICD-10-CM | POA: Diagnosis not present

## 2022-11-25 ENCOUNTER — Ambulatory Visit (INDEPENDENT_AMBULATORY_CARE_PROVIDER_SITE_OTHER): Payer: Medicare HMO | Admitting: Internal Medicine

## 2022-11-25 ENCOUNTER — Encounter: Payer: Self-pay | Admitting: Internal Medicine

## 2022-11-25 VITALS — BP 130/84 | HR 76 | Temp 98.1°F | Ht 63.0 in | Wt 135.4 lb

## 2022-11-25 DIAGNOSIS — I10 Essential (primary) hypertension: Secondary | ICD-10-CM

## 2022-11-25 DIAGNOSIS — I63112 Cerebral infarction due to embolism of left vertebral artery: Secondary | ICD-10-CM | POA: Diagnosis not present

## 2022-11-25 DIAGNOSIS — Z780 Asymptomatic menopausal state: Secondary | ICD-10-CM | POA: Diagnosis not present

## 2022-11-25 DIAGNOSIS — Z1159 Encounter for screening for other viral diseases: Secondary | ICD-10-CM

## 2022-11-25 DIAGNOSIS — E785 Hyperlipidemia, unspecified: Secondary | ICD-10-CM | POA: Diagnosis not present

## 2022-11-25 DIAGNOSIS — Z0000 Encounter for general adult medical examination without abnormal findings: Secondary | ICD-10-CM | POA: Diagnosis not present

## 2022-11-25 LAB — CBC WITH DIFFERENTIAL/PLATELET
Basophils Absolute: 0 10*3/uL (ref 0.0–0.1)
Basophils Relative: 0.2 % (ref 0.0–3.0)
Eosinophils Absolute: 0.2 10*3/uL (ref 0.0–0.7)
Eosinophils Relative: 2.9 % (ref 0.0–5.0)
HCT: 40.4 % (ref 36.0–46.0)
Hemoglobin: 13.3 g/dL (ref 12.0–15.0)
Lymphocytes Relative: 39.6 % (ref 12.0–46.0)
Lymphs Abs: 2.6 10*3/uL (ref 0.7–4.0)
MCHC: 32.9 g/dL (ref 30.0–36.0)
MCV: 91.5 fl (ref 78.0–100.0)
Monocytes Absolute: 0.7 10*3/uL (ref 0.1–1.0)
Monocytes Relative: 11.2 % (ref 3.0–12.0)
Neutro Abs: 3.1 10*3/uL (ref 1.4–7.7)
Neutrophils Relative %: 46.1 % (ref 43.0–77.0)
Platelets: 216 10*3/uL (ref 150.0–400.0)
RBC: 4.42 Mil/uL (ref 3.87–5.11)
RDW: 13.5 % (ref 11.5–15.5)
WBC: 6.7 10*3/uL (ref 4.0–10.5)

## 2022-11-25 LAB — LIPID PANEL
Cholesterol: 193 mg/dL (ref 0–200)
HDL: 64.6 mg/dL (ref >39.00)
LDL Cholesterol: 99 mg/dL (ref 0–99)
NonHDL: 128.07
Total CHOL/HDL Ratio: 3
Triglycerides: 144 mg/dL (ref 0.0–149.0)
VLDL: 28.8 mg/dL (ref 0.0–40.0)

## 2022-11-25 LAB — COMPREHENSIVE METABOLIC PANEL
ALT: 25 U/L (ref 0–35)
AST: 26 U/L (ref 0–37)
Albumin: 4.1 g/dL (ref 3.5–5.2)
Alkaline Phosphatase: 77 U/L (ref 39–117)
BUN: 16 mg/dL (ref 6–23)
CO2: 30 meq/L (ref 19–32)
Calcium: 9.4 mg/dL (ref 8.4–10.5)
Chloride: 102 meq/L (ref 96–112)
Creatinine, Ser: 0.68 mg/dL (ref 0.40–1.20)
GFR: 88.73 mL/min (ref >60.00)
Glucose, Bld: 85 mg/dL (ref 70–99)
Potassium: 3.6 meq/L (ref 3.5–5.1)
Sodium: 141 meq/L (ref 135–145)
Total Bilirubin: 0.3 mg/dL (ref 0.2–1.2)
Total Protein: 7.2 g/dL (ref 6.0–8.3)

## 2022-11-25 LAB — VITAMIN D 25 HYDROXY (VIT D DEFICIENCY, FRACTURES): VITD: 39.87 ng/mL (ref 30.00–100.00)

## 2022-11-25 NOTE — Progress Notes (Signed)
Established Patient Office Visit     CC/Reason for Visit: Annual preventive exam and subsequent Medicare wellness visit  HPI: Jeanette Barnes is a 67 y.o. female who is coming in today for the above mentioned reasons. Past Medical History is significant for: Hypertension, hyperlipidemia, history of CVA, GERD, generalized anxiety disorder.  Has routine eye and dental care.  Is overdue for Tdap, shingles, pneumonia, flu, COVID, RSV vaccines.  She elects to defer further cervical cancer screening.  Overdue for DEXA scan, colon and breast cancer screening is up-to-date.   Past Medical/Surgical History: Past Medical History:  Diagnosis Date   AAT (alpha-1-antitrypsin) deficiency (HCC)    Anxiety    Heart murmur    mitral valve prolapse   Hypercholesteremia    Hypertension    Pulmonary nodule    Vitamin D deficiency     Past Surgical History:  Procedure Laterality Date   ABDOMINAL HYSTERECTOMY     BREAST BIOPSY     BREAST EXCISIONAL BIOPSY Bilateral 1990's   2 Left, 1 Right, all benign   IR ANGIO EXTERNAL CAROTID SEL EXT CAROTID BILAT MOD SED  05/21/2020   IR ANGIO VERTEBRAL SEL SUBCLAVIAN INNOMINATE UNI L MOD SED  05/21/2020   IR ANGIO VERTEBRAL SEL VERTEBRAL UNI R MOD SED  05/21/2020   IR US GUIDE VASC ACCESS RIGHT  05/21/2020    Social History:  reports that she has never smoked. She has never used smokeless tobacco. She reports that she does not drink alcohol and does not use drugs.  Allergies: No Known Allergies  Family History:  Family History  Problem Relation Age of Onset   Emphysema Father    Hypertension Brother    Kidney disease Brother      Current Outpatient Medications:    albuterol (VENTOLIN HFA) 108 (90 Base) MCG/ACT inhaler, Inhale 2 puffs into the lungs every 6 (six) hours as needed for wheezing or shortness of breath., Disp: 18 g, Rfl: 6   ALPRAZolam (XANAX) 0.5 MG tablet, Take 0.5 tablets (0.25 mg total) by mouth as needed for anxiety., Disp: 20  tablet, Rfl: 0   aspirin EC 81 MG tablet, Take 1 tablet (81 mg total) by mouth daily. Swallow whole., Disp: 90 tablet, Rfl: 3   b complex vitamins capsule, Take 1 capsule by mouth daily., Disp: , Rfl:    cholecalciferol (VITAMIN D) 400 units TABS tablet, Take 400 Units by mouth., Disp: , Rfl:    co-enzyme Q-10 30 MG capsule, Take 30 mg by mouth 3 (three) times daily., Disp: , Rfl:    famotidine (PEPCID) 20 MG tablet, Take 20 mg by mouth as needed., Disp: , Rfl:    hydrochlorothiazide (HYDRODIURIL) 25 MG tablet, Take 1 tablet (25 mg total) by mouth daily., Disp: 90 tablet, Rfl: 1   rosuvastatin (CRESTOR) 10 MG tablet, Take by mouth., Disp: , Rfl:    vitamin B-12 (CYANOCOBALAMIN) 1000 MCG tablet, Take 1,000 mcg by mouth daily., Disp: , Rfl:    vitamin E 45 MG (100 UNITS) capsule, as needed., Disp: , Rfl:   Review of Systems:  Negative unless indicated in HPI.   Physical Exam: Vitals:   11/25/22 1357  BP: 130/84  Pulse: 76  Temp: 98.1 F (36.7 C)  TempSrc: Oral  SpO2: 98%  Weight: 135 lb 6.4 oz (61.4 kg)  Height: 5\' 3"  (1.6 m)    Body mass index is 23.99 kg/m.   Physical Exam Vitals reviewed.  Constitutional:  General: She is not in acute distress.    Appearance: Normal appearance. She is not ill-appearing, toxic-appearing or diaphoretic.  HENT:     Head: Normocephalic.     Right Ear: Tympanic membrane, ear canal and external ear normal. There is no impacted cerumen.     Left Ear: Tympanic membrane, ear canal and external ear normal. There is no impacted cerumen.     Nose: Nose normal.     Mouth/Throat:     Mouth: Mucous membranes are moist.     Pharynx: Oropharynx is clear. No oropharyngeal exudate or posterior oropharyngeal erythema.  Eyes:     General: No scleral icterus.       Right eye: No discharge.        Left eye: No discharge.     Conjunctiva/sclera: Conjunctivae normal.     Pupils: Pupils are equal, round, and reactive to light.  Neck:     Vascular: No  carotid bruit.  Cardiovascular:     Rate and Rhythm: Normal rate and regular rhythm.     Pulses: Normal pulses.     Heart sounds: Normal heart sounds.  Pulmonary:     Effort: Pulmonary effort is normal. No respiratory distress.     Breath sounds: Normal breath sounds.  Abdominal:     General: Abdomen is flat. Bowel sounds are normal.     Palpations: Abdomen is soft.  Musculoskeletal:        General: Normal range of motion.     Cervical back: Normal range of motion.  Skin:    General: Skin is warm and dry.  Neurological:     General: No focal deficit present.     Mental Status: She is alert and oriented to person, place, and time. Mental status is at baseline.  Psychiatric:        Mood and Affect: Mood normal.        Behavior: Behavior normal.        Thought Content: Thought content normal.        Judgment: Judgment normal.    Subsequent Medicare wellness visit   1. Risk factors, based on past  M,S,F - Cardiac Risk Factors include: advanced age (>30men, >47 women)   2.  Physical activities: Dietary issues and exercise activities discussed:      3.  Depression/mood:  Flowsheet Row Office Visit from 11/25/2022 in [facility] Total Score 0        4.  ADL's:    11/24/2022    4:32 PM  In your present state of health, do you have any difficulty performing the following activities:  Hearing? 0  Vision? 0  Difficulty concentrating or making decisions? 1  Comment 4 strokes  Walking or climbing stairs? 1  Comment left side  Dressing or bathing? 0  Doing errands, shopping? 0  Preparing Food and eating ? N  Using the Toilet? N  In the past six months, have you accidently leaked urine? N  Do you have problems with loss of bowel control? N  Managing your Medications? N  Managing your Finances? N  Housekeeping or managing your Housekeeping? N     5.  Fall risk:     05/24/2020    7:45 PM 05/25/2020    8:23 AM 06/24/2020    1:33 PM  10/07/2022    4:08 PM 11/24/2022    4:35 PM  Fall Risk  Falls in the past year?   1 0 0  Was there an injury with Fall?   1 0 0  Was there an injury with Fall? - Comments   stroke    Fall Risk Category Calculator   2 0 0  Fall Risk Category (Retired)   Moderate    (RETIRED) Patient Fall Risk Level High fall risk High fall risk     Fall risk Follow up    Falls evaluation completed Falls evaluation completed     6.  Home safety: No problems identified   7.  Height weight, and visual acuity: height and weight as above, vision/hearing: Vision Screening   Right eye Left eye Both eyes  Without correction 20/30 20/30 20/30   With correction        8.  Counseling: Counseling given: Not Answered    9. Lab orders based on risk factors: Laboratory update will be reviewed   10. Cognitive assessment:        11/24/2022    4:36 PM  6CIT Screen  What Year? 0 points  What month? 0 points  What time? 0 points  Count back from 20 0 points  Months in reverse 0 points  Repeat phrase 0 points  Total Score 0 points     11. Screening: Patient provided with a written and personalized 5-10 year screening schedule in the AVS. Health Maintenance  Topic Date Due   Hepatitis C Screening  Never done   Colon Cancer Screening  Never done   Zoster (Shingles) Vaccine (1 of 2) Never done   DEXA scan (bone density measurement)  Never done   DTaP/Tdap/Td vaccine (2 - Td or Tdap) 09/13/2019   Pneumonia Vaccine (2 of 2 - PPSV23 or PCV20) 01/02/2020   COVID-19 Vaccine (4 - 2023-24 season) 12/11/2022*   Flu Shot  06/13/2023*   Medicare Annual Wellness Visit  11/25/2023   Mammogram  10/13/2024   HPV Vaccine  Aged Out  *Topic was postponed. The date shown is not the original due date.    12. Provider List Update: Patient Care Team    Relationship Specialty Notifications Start End  Philip Aspen, Limmie Patricia, MD PCP - General Internal Medicine  10/07/22   [place], Peter M, MD PCP - Cardiology  Cardiology  07/07/21      13. Advance Directives: Does Patient Have a Medical Advance Directive?: No Would patient like information on creating a medical advance directive?: Yes (Inpatient - patient defers creating a medical advance directive and declines information at this time)  14. Opioids: Patient is not on any opioid prescriptions and has no risk factors for a substance use disorder.   15.   Goals      Become More Active     Timeframe:  Long-Range Goal Priority:  Medium Start Date:                             Expected End Date:                       Follow Up Date 11/24/2023    - go out for a short walk before breakfast, after dinner or both    Why is this important?   It is easy to come up with reasons not to exercise.  These steps will help you get started and have fun doing it.    Notes:          I have personally reviewed and noted the following in  the patient's chart:   Medical and social history Use of alcohol, tobacco or illicit drugs  Current medications and supplements Functional ability and status Nutritional status Physical activity Advanced directives List of other physicians Hospitalizations, surgeries, and ER visits in previous 12 months Vitals Screenings to include cognitive, depression, and falls Referrals and appointments  In addition, I have reviewed and discussed with patient certain preventive protocols, quality metrics, and best practice recommendations. A written personalized care plan for preventive services as well as general preventive health recommendations were provided to patient.   Impression and Plan:  Medicare annual wellness visit, subsequent  Essential hypertension -     CBC with Differential/Platelet; Future -     Comprehensive metabolic panel; Future  Dyslipidemia -     Lipid panel; Future  Cerebrovascular accident (CVA) due to embolism of left vertebral artery (HCC)  Encounter for hepatitis C screening test for  low risk patient -     Hepatitis C antibody; Future  Postmenopausal estrogen deficiency -     VITAMIN D 25 Hydroxy (Vit-D Deficiency, Fractures); Future -     DG Bone Density; Future   -Recommend routine eye and dental care. -Healthy lifestyle discussed in detail. -Labs to be updated today. -Prostate cancer screening: N/A Health Maintenance  Topic Date Due   Hepatitis C Screening  Never done   Colon Cancer Screening  Never done   Zoster (Shingles) Vaccine (1 of 2) Never done   DEXA scan (bone density measurement)  Never done   DTaP/Tdap/Td vaccine (2 - Td or Tdap) 09/13/2019   Pneumonia Vaccine (2 of 2 - PPSV23 or PCV20) 01/02/2020   COVID-19 Vaccine (4 - 2023-24 season) 12/11/2022*   Flu Shot  06/13/2023*   Medicare Annual Wellness Visit  11/25/2023   Mammogram  10/13/2024   HPV Vaccine  Aged Out  *Topic was postponed. The date shown is not the original due date.   -Advised to update all vaccines at pharmacy.      Chaya Jan, MD [facility]

## 2022-11-25 NOTE — Progress Notes (Signed)
Medicare Wellness question were answered 11/24/22 and 11/25/22

## 2022-11-25 NOTE — Patient Instructions (Signed)
-  Nice seeing you today!!  -Lab work today; will notify you once results are available.  -Due for the following vaccines: Tdap, shingles, pneumonia, flu, COVID, RSV.

## 2022-11-26 LAB — HEPATITIS C ANTIBODY: Hepatitis C Ab: NONREACTIVE

## 2022-11-30 ENCOUNTER — Telehealth: Payer: Self-pay | Admitting: Internal Medicine

## 2022-11-30 ENCOUNTER — Ambulatory Visit: Payer: Medicare HMO | Admitting: Podiatry

## 2022-11-30 NOTE — Telephone Encounter (Signed)
States she had a bone density scan September 2023 with [facility]. Was advised by pcp to have another one. Asking does she need it or can she get the records from the previous one

## 2022-11-30 NOTE — Telephone Encounter (Signed)
Left message on machine for patient to return our call

## 2022-11-30 NOTE — Telephone Encounter (Signed)
Pt is calling you back and stated she will wait on a call back from you.

## 2022-12-01 ENCOUNTER — Encounter: Payer: Self-pay | Admitting: *Deleted

## 2022-12-01 ENCOUNTER — Encounter: Payer: Self-pay | Admitting: Pulmonary Disease

## 2022-12-01 ENCOUNTER — Ambulatory Visit: Payer: Medicare HMO | Admitting: Pulmonary Disease

## 2022-12-01 ENCOUNTER — Other Ambulatory Visit: Payer: Self-pay | Admitting: Pulmonary Disease

## 2022-12-01 ENCOUNTER — Encounter: Payer: Self-pay | Admitting: Internal Medicine

## 2022-12-01 ENCOUNTER — Ambulatory Visit: Payer: Medicare HMO | Admitting: Podiatry

## 2022-12-01 VITALS — BP 140/74 | HR 82 | Temp 97.8°F | Ht 63.0 in | Wt 136.0 lb

## 2022-12-01 DIAGNOSIS — M21611 Bunion of right foot: Secondary | ICD-10-CM | POA: Diagnosis not present

## 2022-12-01 DIAGNOSIS — L603 Nail dystrophy: Secondary | ICD-10-CM

## 2022-12-01 DIAGNOSIS — E8801 Alpha-1-antitrypsin deficiency: Secondary | ICD-10-CM

## 2022-12-01 DIAGNOSIS — M2041 Other hammer toe(s) (acquired), right foot: Secondary | ICD-10-CM | POA: Diagnosis not present

## 2022-12-01 DIAGNOSIS — M21612 Bunion of left foot: Secondary | ICD-10-CM | POA: Diagnosis not present

## 2022-12-01 DIAGNOSIS — M2042 Other hammer toe(s) (acquired), left foot: Secondary | ICD-10-CM | POA: Diagnosis not present

## 2022-12-01 MED ORDER — TRELEGY ELLIPTA 200-62.5-25 MCG/ACT IN AEPB: 1.0000 | INHALATION_SPRAY | Freq: Every day | RESPIRATORY_TRACT | Status: DC

## 2022-12-01 MED ORDER — TRELEGY ELLIPTA 200-62.5-25 MCG/ACT IN AEPB: 1.0000 | INHALATION_SPRAY | Freq: Every day | RESPIRATORY_TRACT | 11 refills | Status: DC

## 2022-12-01 NOTE — Telephone Encounter (Signed)
Message sent via MyChart.

## 2022-12-01 NOTE — Progress Notes (Signed)
Subjective:  Patient ID: Jeanette Barnes, female    DOB: 1955/07/08,   MRN: [id number]  Chief Complaint  Patient presents with   Nail Problem    67 y.o. female presents for concern of nail changes on the left fifth toe as well as hammertoes and bunions. Relates her feet have always been different and wanted them evaluated. Also relates she has a nail biopsy on her fifth left toe and negative for fungus but wanted a second opinion on what could be going on.  . Denies any other pedal complaints. Denies n/v/f/c.   Past Medical History:  Diagnosis Date   AAT (alpha-1-antitrypsin) deficiency (HCC)    Anxiety    Heart murmur    mitral valve prolapse   Hypercholesteremia    Hypertension    Pulmonary nodule    Vitamin D deficiency     Objective:  Physical Exam: Vascular: DP/PT pulses 2/4 bilateral. CFT <3 seconds. Normal hair growth on digits. No edema.  Skin. No lacerations or abrasions bilateral feet. Left fifth digit with dark streak noted not into the cuticle  Musculoskeletal: MMT 5/5 bilateral lower extremities in DF, PF, Inversion and Eversion. Deceased ROM in DF of ankle joint. Bilateral mild HAV deformity and flexible hammered digits bilateral.  Neurological: Sensation intact to light touch.   Assessment:   1. Onychodystrophy   2. Bilateral bunions   3. Hammertoe, bilateral      Plan:  Patient was evaluated and treated and all questions answered. -Examined patient -Discussed treatment options for painful dystrophic nails  -Bengin pigmentation of the nail discussed.  -Educated on hammertoes and treatment options  -Discussed padding including toe caps and crest pads.  -Discussed need for potential surgery if pain does not improved.  -Patient to follow-up as needed. Discussed calling if any changes or increased pain.     Louann Sjogren, DPM

## 2022-12-01 NOTE — Patient Instructions (Addendum)
Will check alpha-1 antitrypsin levels today Schedule PFTs Start Trelegy 200 inhaler Follow-up in 3 months  VISIT SUMMARY:  During your visit, we discussed your recent stroke and the symptoms you've been experiencing since, including fatigue, balance issues, difficulty swallowing, and shortness of breath. We also talked about your Alpha 1 Antitrypsin deficiency, your cholesterol management, and the chest pain you've been experiencing. We've made a plan to manage these issues and will continue to monitor your progress.  YOUR PLAN:  -STROKE: You had a stroke in March 2022, which has left you with fatigue, balance issues, and mild difficulty with speech. We will continue with your current management and rehabilitation.  -SHORTNESS OF BREATH: You've been experiencing increased shortness of breath, especially when exerting yourself. This could be related to your Alpha-1 Antitrypsin Deficiency. We will order lung function tests, provide a sample of a once-daily inhaler for you to try, and check your Alpha-1 Antitrypsin levels.  -ALPHA-1 ANTITRYPSIN DEFICIENCY: Your Alpha-1 Antitrypsin Deficiency has been stable for many years, but your recent increase in shortness of breath warrants a reassessment. We will check your Alpha-1 Antitrypsin levels and plan a follow-up in three months to review your lung function tests and lab results.  -CHOLESTEROL MANAGEMENT: Your cholesterol is now below 200, and your liver enzymes have returned to normal since reducing your statin dose. We will continue with your current statin regimen.  -CHEST PAIN: Your chest pain is not related to any cardiac, gastrointestinal, or orthopedic issues. It could be related to anxiety. We will continue with your current management and you will follow-up with your cardiologist as scheduled.  INSTRUCTIONS:  Please make sure to take the once-daily inhaler as instructed and monitor any changes in your shortness of breath. Continue with your  current statin regimen for cholesterol management. If your chest pain persists or worsens, please contact your cardiologist immediately. We will see you again in three months to review your lung function tests and lab results.

## 2022-12-01 NOTE — Progress Notes (Signed)
Jeanette Barnes    [id number]    31-May-1955  Primary Care Physician:Hernandez Priscella Mann, MD  Referring Physician: Macy Mis, MD [address] [place],  [place] [address]  Chief complaint: Follow-up for alpha-1 antitrypsin deficiency.   HPI: Jeanette Barnes is a 67 year old with history of alpha-1 antitrypsin deficiency. This was diagnosed in 2014 at [facility] by Dr. Irineo Axon, Pulmonary with SS phenotype. She is also being followed for subcentimeter pulmonary nodules. Since she is a never smoker just told she does not need a follow-up CT scan. Jeanette Barnes had emphysema but is also heavy smoker. She's had an evaluation at GI in [place] with a normal ultrasound of the liver  In office today she complains of mild dyspnea with activity, nonproductive cough. She has atypical chest pain and has been worked up with a cardiac stress test last year which was normal.  Pets: None Occupation: Secondary school teacher for [employer] Exposures: No known exposures at work or at home Smoking history: No smoking history  Interim history: Discussed the use of AI scribe software for clinical note transcription with the patient, who gave verbal consent to proceed.  The patient, with a history of Alpha 1 Antitrypsin deficiency, presents two years after Jeanette last visit, having suffered a stroke in March 2022. Since the stroke, she has experienced fatigue, shortness of breath, and difficulty swallowing. She reports that Jeanette balance is off, particularly on the left side, and she struggles to find Jeanette words when speaking. The shortness of breath is particularly noticeable when climbing the stairs to Jeanette second-floor residence, to the point where she is 'barely able to get in the door.' She believes this is due to breathing difficulties rather than stroke-related weakness. She also reports a cough, which she attributes to Jeanette swallowing difficulties since the stroke. She has been choking  on both liquids and solids.  In addition to these symptoms, the patient has been dealing with fluctuating liver enzymes due to statin use for cholesterol management. She has since reduced Jeanette statin intake, and Jeanette liver enzymes have returned to normal. Jeanette cholesterol levels have also decreased to below 200.  The patient also reports chest pain, which has been investigated by various specialists, including a gastroenterologist and an orthopedic specialist, with no clear cause identified. A cardiologist has ruled out a cardiac origin for the chest pain. She also suffers from anxiety, which is exacerbated by the use of albuterol for Jeanette Alpha 1 Antitrypsin deficiency.      Outpatient Encounter Medications as of 12/01/2022  Medication Sig   ALPRAZolam (XANAX) 0.5 MG tablet Take 0.5 tablets (0.25 mg total) by mouth as needed for anxiety.   aspirin EC 81 MG tablet Take 1 tablet (81 mg total) by mouth daily. Swallow whole.   b complex vitamins capsule Take 1 capsule by mouth daily.   cholecalciferol (VITAMIN D) 400 units TABS tablet Take 400 Units by mouth.   co-enzyme Q-10 30 MG capsule Take 30 mg by mouth 3 (three) times daily.   famotidine (PEPCID) 20 MG tablet Take 20 mg by mouth as needed.   Fluticasone-Umeclidin-Vilant (TRELEGY ELLIPTA) 200-62.5-25 MCG/ACT AEPB Inhale 1 puff into the lungs daily.   hydrochlorothiazide (HYDRODIURIL) 25 MG tablet Take 1 tablet (25 mg total) by mouth daily.   rosuvastatin (CRESTOR) 10 MG tablet Take by mouth.   vitamin B-12 (CYANOCOBALAMIN) 1000 MCG tablet Take 1,000 mcg by mouth daily.   vitamin E 45  MG (100 UNITS) capsule as needed.   albuterol (VENTOLIN HFA) 108 (90 Base) MCG/ACT inhaler Inhale 2 puffs into the lungs every 6 (six) hours as needed for wheezing or shortness of breath. (Patient not taking: Reported on 12/01/2022)   No facility-administered encounter medications on file as of 12/01/2022.   Physical Exam: Blood pressure 126/76, pulse (!) 106,  temperature 99 F (37.2 C), temperature source Oral, height 5\' 2"  (1.575 m), weight 133 lb 3.2 oz (60.4 kg), SpO2 99 %. Gen:      No acute distress HEENT:  EOMI, sclera anicteric Neck:     No masses; no thyromegaly Lungs:    Clear to auscultation bilaterally; normal respiratory effort CV:         Regular rate and rhythm; no murmurs Abd:      + bowel sounds; soft, non-tender; no palpable masses, no distension Ext:    No edema; adequate peripheral perfusion Skin:      Warm and dry; no rash Neuro: alert and oriented x 3 Psych: normal mood and affect  Data Reviewed: Imaging: HRCT 7/1//15-No Emphysema or ILD. Small Fibrotic Streaks. 3 Small Nodules (Left Apex 3 Mm, Posterior Lingular 3 Mm, Right Lower Lobe 5 Mm. HRCT 11/19/14-Subpleural Groundglass Pulmonary Nodule in the Posterior Lingula [3 Mm] and a at Left Lung Base Adjacent to Hemidiaphragm [5 Mm], Medial Left Lower Lobe Scar, Calcified Left Apical Nodule  CT scan 08/16/16 4 mm subpleural nodule in the left lower lobe. Mild subpleural nodular thickening in the right middle lobe. Chest x-ray 04/14/17-clear lungs.  No acute abnormality I have reviewed the images personally.  CT abdomen pelvis 07/26/2019 ([facility]) The first image on series 3 lung windows reveals a potential medial right lower lobe opacity however this could be a partially imaged normal structure. Lung bases are otherwise clear. Heart size is normal.    CT chest 12/26/2019-no emphysema, stable 4 mm nodule in the periphery of the left lower lobe. I have reviewed the images personally.  PFTs:  07/05/12- no significant obstructive disease, lung volumes within normal limits, DLCO mildly reduced. FEV 2.91 [104% predicted], ratio of 76.  05/24/17 FVC 2.52 [101%], FEV1 1.93 [99%], F/F 76, TLC 89%, DLCO 67%, DLCO/VA 86%  02/05/2020 FVC 2.43 [9 9%), FEV1 1.85 [97%], F/F 76, TLC 4.35 [87%], DLCOcorr 15.35 [81%] No obstruction, lung volumes and diffusion capacity are  normal  Labs: Data from outside [facility] Alpha-1 antitrypsin 09/08/12-SS Phenotype Alpha-1 antitrypsin levels 08/01/15-76. Alpha-1 antitrypsin levels 05/24/2017-88  Alpha-1 antitrypsin 12/06/2019-93, SS phenotype  Hepatic panel 12/06/2019-within normal limits  Assessment:  Alpha-1 antitrypsin deficiency, SS phenotype. Alpha-1 antitrypsin levels and lung function test has remained stable in the past with no evidence of COPD However she reports increasing dyspnea over the past few years Recheck with PFTs Recheck alpha-1 antitrypsin levels Start Trelegy 200 inhaler Recent hepatic panel is normal   Plan/Recommendations: - Albuterol PRN - Start Trelegy - PFTs, alpha-1 antitrypsin levels  Chilton Greathouse MD [facility] 12/01/2022, 10:26 AM  CC: Macy Mis, MD

## 2022-12-02 LAB — ALPHA-1-ANTITRYPSIN: A-1 Antitrypsin, Ser: 94 mg/dL (ref 83–199)

## 2022-12-07 ENCOUNTER — Other Ambulatory Visit (HOSPITAL_BASED_OUTPATIENT_CLINIC_OR_DEPARTMENT_OTHER): Payer: Medicare HMO

## 2022-12-26 NOTE — Progress Notes (Addendum)
Cardiology Office Note:    Date:  01/04/2023   ID:  Jeanette Barnes, DOB 29-Jan-1956, MRN [id number]  PCP:  Philip Aspen, Barnes Patricia, MD  Cardiologist:  Peter Swaziland, MD  Click to update primary MD,subspecialty MD or APP then REFRESH:1}    Referring MD: Macy Mis, MD   Chief Complaint: follow-up of atypical chest pain  History of Present Illness:    Jeanette Barnes is a 67 y.o. 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 Swaziland and presents today for routine follow-up.   Patient was referred to Dr. Swaziland in 05/2017 for further evaluation of chest pain after a recent ED visit. She reported having these symptoms for years which had been evaluated thoroughly evaluated with Echo and nuclear stress tests which were normal. Symptoms were felt to be atypical and no additional cardiac work-up was felt to be necessary at that time. She was referred back to Dr. Swaziland in 11/2019 for exertional chest pain and dyspnea. Coronary CTA was ordered for further evaluation and showed a coronary calcium score of 0 with no evidence of CAD. She was admitted  for a CVA in 05/2020. Echo at that time showed LVEF of 60-65% with normal wall motion, mild LVH, and grade 1 diastolic dysfunction as well a mildly enlarged RV with normal RV function (of note, she was reportedly told she had mitral valve prolapse in her 20s but Echo showed no evidence of prolapse and only trival MR). She underwent image guided diagnostic cerebral arteriogram in IR with Dr. Nicki Reaper on 05/21/2020 that showed occluded origin of left vertebral artery, bilateral fibromuscular dysplastic changes involving the mid cervical internal carotid arteries (Lt>Rt) with 2 small pseudoaneurysms with the  largest one measuring approximately 3.4 x 3.4 mm. Neurology recommended DAPT with Aspirin and Plavix for 3 months followed by Aspirin alone. Outpatient monitor showed no evidence of atrial fibrillation. IR recommended repeat head/ neck CTA in 01/2021 which showed cervical carotid FMD with 4x2 mm left pseudoaneurysm which was stable, 60% atheromatous narrowing at the right V1 segment, and chronic left proximal vertebral occlusion.   She was last seen by Dr. Swaziland in 12/2021 at which time she continued to reported random atypical chest pain at rest but was overall doing well. No additional cardiac work-up was recommended.   Patient presents today for follow-up. She reports intermittent chest pain, described as a raw, abrasive sensation, located substernal and radiating under the left breast. This pain has been occurring for the past two years and is different from previous episodes of chest pain. She states sometimes the pain is so severe that she has considered going to the ED. The pain will typically last for a minute or two and then go away. She reports occasional left jaw and back pain with the chest pain but not all the time. The pain usually occurs at rest; however, she does report some shortness of breath and slight chest pain when she walks up the stairs to her second floor apartment. She also reports some shortness of breath when laying on her right side at night. No other shortness of breath at rest. No orthopnea, PND, or edema. She reports occasional palpitations that seem to mostly be related to her anxiety. No significant lightheadedness/ dizziness but she does report some chronic headaches.  She also reports intermittent numbness down her left arm and leg. Sometimes this is associated with the chest pain that she has but no always. She states she has been having this intermittent numbness since her before her stroke. She also reports chronic headaches. She still follows with a Neurologist and I  recommended following up with them on this.  Of note, she states she has been evaluated by multiple specialist for her symptoms including Pulmonology, GI, and Ortho. She also had a recent mammogram given pain radiates to her left breast. It sounds like everything has been mostly unremarkable. Pulmonology did order PFTs and this is pending.   EKGs/Labs/Other Studies Reviewed:    The following studies were reviewed:  Coronary CTA 12/26/2019: Impressions: 1. Coronary calcium score of 0. This was 0 percentile for age and sex matched control. 2. Normal coronary origin with right dominance. 3. No evidence of CAD; CAD-RADS 0. _______________  Echocardiogram 05/19/2020: Impressions:  1. Left ventricular ejection fraction, by estimation, is 60 to 65%. The  left ventricle has normal function. The left ventricle has no regional  wall motion abnormalities. There is mild concentric left ventricular  hypertrophy with more notable, discrete  thickening of the basal-septal segment. Left ventricular diastolic  parameters are consistent with Grade I diastolic dysfunction (impaired  relaxation).   2. Right ventricular systolic function is normal. The right ventricular  size is mildly enlarged. There is normal pulmonary artery systolic  pressure. The estimated right ventricular systolic pressure is 26.6 mmHg.   3. The mitral valve is grossly normal. Trivial mitral valve  regurgitation.   4. The aortic valve is tricuspid. Aortic valve regurgitation is not  visualized. No aortic stenosis is present.   5. The inferior vena cava is normal in size with greater than 50%  respiratory variability, suggesting right atrial pressure of 3 mmHg.  _______________  Monitor 05/30/2020 to 06/28/2020: Normal sinus rhythm One 4 beat run NSVT Otherwise normal monitor. No atrial fibrillation  _______________  Head/ Neck CTA 01/22/2021: Impressions: 1. Cervical carotid FMD with 4 x 2 mm left pseudoaneurysm which  is non progressed. 2. 60% atheromatous narrowing at the right V1 segment. 3. Chronic left proximal vertebral occlusion with downstream reconstitution.  EKG:  EKG ordered today.   EKG Interpretation Date/Time:  Tuesday January 04 2023 13:37:58 EDT Ventricular Rate:  65 PR Interval:  150 QRS Duration:  72 QT Interval:  408 QTC Calculation: 424 R Axis:   -8  Text Interpretation: Normal sinus rhythm Possible Left atrial enlargement Possible Septal infarct , age undetermined No acute ischemic changes Confirmed by Marjie Skiff [phone]) on 01/04/2023 10:12:53 PM    Recent Labs: 11/25/2022: ALT 25; BUN 16; Creatinine, Ser 0.68; Hemoglobin 13.3; Platelets 216.0; Potassium 3.6; Sodium 141  Recent Lipid Panel    Component Value Date/Time   CHOL 193 11/25/2022 1435   TRIG 144.0 11/25/2022 1435   HDL 64.60 11/25/2022 1435   CHOLHDL 3 11/25/2022 1435   VLDL 28.8 11/25/2022 1435   LDLCALC 99 11/25/2022 1435    Physical Exam:    Vital Signs: BP (!) 156/71 (BP Location: Left Arm, Patient Position: Sitting, Cuff Size: Normal)   Pulse 65   Ht 5\' 2"  (1.575 m)   Wt 137 lb (62.1 kg)   SpO2 96%   BMI 25.06 kg/m     Wt Readings from Last 3 Encounters:  01/04/23 137 lb (62.1 kg)  12/01/22 136 lb (61.7 kg)  11/25/22 135 lb 6.4 oz (61.4 kg)  General: 67 y.o. African-American female in no acute distress. HEENT: Normocephalic and atraumatic. Sclera clear.  Neck: Supple. No carotid bruits. No JVD. Heart: RRR. Distinct S1 and S2. No murmurs, gallops, or rubs.  Lungs: No increased work of breathing. Clear to ausculation bilaterally. No wheezes, rhonchi, or rales.  Extremities: No lower extremity edema.   Skin: Warm and dry. Neuro: No focal deficits. Psych: Normal affect. Responds appropriately.   Assessment:    1. Chest pain of uncertain etiology   2. Essential hypertension   3. Hyperlipidemia LDL goal <100   4. Fibromuscular dysplasia of carotid artery (HCC)   5. History of  chest pain     Plan:    Chest Pain Patient has a long history of atypical chest pain. Coronary CTA in 12/2019 showed a coronary calcium score of 0 with no evidence of CAD.  - Patient reports intermittent chest pain for the past 2 years that feel different than her prior chest pain. Please see HPI for more details.  - EKG shows possible septal infarct but no acute ischemic changes.  - Will add Amlodipine 5mg  daily given need for additional BP control and its antianginal affect (chose this over Imdur given history of headaches).  - Chest pain sounds mostly atypical but she does describe some exertional chest pain and dyspnea as well. Discussed repeat coronary CTA vs cardiac PET stress test. Patient would like to proceed with a cardiac PET stress test given its ability to assess for microvascular disease.   Shared Decision Making/Informed Consent{ The risks [chest pain, shortness of breath, cardiac arrhythmias, dizziness, blood pressure fluctuations, myocardial infarction, stroke/transient ischemic attack, nausea, vomiting, allergic reaction, radiation exposure, metallic taste sensation and life-threatening complications (estimated to be 1 in 10,000)], benefits (risk stratification, diagnosing coronary artery disease, treatment guidance) and alternatives of a cardiac PET stress test were discussed in detail with Ms. Cruey and she agrees to proceed.   Hypertension BP initially elevated at 156/71. However, improved to 122/72 on my personal recheck at the end of visit. Patient states BP is usually >130/80 at home.  - Continue HCTZ 25mg  daily.  - Will add Amlodipine 5mg  daily.   Hyperlipidemia Lipid panel in 11/2022: Total Cholesterol 193, Triglycerides 144, HDL 64, LDL 99. LDL goal <70 given CAD. - Continue Crestor 10mg  three times weekly. Unable to tolerate higher dose of statins or daily statin use.  - Recommended Zetia vs PCSK9 inhibitor but patient declined and would like to continue current  dose of Crestor for now.   Carotid Fibromuscular Dysplasia Initially diagnosed during admission in 05/2020 for CVA. Last head/neck CTA in 01/2021 showed  showed cervical carotid FMD with 4x2 mm left pseudoaneurysm which was stable, 60% atheromatous narrowing at the right V1 segment, and chronic left proximal vertebral occlusion.  - Patient states no one is routine monitoring.  - Will discuss with Dr. Swaziland about monitoring with head/neck CTA vs carotid ultrasound.  - It does nol look like patient has every had renal artery evaluated which is another common place for FMD so will also recommend abdominal/ pelvic CTA.   History of CVA  History of CVA in 05/2020.' - She reports intermittent left arm and left numbness since before her stroke. - Continue aspirin and statin. - Followed by Neurology at [facility]. Recommended discussing intermittent left sided numbness with them.   Disposition: Follow up with me after cardiac PET.   Signed, Corrin Parker, PA-C  01/04/2023 10:31 PM    [facility]  ADDENDUM 01/23/2023: Discussed repeat imaging options for monitoring of FMD with Dr. Swaziland. He recommended repeat a head/ neck CTA and checking renal artery dopplers. Will notify patient of this recommendation and place orders.   Corrin Parker, PA-C 01/23/2023 9:20 PM

## 2022-12-29 ENCOUNTER — Encounter: Payer: Self-pay | Admitting: Internal Medicine

## 2022-12-29 DIAGNOSIS — I63212 Cerebral infarction due to unspecified occlusion or stenosis of left vertebral arteries: Secondary | ICD-10-CM

## 2022-12-29 DIAGNOSIS — I63112 Cerebral infarction due to embolism of left vertebral artery: Secondary | ICD-10-CM

## 2023-01-04 ENCOUNTER — Encounter: Payer: Self-pay | Admitting: Student

## 2023-01-04 ENCOUNTER — Ambulatory Visit: Payer: Medicare HMO | Attending: Student | Admitting: Student

## 2023-01-04 VITALS — BP 156/71 | HR 65 | Ht 62.0 in | Wt 137.0 lb

## 2023-01-04 DIAGNOSIS — R079 Chest pain, unspecified: Secondary | ICD-10-CM | POA: Diagnosis not present

## 2023-01-04 DIAGNOSIS — I773 Arterial fibromuscular dysplasia: Secondary | ICD-10-CM | POA: Diagnosis not present

## 2023-01-04 DIAGNOSIS — I10 Essential (primary) hypertension: Secondary | ICD-10-CM

## 2023-01-04 DIAGNOSIS — E785 Hyperlipidemia, unspecified: Secondary | ICD-10-CM | POA: Diagnosis not present

## 2023-01-04 DIAGNOSIS — Z87898 Personal history of other specified conditions: Secondary | ICD-10-CM | POA: Diagnosis not present

## 2023-01-04 MED ORDER — AMLODIPINE BESYLATE 5 MG PO TABS
5.0000 mg | ORAL_TABLET | Freq: Every day | ORAL | 3 refills | Status: AC
Start: 2023-01-04 — End: 2023-11-29

## 2023-01-04 NOTE — Patient Instructions (Signed)
Medication Instructions:  START AMLODIPINE 5MG  *If you need a refill on your cardiac medications before your next appointment, please call your pharmacy*  Lab Work: NONE  Testing/Procedures: YOUR PROVIDER WOULD LIKE YOU TO HAVE A CARDIAC PET SCAN-SEE BELOW  Follow-Up: At [facility], you and your health needs are our priority.  As part of our continuing mission to provide you with exceptional heart care, we have created designated Provider Care Teams.  These Care Teams include your primary Cardiologist (physician) and Advanced Practice Providers (APPs -  Physician Assistants and Nurse Practitioners) who all work together to provide you with the care you need, when you need it.  We recommend signing up for the patient portal called "MyChart".  Sign up information is provided on this After Visit Summary.  MyChart is used to connect with patients for Virtual Visits (Telemedicine).  Patients are able to view lab/test results, encounter notes, upcoming appointments, etc.  Non-urgent messages can be sent to your provider as well.   To learn more about what you can do with MyChart, go to [email or web address].    Your next appointment:   AFTER PET SCAN   Provider:   Peter Swaziland, MD  or Marjie Skiff, PA-C           How to Prepare for Your Cardiac PET/CT Stress Test:  1. Please do not take these medications before your test:   Medications that may interfere with the cardiac pharmacological stress agent (ex. nitrates - including erectile dysfunction medications, isosorbide mononitrate, tamulosin or beta-blockers) the day of the exam. (Erectile dysfunction medication should be held for at least 72 hrs prior to test) Theophylline containing medications for 12 hours. Dipyridamole 48 hours prior to the test. Your remaining medications may be taken with water.  2. Nothing to eat or drink, except water, 3 hours prior to arrival time.   NO caffeine/decaffeinated products, or  chocolate 12 hours prior to arrival.  3. NO perfume, cologne or lotion on chest or abdomen area.          - FEMALES - Please avoid wearing dresses to this appointment.  4. Total time is 1 to 2 hours; you may want to bring reading material for the waiting time.  5. Please report to Radiology at the [facility] Main Entrance 30 minutes early for your test.  [address] [place], [place] [address]  6. Please report to Radiology at [facility] Main Entrance, medical mall, 30 mins prior to your test.  [address]  [place], [place]  [phone]  Diabetic Preparation:  Hold oral medications. You may take NPH and Lantus insulin. Do not take Humalog or Humulin R (Regular Insulin) the day of your test. Check blood sugars prior to leaving the house. If able to eat breakfast prior to 3 hour fasting, you may take all medications, including your insulin, Do not worry if you miss your breakfast dose of insulin - start at your next meal. Patients who wear a continuous glucose monitor MUST remove the device prior to scanning.  IF YOU THINK YOU MAY BE PREGNANT, OR ARE NURSING PLEASE INFORM THE TECHNOLOGIST.  In preparation for your appointment, medication and supplies will be purchased.  Appointment availability is limited, so if you need to cancel or reschedule, please call the Radiology Department at [phone] [facility]) OR [phone] [facility])  24 hours in advance to avoid a cancellation fee of $100.00  What to Expect After you Arrive:  Once  you arrive and check in for your appointment, you will be taken to a preparation room within the Radiology Department.  A technologist or Nurse will obtain your medical history, verify that you are correctly prepped for the exam, and explain the procedure.  Afterwards,  an IV will be started in your arm and electrodes will be placed on your skin for EKG monitoring during the stress portion of the exam. Then you  will be escorted to the PET/CT scanner.  There, staff will get you positioned on the scanner and obtain a blood pressure and EKG.  During the exam, you will continue to be connected to the EKG and blood pressure machines.  A small, safe amount of a radioactive tracer will be injected in your IV to obtain a series of pictures of your heart along with an injection of a stress agent.    After your Exam:  It is recommended that you eat a meal and drink a caffeinated beverage to counter act any effects of the stress agent.  Drink plenty of fluids for the remainder of the day and urinate frequently for the first couple of hours after the exam.  Your doctor will inform you of your test results within 7-10 business days.  For more information and frequently asked questions, please visit our website : [email or web address]  For questions about your test or how to prepare for your test, please call: Cardiac Imaging Nurse Navigators Office: [phone]

## 2023-01-23 ENCOUNTER — Telehealth: Payer: Self-pay | Admitting: Student

## 2023-01-23 DIAGNOSIS — I773 Arterial fibromuscular dysplasia: Secondary | ICD-10-CM

## 2023-01-23 NOTE — Telephone Encounter (Signed)
   I recently saw patient in the office on 01/04/2023. I discussed best imaging options for her fibromuscular dysplasia with Dr. Swaziland. He recommended getting a repeat head/ neck CTA for further evaluation of her carotid fibromuscular dysplasia and pseudoaneurysm. He also recommended checking renal artery ultrasounds to make sure she does not have any involvement of her FMD in the renal arteries. She will need a repeat BMET prior to head/ neck CTA.  Marcelino Duster, can you please notify patient (I gave her a heads up on this at our visit so it should not be a surprise) and help place the orders?  Thank you so much! Katelynd Blauvelt

## 2023-01-24 NOTE — Telephone Encounter (Signed)
Left message to call back  

## 2023-01-25 NOTE — Telephone Encounter (Signed)
Patient identification verified by 2 forms. Marilynn Rail, RN    Called and spoke to patient  Relayed provider message below  Patient states she will present to lab tomorrow  Patient verbalized understanding, no questions at this time

## 2023-01-26 ENCOUNTER — Other Ambulatory Visit: Payer: Self-pay

## 2023-01-26 DIAGNOSIS — I773 Arterial fibromuscular dysplasia: Secondary | ICD-10-CM | POA: Diagnosis not present

## 2023-01-27 LAB — BASIC METABOLIC PANEL
BUN/Creatinine Ratio: 23 (ref 12–28)
BUN: 16 mg/dL (ref 8–27)
CO2: 26 mmol/L (ref 20–29)
Calcium: 9.4 mg/dL (ref 8.7–10.3)
Chloride: 101 mmol/L (ref 96–106)
Creatinine, Ser: 0.69 mg/dL (ref 0.57–1.00)
Glucose: 101 mg/dL — ABNORMAL HIGH (ref 70–99)
Potassium: 3.5 mmol/L (ref 3.5–5.2)
Sodium: 141 mmol/L (ref 134–144)
eGFR: 94 mL/min/{1.73_m2} (ref >59)

## 2023-02-07 ENCOUNTER — Ambulatory Visit (HOSPITAL_COMMUNITY)
Admission: RE | Admit: 2023-02-07 | Discharge: 2023-02-07 | Disposition: A | Discharge status: Home / Self Care | Payer: Medicare HMO | Source: Ambulatory Visit | Attending: Student | Admitting: Student

## 2023-02-07 DIAGNOSIS — I6503 Occlusion and stenosis of bilateral vertebral arteries: Secondary | ICD-10-CM | POA: Diagnosis not present

## 2023-02-07 DIAGNOSIS — I639 Cerebral infarction, unspecified: Secondary | ICD-10-CM | POA: Diagnosis not present

## 2023-02-07 DIAGNOSIS — I773 Arterial fibromuscular dysplasia: Secondary | ICD-10-CM | POA: Diagnosis not present

## 2023-02-07 MED ORDER — IOHEXOL 350 MG/ML SOLN
75.0000 mL | Freq: Once | INTRAVENOUS | Status: AC | PRN
  Administered 2023-02-07: 75 mL via INTRAVENOUS

## 2023-02-18 ENCOUNTER — Ambulatory Visit (HOSPITAL_COMMUNITY)
Admission: RE | Admit: 2023-02-18 | Discharge: 2023-02-18 | Disposition: A | Discharge status: Home / Self Care | Payer: Medicare HMO | Source: Ambulatory Visit | Attending: Cardiology | Admitting: Cardiology

## 2023-02-18 ENCOUNTER — Encounter (HOSPITAL_COMMUNITY): Payer: Self-pay

## 2023-02-18 DIAGNOSIS — I773 Arterial fibromuscular dysplasia: Secondary | ICD-10-CM | POA: Insufficient documentation

## 2023-02-23 ENCOUNTER — Encounter (HOSPITAL_COMMUNITY): Admission: RE | Admit: 2023-02-23 | Payer: Medicare HMO | Source: Ambulatory Visit

## 2023-03-02 DIAGNOSIS — H52203 Unspecified astigmatism, bilateral: Secondary | ICD-10-CM | POA: Diagnosis not present

## 2023-03-03 ENCOUNTER — Encounter: Payer: Self-pay | Admitting: *Deleted

## 2023-03-03 ENCOUNTER — Ambulatory Visit: Payer: Medicare HMO | Admitting: Pulmonary Disease

## 2023-03-03 ENCOUNTER — Ambulatory Visit: Payer: Medicare HMO

## 2023-03-03 ENCOUNTER — Encounter: Payer: Self-pay | Admitting: Pulmonary Disease

## 2023-03-03 VITALS — BP 127/71 | HR 81 | Temp 97.3°F | Ht 63.5 in | Wt 141.0 lb

## 2023-03-03 DIAGNOSIS — E8801 Alpha-1-antitrypsin deficiency: Secondary | ICD-10-CM

## 2023-03-03 DIAGNOSIS — R0600 Dyspnea, unspecified: Secondary | ICD-10-CM | POA: Diagnosis not present

## 2023-03-03 LAB — PULMONARY FUNCTION TEST
DL/VA % pred: 86 %
DL/VA: 3.62 ml/min/mmHg/L
DLCO cor % pred: 70 %
DLCO cor: 13.6 ml/min/mmHg
DLCO unc % pred: 70 %
DLCO unc: 13.6 ml/min/mmHg
FEF 25-75 Post: 1.92 L/s
FEF 25-75 Pre: 1.53 L/s
FEF2575-%Change-Post: 25 %
FEF2575-%Pred-Post: 100 %
FEF2575-%Pred-Pre: 79 %
FEV1-%Change-Post: 4 %
FEV1-%Pred-Post: 82 %
FEV1-%Pred-Pre: 78 %
FEV1-Post: 1.85 L
FEV1-Pre: 1.76 L
FEV1FVC-%Change-Post: -1 %
FEV1FVC-%Pred-Pre: 101 %
FEV6-%Change-Post: 6 %
FEV6-%Pred-Post: 85 %
FEV6-%Pred-Pre: 79 %
FEV6-Post: 2.42 L
FEV6-Pre: 2.27 L
FEV6FVC-%Pred-Post: 104 %
FEV6FVC-%Pred-Pre: 104 %
FVC-%Change-Post: 5 %
FVC-%Pred-Post: 81 %
FVC-%Pred-Pre: 76 %
FVC-Post: 2.42 L
FVC-Pre: 2.29 L
Post FEV1/FVC ratio: 76 %
Post FEV6/FVC ratio: 100 %
Pre FEV1/FVC ratio: 77 %
Pre FEV6/FVC Ratio: 100 %
RV % pred: 103 %
RV: 2.22 L
TLC % pred: 93 %
TLC: 4.67 L

## 2023-03-03 NOTE — Patient Instructions (Signed)
Full PFT performed today. °

## 2023-03-03 NOTE — Patient Instructions (Signed)
VISIT SUMMARY:  You came in today because you have been experiencing increased shortness of breath, especially when climbing stairs or doing housework. We discussed your current symptoms and reviewed your history of fibromuscular dysplasia and alpha 1 antidepressant deficiency. You mentioned that the Trelegy inhaler has not been helping and causes you to cough. We have decided to make some changes to your treatment plan to help address your symptoms.  YOUR PLAN:  -SHORTNESS OF BREATH: Shortness of breath can be caused by various conditions, and in your case, it has worsened with exertion and housework. Since the Trelegy inhaler has not been effective and causes coughing, we will discontinue its use. We will perform a chest x-ray today to look for other possible causes. If your shortness of breath continues, we may consider a cardiopulmonary exercise test after evaluating your heart health.  -FIBROMUSCULAR DYSPLASIA: Fibromuscular dysplasia is a condition that causes abnormal growth in the walls of your arteries, which can lead to blockages. You have a history of strokes and carotid artery blockage due to this condition. We will continue with your current management plan and follow up with your cardiologist. We are also waiting for the results of your upcoming cardiac PET scan to get more information.  -LIVING SITUATION: You have difficulty with exertion because you live on the second floor of an apartment complex. We will provide you with a letter to request a move to a first-floor apartment to help reduce the strain on your breathing.  INSTRUCTIONS:  Please get a chest x-ray today as discussed. Follow up with your cardiologist as planned and await the results of your PET scan. We will see you again in six months, or sooner if needed based on the results of your cardiac evaluation or chest x-ray.

## 2023-03-03 NOTE — Progress Notes (Signed)
Jeanette Barnes    [id number]    11/26/1955  Primary Care Physician:Hernandez Priscella Mann, MD  Referring Physician: Philip Aspen, Limmie Patricia, MD [address] [place],  [place] [address]  Chief complaint: Follow-up for alpha-1 antitrypsin deficiency.   HPI: Jeanette Barnes is a 67 year old with history of alpha-1 antitrypsin deficiency. This was diagnosed in 2014 at [facility] by Dr. Irineo Axon, Pulmonary with SS phenotype. She is also being followed for subcentimeter pulmonary nodules. Since she is a never smoker just told she does not need a follow-up CT scan. Her father had emphysema but is also heavy smoker. She's had an evaluation at GI in [place] with a normal ultrasound of the liver  In office today she complains of mild dyspnea with activity, nonproductive cough. She has atypical chest pain and has been worked up with a cardiac stress test last year which was normal.  Pets: None Occupation: Secondary school teacher for [employer] Exposures: No known exposures at work or at home Smoking history: No smoking history  Interim history: Discussed the use of AI scribe software for clinical note transcription with the patient, who gave verbal consent to proceed.  The patient, with a history of fibromuscular dysplasia (FMD) and alpha 1 antidepressant deficiency, presents with increased shortness of breath, particularly when climbing stairs or doing housework. She reports that this is a change from her baseline. She was previously started on Trelegy inhaler, but she reports that it causes coughing and she has not noticed any improvement in her breathing. She does not use it daily. 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 She is scheduled for a PET scan at the end of the month due to chronic chest pain and concerns about her vessels.    Outpatient Encounter Medications as of 03/03/2023  Medication Sig   ALPRAZolam  (XANAX) 0.5 MG tablet Take 0.5 tablets (0.25 mg total) by mouth as needed for anxiety.   amLODipine (NORVASC) 5 MG tablet Take 1 tablet (5 mg total) by mouth daily.   aspirin EC 81 MG tablet Take 1 tablet (81 mg total) by mouth daily. Swallow whole.   b complex vitamins capsule Take 1 capsule by mouth daily.   cholecalciferol (VITAMIN D) 400 units TABS tablet Take 400 Units by mouth.   co-enzyme Q-10 30 MG capsule Take 30 mg by mouth 3 (three) times daily.   famotidine (PEPCID) 20 MG tablet Take 20 mg by mouth as needed.   Fluticasone-Umeclidin-Vilant (TRELEGY ELLIPTA) 200-62.5-25 MCG/ACT AEPB Inhale 1 puff into the lungs daily.   hydrochlorothiazide (HYDRODIURIL) 25 MG tablet Take 1 tablet (25 mg total) by mouth daily.   rosuvastatin (CRESTOR) 10 MG tablet Take by mouth.   vitamin B-12 (CYANOCOBALAMIN) 1000 MCG tablet Take 1,000 mcg by mouth daily.   vitamin E 45 MG (100 UNITS) capsule as needed.   albuterol (VENTOLIN HFA) 108 (90 Base) MCG/ACT inhaler Inhale 2 puffs into the lungs every 6 (six) hours as needed for wheezing or shortness of breath.   Fluticasone-Umeclidin-Vilant (TRELEGY ELLIPTA) 200-62.5-25 MCG/ACT AEPB Inhale 1 puff into the lungs daily.   No facility-administered encounter medications on file as of 03/03/2023.   Physical Exam: Blood pressure 127/71, pulse 81, temperature (!) 97.3 F (36.3 C), temperature source Oral, height 5' 3.5" (1.613 m), weight 141 lb (64 kg), SpO2 100%. Gen:      No acute distress HEENT:  EOMI, sclera anicteric  Neck:     No masses; no thyromegaly Lungs:    Clear to auscultation bilaterally; normal respiratory effort CV:         Regular rate and rhythm; no murmurs Abd:      + bowel sounds; soft, non-tender; no palpable masses, no distension Ext:    No edema; adequate peripheral perfusion Skin:      Warm and dry; no rash Neuro: alert and oriented x 3 Psych: normal mood and affect   Data Reviewed: Imaging: HRCT 7/1//15-No Emphysema or ILD.  Small Fibrotic Streaks. 3 Small Nodules (Left Apex 3 Mm, Posterior Lingular 3 Mm, Right Lower Lobe 5 Mm. HRCT 11/19/14-Subpleural Groundglass Pulmonary Nodule in the Posterior Lingula [3 Mm] and a at Left Lung Base Adjacent to Hemidiaphragm [5 Mm], Medial Left Lower Lobe Scar, Calcified Left Apical Nodule  CT scan 08/16/16 4 mm subpleural nodule in the left lower lobe. Mild subpleural nodular thickening in the right middle lobe. Chest x-ray 04/14/17-clear lungs.  No acute abnormality I have reviewed the images personally.  CT abdomen pelvis 07/26/2019 ([facility]) The first image on series 3 lung windows reveals a potential medial right lower lobe opacity however this could be a partially imaged normal structure. Lung bases are otherwise clear. Heart size is normal.    CT chest 12/26/2019-no emphysema, stable 4 mm nodule in the periphery of the left lower lobe. I have reviewed the images personally.  PFTs:  07/05/12- no significant obstructive disease, lung volumes within normal limits, DLCO mildly reduced. FEV 2.91 [104% predicted], ratio of 76.  05/24/17 FVC 2.52 [101%], FEV1 1.93 [99%], F/F 76, TLC 89%, DLCO 67%, DLCO/VA 86%  02/05/2020 FVC 2.43 [9 9%), FEV1 1.85 [97%], F/F 76, TLC 4.35 [87%], DLCOcorr 15.35 [81%] No obstruction, lung volumes and diffusion capacity are normal  03/03/2023 FVC 2.42 [81%], FEV1 1.85 [80%], F/F76, TLC 4.67 [93%], DLCO 13.60 [70%] Mild diffusion defect  Labs: Data from outside [facility] Alpha-1 antitrypsin 09/08/12-SS Phenotype Alpha-1 antitrypsin levels 08/01/15-76. Alpha-1 antitrypsin levels 05/24/2017-88 Alpha-1 antitrypsin 12/06/2019-93, SS phenotype Alpha-1 antitrypsin levels 12/01/2022-94  Hepatic panel 11/25/2022-within normal limits  Assessment:  Alpha-1 antitrypsin deficiency, SS phenotype. Increased with exertion and housework. No improvement with Trelegy inhaler, which also causes cough. CT scan and PFTs do not show evidence of COPD. Slight reduction  in lung capacity noted, but cause is unclear.  Recent alpha-1 antitrypsin levels and LFTs are normal  -Discontinue Trelegy inhaler due to lack of improvement and side effects. -Order chest x-ray today to rule out other causes of shortness of breath. -Consider cardiopulmonary exercise test after cardiac evaluation, if shortness of breath persists.  Fibromuscular Dysplasia History of four strokes and carotid artery blockage. -Continue current management and follow-up with cardiologist. -Await results of upcoming cardiac PET perfusion scan.  Living Situation Difficulty with exertion due to living on the second floor of an apartment complex. -Provide letter for patient to request a first-floor apartment.  Follow-up in six months, or sooner if cardiac evaluation or chest x-ray results warrant.   Plan/Recommendations: CXR   Chilton Greathouse MD [facility] 03/03/2023, 2:01 PM  CC: Philip Aspen, Estel*

## 2023-03-03 NOTE — Progress Notes (Unsigned)
Full PFT performed today. °

## 2023-03-07 ENCOUNTER — Ambulatory Visit: Payer: Medicare HMO | Admitting: Student

## 2023-03-08 ENCOUNTER — Other Ambulatory Visit: Payer: Self-pay | Admitting: *Deleted

## 2023-03-08 MED ORDER — ROSUVASTATIN CALCIUM 10 MG PO TABS: 10.0000 mg | ORAL_TABLET | Freq: Every day | ORAL | 1 refills | Status: DC

## 2023-03-08 MED ORDER — HYDROCHLOROTHIAZIDE 25 MG PO TABS: 25.0000 mg | ORAL_TABLET | Freq: Every day | ORAL | 1 refills | Status: DC

## 2023-03-11 ENCOUNTER — Encounter (HOSPITAL_COMMUNITY): Payer: Self-pay

## 2023-03-15 ENCOUNTER — Encounter (HOSPITAL_COMMUNITY): Payer: Self-pay

## 2023-03-15 ENCOUNTER — Encounter (HOSPITAL_COMMUNITY)
Admission: RE | Admit: 2023-03-15 | Discharge: 2023-03-15 | Disposition: A | Discharge status: Home / Self Care | Payer: Medicare HMO | Source: Ambulatory Visit | Attending: Student | Admitting: Student

## 2023-03-15 DIAGNOSIS — Z87898 Personal history of other specified conditions: Secondary | ICD-10-CM | POA: Insufficient documentation

## 2023-03-15 DIAGNOSIS — R079 Chest pain, unspecified: Secondary | ICD-10-CM | POA: Insufficient documentation

## 2023-03-15 MED ORDER — REGADENOSON 0.4 MG/5ML IV SOLN
INTRAVENOUS | Status: AC
  Filled 2023-03-15: qty 5

## 2023-03-15 MED ORDER — REGADENOSON 0.4 MG/5ML IV SOLN: 0.4000 mg | Freq: Once | INTRAVENOUS | Status: DC

## 2023-03-17 ENCOUNTER — Encounter: Payer: Self-pay | Admitting: Pulmonary Disease

## 2023-03-22 ENCOUNTER — Encounter (HOSPITAL_COMMUNITY)
Admission: RE | Admit: 2023-03-22 | Discharge: 2023-03-22 | Disposition: A | Discharge status: Home / Self Care | Payer: Medicare Other | Source: Ambulatory Visit | Attending: Student | Admitting: Student

## 2023-03-22 DIAGNOSIS — Z87898 Personal history of other specified conditions: Secondary | ICD-10-CM | POA: Diagnosis not present

## 2023-03-22 DIAGNOSIS — R079 Chest pain, unspecified: Secondary | ICD-10-CM | POA: Insufficient documentation

## 2023-03-22 LAB — NM PET CT CARDIAC PERFUSION MULTI W/ABSOLUTE BLOODFLOW
LV dias vol: 51 mL (ref 46–106)
MBFR: 2.71
Nuc Rest EF: 73 %
Nuc Stress EF: 75 %
Peak HR: 116 {beats}/min
Rest HR: 88 {beats}/min
Rest MBF: 1.26 ml/g/min
Rest Nuclear Isotope Dose: 17.4 mCi
Rest perfusion cavity size (mL): 73 mL
ST Depression (mm): 0 mm
Stress MBF: 3.42 ml/g/min
Stress Nuclear Isotope Dose: 17.4 mCi
Stress perfusion cavity size (mL): 75 mL
TID: 1.1

## 2023-03-22 MED ORDER — REGADENOSON 0.4 MG/5ML IV SOLN
0.4000 mg | Freq: Once | INTRAVENOUS | Status: AC
Start: 2023-03-22 — End: 2023-03-22
  Administered 2023-03-22: 0.4 mg via INTRAVENOUS

## 2023-03-22 MED ORDER — RUBIDIUM RB82 GENERATOR (RUBYFILL)
20.6500 | PACK | Freq: Once | INTRAVENOUS | Status: AC
  Administered 2023-03-22: 17.37 via INTRAVENOUS

## 2023-03-22 MED ORDER — RUBIDIUM RB82 GENERATOR (RUBYFILL)
17.3700 | PACK | Freq: Once | INTRAVENOUS | Status: AC
  Administered 2023-03-22: 17.37 via INTRAVENOUS

## 2023-03-22 MED ORDER — REGADENOSON 0.4 MG/5ML IV SOLN
INTRAVENOUS | Status: AC
  Filled 2023-03-22: qty 5

## 2023-03-23 NOTE — Progress Notes (Signed)
 Cardiology Office Note:    Date:  03/29/2023   ID:  Jeanette Barnes, DOB 03-09-1956, MRN [id number]  PCP:  Theophilus Andrews, Tully GRADE, MD   [facility] Providers Cardiologist:  Kolleen Ochsner, MD     Referring MD: Theophilus Andrews, Estel*   Chief Complaint  Patient presents with   Chest Pain    History of Present Illness:    Jeanette Barnes is a 68 y.o. female with a hx of alpha 1 antitrypsin deficiency, hypertension and hyperlipidemia.  Patient was previously seen for atypical chest discomfort.  She was told in her 98s that she had mitral valve prolapse.  Over the years, she had echocardiogram and nuclear stress test performed in [place] , last was in 2017.  Patient was seen  on 12/07/2019, due to her history of chest pain and dyspnea on exertion, a coronary CT was ordered and performed on 12/26/2019 which showed coronary calcium  score of 0, no evidence of CAD.  Noncardiac portion also did not reveal any acute finding.    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  She was outside the tPA window on arrival.  She underwent image guided diagnostic cerebral arteriogram by Dr. Monna of interventional radiology on 05/21/2020 that showed occluded origin of the left vertebral artery, bilateral fibromuscular dysplastic changes involving the mid cervical internal carotid arteries with 2 small pseudoaneurysms with the largest one measuring 3.4 x 3.4 mm.  Echocardiogram obtained during the same admission demonstrated EF 60 to 65%, mild LVH, grade 1 DD, RVSP 26.6 mmHg, trivial MR.  Neurology service recommended to antiplatelet therapy with aspirin  and Plavix  for 3 months followed by aspirin  alone afterward.  Interventional radiology recommended 84-month CTA of head and neck.  A 30-day preventives monitor was performed in  April 2022 which showed single 4 beat run of nonsustained VT, otherwise normal sinus rhythm, no atrial fibrillation was noted.  Since discharge, patient has been followed by [facility] neurology service.  On follow-up with neurology service, it appears patient has self discontinued all stroke medication.  325 mg daily of aspirin  was restarted.  She was seen by Scot Ford PA-C in April for evaluation of intermittent chest pain.  This is the same chest discomfort she has experienced over the past several years. Given the fact that she had a clean coronary previous coronary CT in October 2021 no further work up recommended.    She was seen by Aline Door PA-C in October with complaints of chest pain. PET CT stress was performed and was normal. Calcium  score was still 0.   On follow up today she notes she is feeling great. She follows a Vegan diet. Has very mild DOE. No further chest pain. Taking Crestor  3 days a week- can't tolerate more. LFTs normal.    Past Medical History:  Diagnosis Date   AAT (alpha-1-antitrypsin) deficiency (HCC)    Anxiety    Heart murmur    mitral valve prolapse   Hypercholesteremia    Hypertension    Pulmonary nodule    Vitamin D  deficiency     Past Surgical History:  Procedure Laterality Date   ABDOMINAL HYSTERECTOMY     BREAST BIOPSY     BREAST EXCISIONAL BIOPSY Bilateral 1990's   2 Left, 1 Right, all benign   IR ANGIO EXTERNAL CAROTID SEL EXT CAROTID BILAT MOD  SED  05/21/2020   IR ANGIO VERTEBRAL SEL SUBCLAVIAN INNOMINATE UNI L MOD SED  05/21/2020   IR ANGIO VERTEBRAL SEL VERTEBRAL UNI R MOD SED  05/21/2020   IR US  GUIDE VASC ACCESS RIGHT  05/21/2020    Current Medications: Current Meds  Medication Sig   ALPRAZolam  (XANAX ) 0.5 MG tablet Take 0.5 tablets (0.25 mg total) by mouth as needed for anxiety.   amLODipine  (NORVASC ) 5 MG tablet Take 1 tablet (5 mg total) by mouth daily.   aspirin  EC 81 MG tablet Take 1 tablet (81 mg total) by mouth daily. Swallow whole.    b complex vitamins capsule Take 1 capsule by mouth daily.   cholecalciferol  (VITAMIN D ) 400 units TABS tablet Take 400 Units by mouth.   co-enzyme Q-10 30 MG capsule Take 30 mg by mouth 3 (three) times daily.   famotidine (PEPCID) 20 MG tablet Take 20 mg by mouth as needed.   hydrochlorothiazide  (HYDRODIURIL ) 25 MG tablet Take 1 tablet (25 mg total) by mouth daily.   rosuvastatin  (CRESTOR ) 10 MG tablet Take 1 tablet (10 mg total) by mouth daily.   vitamin B-12 (CYANOCOBALAMIN ) 1000 MCG tablet Take 1,000 mcg by mouth daily.   vitamin E 45 MG (100 UNITS) capsule as needed.     Allergies:   Patient has no known allergies.   Social History   Socioeconomic History   Marital status: Single    Spouse name: Not on file   Number of children: 2   Years of education: Not on file   Highest education level: Bachelor's degree (e.g., BA, AB, BS)  Occupational History   Not on file  Tobacco Use   Smoking status: Never   Smokeless tobacco: Never  Vaping Use   Vaping status: Never Used  Substance and Sexual Activity   Alcohol use: No   Drug use: No   Sexual activity: Not on file  Other Topics Concern   Not on file  Social History Narrative   Not on file   Social Drivers of Health   Financial Resource Strain: Low Risk  (11/25/2022)   Overall Financial Resource Strain (CARDIA)    Difficulty of Paying Living Expenses: Not very hard  Food Insecurity: No Food Insecurity (11/24/2022)   Hunger Vital Sign    Worried About Running Out of Food in the Last Year: Never true    Ran Out of Food in the Last Year: Never true  Transportation Needs: No Transportation Needs (11/24/2022)   PRAPARE - Administrator, Civil Service (Medical): No    Lack of Transportation (Non-Medical): No  Physical Activity: Sufficiently Active (11/24/2022)   Exercise Vital Sign    Days of Exercise per Week: 5 days    Minutes of Exercise per Session: 30 min  Stress: No Stress Concern Present (11/24/2022)   [employer] - Occupational Stress Questionnaire    Feeling of Stress : Not at all  Social Connections: Moderately Isolated (11/25/2022)   Social Connection and Isolation Panel [NHANES]    Frequency of Communication with Friends and Family: Three times a week    Frequency of Social Gatherings with Friends and Family: Three times a week    Attends Religious Services: Never    Active Member of Clubs or Organizations: Yes    Attends Engineer, Structural: More than 4 times per year    Marital Status: Divorced     Family History: The patient's family history includes Emphysema in her  father; Hypertension in her brother; Kidney disease in her brother.  ROS:   Please see the history of present illness.     All other systems reviewed and are negative.  EKGs/Labs/Other Studies Reviewed:    The following studies were reviewed today:  Echo 05/2020  1. Left ventricular ejection fraction, by estimation, is 60 to 65%. The  left ventricle has normal function. The left ventricle has no regional  wall motion abnormalities. There is mild concentric left ventricular  hypertrophy with more notable, discrete  thickening of the basal-septal segment. Left ventricular diastolic  parameters are consistent with Grade I diastolic dysfunction (impaired  relaxation).   2. Right ventricular systolic function is normal. The right ventricular  size is mildly enlarged. There is normal pulmonary artery systolic  pressure. The estimated right ventricular systolic pressure is 26.6 mmHg.   3. The mitral valve is grossly normal. Trivial mitral valve  regurgitation.   4. The aortic valve is tricuspid. Aortic valve regurgitation is not  visualized. No aortic stenosis is present.   5. The inferior vena cava is normal in size with greater than 50%  respiratory variability, suggesting right atrial pressure of 3 mmHg.   Comparison(s): No prior Echocardiogram.    Conclusion(s)/Recommendation(s): No intracardiac source of embolism  detected on this transthoracic study. A transesophageal echocardiogram is  recommended to exclude cardiac source of embolism if clinically indicated.  Cardiac PET CT 03/22/23:  Narrative & Impression      LV perfusion is normal. There is no evidence of ischemia. There is no evidence of infarction.   Rest left ventricular function is normal. Rest EF: 73%. Stress left ventricular function is normal. Stress EF: 75%. End diastolic cavity size is normal.   Myocardial blood flow was computed to be 1.75ml/g/min at rest and 3.42ml/g/min at stress. Global myocardial blood flow reserve was 2.71 and was normal.   Coronary calcium  was absent on the attenuation correction CT images.   The study is normal. The study is low risk.   Electronically signed by Darryle Decent, MD    EKG:  EKG is not ordered today.    Recent Labs: 11/25/2022: ALT 25; Hemoglobin 13.3; Platelets 216.0 01/26/2023: BUN 16; Creatinine, Ser 0.69; Potassium 3.5; Sodium 141  Recent Lipid Panel    Component Value Date/Time   CHOL 193 11/25/2022 1435   TRIG 144.0 11/25/2022 1435   HDL 64.60 11/25/2022 1435   CHOLHDL 3 11/25/2022 1435   VLDL 28.8 11/25/2022 1435   LDLCALC 99 11/25/2022 1435    Risk Assessment/Calculations:           Physical Exam:    VS:  BP 132/70   Pulse 98   Ht 5' 2 (1.575 m)   Wt 142 lb (64.4 kg)   SpO2 100%   BMI 25.97 kg/m     Wt Readings from Last 3 Encounters:  03/29/23 142 lb (64.4 kg)  03/03/23 141 lb (64 kg)  01/04/23 137 lb (62.1 kg)     GEN:  Well nourished, well developed in no acute distress HEENT: Normal NECK: No JVD; No carotid bruits LYMPHATICS: No lymphadenopathy CARDIAC: RRR, no murmurs, rubs, gallops RESPIRATORY:  Clear to auscultation without rales, wheezing or rhonchi  ABDOMEN: Soft, non-tender, non-distended MUSCULOSKELETAL:  No edema; No deformity  SKIN: Warm and dry NEUROLOGIC:  Alert and  oriented x 3 PSYCHIATRIC:  Normal affect   ASSESSMENT:    No diagnosis found.   PLAN:    In order of problems listed above:  Atypical chest pain: noncardiac.  Previous coronary CT obtained on 12/26/2019 showed clean coronary arteries.  Echo showed normal LV function. PET CT in October normal with calcium  score of 0.   Hypertension: Blood pressure is well controlled. Continue amlodipine  and HCT. Working on lifestyle modification.   Hyperlipidemia: intolerant of statins in the past. Now on Crestor  3x/week. LDL dropped from 160 to 99. I think this is adequate.  Last LDL 146 in August. Discussed importance of cholesterol lowering for secondary prevention of stroke.   History of CVA: history of vertebral artery dissection. Recommend she continue ASA 81 mg daily.       Follow up in one year   There are no Patient Instructions on file for this visit.   Signed, Sanaa Zilberman, MD  03/29/2023 11:04 AM     [facility]

## 2023-03-24 ENCOUNTER — Other Ambulatory Visit: Payer: Self-pay | Admitting: *Deleted

## 2023-03-24 MED ORDER — HYDROCHLOROTHIAZIDE 25 MG PO TABS: 25.0000 mg | ORAL_TABLET | Freq: Every day | ORAL | 1 refills | Status: DC

## 2023-03-24 MED ORDER — ROSUVASTATIN CALCIUM 10 MG PO TABS: 10.0000 mg | ORAL_TABLET | Freq: Every day | ORAL | 1 refills | Status: DC

## 2023-03-29 ENCOUNTER — Ambulatory Visit: Payer: Medicare Other | Attending: Cardiology | Admitting: Cardiology

## 2023-03-29 ENCOUNTER — Encounter: Payer: Self-pay | Admitting: Cardiology

## 2023-03-29 VITALS — BP 132/70 | HR 98 | Ht 62.0 in | Wt 142.0 lb

## 2023-03-29 DIAGNOSIS — I10 Essential (primary) hypertension: Secondary | ICD-10-CM

## 2023-03-29 DIAGNOSIS — E785 Hyperlipidemia, unspecified: Secondary | ICD-10-CM

## 2023-03-29 DIAGNOSIS — R0789 Other chest pain: Secondary | ICD-10-CM

## 2023-03-29 DIAGNOSIS — Z8673 Personal history of transient ischemic attack (TIA), and cerebral infarction without residual deficits: Secondary | ICD-10-CM | POA: Diagnosis not present

## 2023-03-29 MED ORDER — ROSUVASTATIN CALCIUM 10 MG PO TABS: 10.0000 mg | ORAL_TABLET | Freq: Every day | ORAL | 1 refills | Status: DC

## 2023-03-29 NOTE — Patient Instructions (Signed)
 Medication Instructions:  Continue current medications  *If you need a refill on your cardiac medications before your next appointment, please call your pharmacy*    Follow-Up: At [facility], you and your health needs are our priority.  As part of our continuing mission to provide you with exceptional heart care, we have created designated Provider Care Teams.  These Care Teams include your primary Cardiologist (physician) and Advanced Practice Providers (APPs -  Physician Assistants and Nurse Practitioners) who all work together to provide you with the care you need, when you need it.   Your next appointment:   12 month(s)  Provider:   Peter Jordan, MD

## 2023-04-01 ENCOUNTER — Ambulatory Visit: Payer: Medicare HMO | Admitting: Student

## 2023-04-06 ENCOUNTER — Ambulatory Visit: Payer: Medicare HMO | Admitting: Neurology

## 2023-05-04 ENCOUNTER — Ambulatory Visit: Payer: Medicare Other | Admitting: Diagnostic Neuroimaging

## 2023-08-15 ENCOUNTER — Ambulatory Visit: Payer: Medicare Other | Admitting: Diagnostic Neuroimaging

## 2023-08-15 ENCOUNTER — Encounter: Payer: Self-pay | Admitting: Diagnostic Neuroimaging

## 2023-08-15 VITALS — BP 143/83 | HR 87 | Ht 62.0 in | Wt 140.0 lb

## 2023-08-15 DIAGNOSIS — Z8673 Personal history of transient ischemic attack (TIA), and cerebral infarction without residual deficits: Secondary | ICD-10-CM | POA: Diagnosis not present

## 2023-08-15 DIAGNOSIS — L608 Other nail disorders: Secondary | ICD-10-CM | POA: Diagnosis not present

## 2023-08-15 DIAGNOSIS — L821 Other seborrheic keratosis: Secondary | ICD-10-CM | POA: Diagnosis not present

## 2023-08-15 DIAGNOSIS — L304 Erythema intertrigo: Secondary | ICD-10-CM | POA: Diagnosis not present

## 2023-08-15 NOTE — Progress Notes (Signed)
 [facility]  PATIENT: Jeanette Barnes DOB: [birth year]  REFERRING CLINICIAN: Zilphia Hilt, [place]* HISTORY FROM: patient  REASON FOR VISIT: new consult   HISTORICAL  CHIEF COMPLAINT:  Chief Complaint  Patient presents with   Cerebrovascular Accident    Rm 7 alone  Pt is well, reports her Last stroke was March 2022. She has residual L sided weakness, imbalance and headaches about twice a week.     HISTORY OF PRESENT ILLNESS:   68 year old female with history of alpha 1 antitrypsin deficiency, hypertension, hypercholesterolemia, stroke here for follow-up.  March 2022 patient had onset of gait difficulty, vertigo and dizziness, was found to have multiple embolic ischemic infarctions affecting the posterior circulation including the cerebellum, pons and right occipital regions.  She was found to have left vertebral artery occlusion and right vertebral artery stenosis.  Stroke workup was completed.  She was treated with dual antiplatelets for 3 months and then aspirin  alone.  In addition mild fibromuscular dysplasia of bilateral cervical carotid arteries was noted with some mild pseudo aneurysms which have been stable.  Since that time patient is stable.  She lives on her own.  She still has some mild gait difficulty.   REVIEW OF SYSTEMS: Full 14 system review of systems performed and negative with exception of: as per HPI.  ALLERGIES: No Known Allergies  HOME MEDICATIONS: Outpatient Medications Prior to Visit  Medication Sig Dispense Refill   ALPRAZolam  (XANAX ) 0.5 MG tablet Take 0.5 tablets (0.25 mg total) by mouth as needed for anxiety. 20 tablet 0   amLODipine  (NORVASC ) 5 MG tablet Take 1 tablet (5 mg total) by mouth daily. 180 tablet 3   aspirin  EC 81 MG tablet Take 1 tablet (81 mg total) by mouth daily. Swallow whole. 90 tablet 3   b complex vitamins capsule Take 1 capsule by mouth daily.     cholecalciferol  (VITAMIN D ) 400 units TABS tablet Take 400  Units by mouth.     co-enzyme Q-10 30 MG capsule Take 30 mg by mouth 3 (three) times daily.     famotidine (PEPCID) 20 MG tablet Take 20 mg by mouth as needed.     hydrochlorothiazide  (HYDRODIURIL ) 25 MG tablet Take 1 tablet (25 mg total) by mouth daily. 90 tablet 1   rosuvastatin  (CRESTOR ) 10 MG tablet Take 1 tablet (10 mg total) by mouth daily. Taking 3 times a week 90 tablet 1   vitamin B-12 (CYANOCOBALAMIN ) 1000 MCG tablet Take 1,000 mcg by mouth daily.     vitamin E 45 MG (100 UNITS) capsule as needed.     albuterol  (VENTOLIN  HFA) 108 (90 Base) MCG/ACT inhaler Inhale 2 puffs into the lungs every 6 (six) hours as needed for wheezing or shortness of breath. 18 g 6   Fluticasone-Umeclidin-Vilant (TRELEGY ELLIPTA ) 200-62.5-25 MCG/ACT AEPB Inhale 1 puff into the lungs daily. 60 each 11   Fluticasone-Umeclidin-Vilant (TRELEGY ELLIPTA ) 200-62.5-25 MCG/ACT AEPB Inhale 1 puff into the lungs daily.     No facility-administered medications prior to visit.    PAST MEDICAL HISTORY: Past Medical History:  Diagnosis Date   AAT (alpha-1-antitrypsin) deficiency (HCC)    Anxiety    Heart murmur    mitral valve prolapse   Hypercholesteremia    Hypertension    Pulmonary nodule    Vitamin D  deficiency     PAST SURGICAL HISTORY: Past Surgical History:  Procedure Laterality Date   ABDOMINAL HYSTERECTOMY     BREAST BIOPSY     BREAST EXCISIONAL BIOPSY Bilateral  1990's   2 Left, 1 Right, all benign   IR ANGIO EXTERNAL CAROTID SEL EXT CAROTID BILAT MOD SED  05/21/2020   IR ANGIO VERTEBRAL SEL SUBCLAVIAN INNOMINATE UNI L MOD SED  05/21/2020   IR ANGIO VERTEBRAL SEL VERTEBRAL UNI R MOD SED  05/21/2020   IR US  GUIDE VASC ACCESS RIGHT  05/21/2020    FAMILY HISTORY: Family History  Problem Relation Age of Onset   Emphysema Father    Hypertension Brother    Kidney disease Brother     SOCIAL HISTORY: Social History   Socioeconomic History   Marital status: Single    Spouse name: Not on file   Number  of children: 2   Years of education: Not on file   Highest education level: Bachelor's degree (e.g., BA, AB, BS)  Occupational History   Not on file  Tobacco Use   Smoking status: Never   Smokeless tobacco: Never  Vaping Use   Vaping status: Never Used  Substance and Sexual Activity   Alcohol use: No   Drug use: No   Sexual activity: Not on file  Other Topics Concern   Not on file  Social History Narrative   Not on file   Social Drivers of Health   Financial Resource Strain: Low Risk  (11/25/2022)   Overall Financial Resource Strain (CARDIA)    Difficulty of Paying Living Expenses: Not very hard  Food Insecurity: No Food Insecurity (11/24/2022)   Hunger Vital Sign    Worried About Running Out of Food in the Last Year: Never true    Ran Out of Food in the Last Year: Never true  Transportation Needs: No Transportation Needs (11/24/2022)   PRAPARE - Administrator, Civil Service (Medical): No    Lack of Transportation (Non-Medical): No  Physical Activity: Sufficiently Active (11/24/2022)   Exercise Vital Sign    Days of Exercise per Week: 5 days    Minutes of Exercise per Session: 30 min  Stress: No Stress Concern Present (11/24/2022)   [employer] of Occupational Health - Occupational Stress Questionnaire    Feeling of Stress : Not at all  Social Connections: Moderately Isolated (11/25/2022)   Social Connection and Isolation Panel [NHANES]    Frequency of Communication with Friends and Family: Three times a week    Frequency of Social Gatherings with Friends and Family: Three times a week    Attends Religious Services: Never    Active Member of Clubs or Organizations: Yes    Attends Banker Meetings: More than 4 times per year    Marital Status: Divorced  Intimate Partner Violence: Not At Risk (11/24/2022)   Humiliation, Afraid, Rape, and Kick questionnaire    Fear of Current or Ex-Partner: No    Emotionally Abused: No    Physically Abused: No     Sexually Abused: No     PHYSICAL EXAM  GENERAL EXAM/CONSTITUTIONAL: Vitals:  Vitals:   08/15/23 1114  BP: (!) 143/83  Pulse: 87  Weight: 140 lb (63.5 kg)  Height: 5\' 2"  (1.575 m)   Body mass index is 25.61 kg/m. Wt Readings from Last 3 Encounters:  08/15/23 140 lb (63.5 kg)  03/29/23 142 lb (64.4 kg)  03/03/23 141 lb (64 kg)   Patient is in no distress; well developed, nourished and groomed; neck is supple  CARDIOVASCULAR: Examination of carotid arteries is normal; no carotid bruits Regular rate and rhythm, no murmurs Examination of peripheral vascular system by observation and  palpation is normal  EYES: Ophthalmoscopic exam of optic discs and posterior segments is normal; no papilledema or hemorrhages No results found.  MUSCULOSKELETAL: Gait, strength, tone, movements noted in Neurologic exam below  NEUROLOGIC: MENTAL STATUS:      No data to display         awake, alert, oriented to person, place and time recent and remote memory intact normal attention and concentration language fluent, comprehension intact, naming intact fund of knowledge appropriate  CRANIAL NERVE:  2nd - no papilledema on fundoscopic exam 2nd, 3rd, 4th, 6th - pupils equal and reactive to light, visual fields full to confrontation, extraocular muscles intact, no nystagmus 5th - facial sensation symmetric 7th - facial strength symmetric 8th - hearing intact 9th - palate elevates symmetrically, uvula midline 11th - shoulder shrug symmetric 12th - tongue protrusion midline  MOTOR:  normal bulk and tone, full strength in the BUE, BLE  SENSORY:  normal and symmetric to light touch, temperature, vibration  COORDINATION:  finger-nose-finger, fine finger movements normal  REFLEXES:  deep tendon reflexes 1+ and symmetric  GAIT/STATION:  narrow based gait; SLIGHT DIFF WITH TANDEM GAIT     DIAGNOSTIC DATA (LABS, IMAGING, TESTING) - I reviewed patient records, labs, notes,  testing and imaging myself where available.  Lab Results  Component Value Date   WBC 6.7 11/25/2022   HGB 13.3 11/25/2022   HCT 40.4 11/25/2022   MCV 91.5 11/25/2022   PLT 216.0 11/25/2022      Component Value Date/Time   NA 141 01/26/2023 1046   K 3.5 01/26/2023 1046   CL 101 01/26/2023 1046   CO2 26 01/26/2023 1046   GLUCOSE 101 (H) 01/26/2023 1046   GLUCOSE 85 11/25/2022 1435   BUN 16 01/26/2023 1046   CREATININE 0.69 01/26/2023 1046   CALCIUM  9.4 01/26/2023 1046   PROT 7.2 11/25/2022 1435   ALBUMIN 4.1 11/25/2022 1435   AST 26 11/25/2022 1435   ALT 25 11/25/2022 1435   ALKPHOS 77 11/25/2022 1435   BILITOT 0.3 11/25/2022 1435   GFRNONAA >60 05/23/2020 0313   GFRAA >60 04/14/2017 1013   Lab Results  Component Value Date   CHOL 193 11/25/2022   HDL 64.60 11/25/2022   LDLCALC 99 11/25/2022   TRIG 144.0 11/25/2022   CHOLHDL 3 11/25/2022   Lab Results  Component Value Date   HGBA1C 5.5 05/19/2020   No results found for: "VITAMINB12" Lab Results  Component Value Date   TSH 0.647 05/19/2020    05/18/20 MRI HEAD IMPRESSION: [I reviewed images myself and agree with interpretation. -VRP]  1. Patchy acute ischemic nonhemorrhagic infarcts involving the left cerebellum, cerebellar vermis, right temporoccipital region, and left midbrain/pons. 2. Otherwise normal brain MRI for age.   05/18/20 MRA HEAD IMPRESSION:  1. Occlusion of the left vertebral artery with concern for acute dissection as below. Attenuated irregular flow within the left V4 segment distally may in part be retrograde in nature. Dominant right vertebral artery widely patent. 2. Poor visualization of the left SCA, which also may be partially occluded given the distribution of infarcts. 3. Widely patent anterior circulation.   05/18/20 MRA NECK IMPRESSION:  1. Partial occlusion of the left vertebral artery at its origin, with severely attenuated and irregular flow distally within the left V2 and V3  segments. Given the presence of acute posterior circulation infarcts, these findings raise the possibility for an acute arterial dissection. Correlation with dedicated CTA could be performed for further evaluation as warranted. 2. Short-segment moderate  approximate 50% stenosis involving the pre foraminal right V1 segment. Otherwise wide patency of the dominant right vertebral artery. 3. Mild for age atheromatous change about the carotid bifurcations without significant stenosis. Otherwise wide patency of both carotid artery systems in the neck.  01/22/21 CTA head / neck 1. Cervical carotid FMD with 4 x 2 mm left pseudoaneurysm which is non progressed. 2. 60% atheromatous narrowing at the right V1 segment. 3. Chronic left proximal vertebral occlusion with downstream reconstitution.  02/07/23 Non-contrast head CT: 1.  No evidence of an acute intracranial abnormality. 2. Known chronic cerebellar infarcts.   02/07/23 CTA neck:  1. Beaded irregularity of the mid and distal cervical internal carotid arteries consistent with fibromuscular dysplasia. Superimposed 4 x 2 mm pseudoaneurysm arising from the mid cervical left ICA, unchanged. 2. Moderate stenosis within the right vertebral artery V1 segment, unchanged. 3. Chronic proximal left vertebral artery occlusion with downstream reconstitution   02/07/23 CTA head:  No intracranial large vessel occlusion or high-grade proximal arterial stenosis.    ASSESSMENT AND PLAN  68 y.o. year old female here with:   Dx:  1. History of stroke     PLAN:  HISTORY OF STROKE (multiple posterior circulation infarcts in 2022, likely related to left vertebral artery occlusion) - continue aspirin  81mg , statin (rosuvastatin  5mg  twice a week), BP control - optimize exercise, nutrition and sleep   Return for return to PCP, pending if symptoms worsen or fail to improve.    Omega Bible, MD 08/15/2023, 12:29 PM Certified in Neurology,  Neurophysiology and Neuroimaging  [facility] [address], [address] [place], [place] [address] [phone]

## 2023-08-15 NOTE — Patient Instructions (Signed)
  HISTORY OF STROKE (multiple posterior circulation infarcts in 2022, likely related to left vertebral artery occlusion) - continue aspirin  81mg , statin (rosuvastatin  5mg  twice a week), BP control - optimize exercise, nutrition and sleep

## 2023-09-09 ENCOUNTER — Other Ambulatory Visit (HOSPITAL_BASED_OUTPATIENT_CLINIC_OR_DEPARTMENT_OTHER): Payer: Self-pay | Admitting: Internal Medicine

## 2023-09-09 DIAGNOSIS — Z1231 Encounter for screening mammogram for malignant neoplasm of breast: Secondary | ICD-10-CM

## 2023-09-10 ENCOUNTER — Encounter (HOSPITAL_COMMUNITY): Payer: Self-pay | Admitting: Interventional Radiology

## 2023-09-27 ENCOUNTER — Encounter (HOSPITAL_BASED_OUTPATIENT_CLINIC_OR_DEPARTMENT_OTHER): Admitting: Radiology

## 2023-10-11 ENCOUNTER — Encounter (HOSPITAL_BASED_OUTPATIENT_CLINIC_OR_DEPARTMENT_OTHER): Admitting: Radiology

## 2023-10-18 ENCOUNTER — Ambulatory Visit (HOSPITAL_BASED_OUTPATIENT_CLINIC_OR_DEPARTMENT_OTHER)
Admission: RE | Admit: 2023-10-18 | Discharge: 2023-10-18 | Disposition: A | Discharge status: Home / Self Care | Payer: Self-pay | Source: Ambulatory Visit | Attending: Internal Medicine | Admitting: Internal Medicine

## 2023-10-18 ENCOUNTER — Encounter (HOSPITAL_BASED_OUTPATIENT_CLINIC_OR_DEPARTMENT_OTHER): Payer: Self-pay | Admitting: Radiology

## 2023-10-18 DIAGNOSIS — Z1231 Encounter for screening mammogram for malignant neoplasm of breast: Secondary | ICD-10-CM | POA: Diagnosis not present

## 2023-11-09 ENCOUNTER — Other Ambulatory Visit: Payer: Self-pay | Admitting: Internal Medicine

## 2023-11-29 ENCOUNTER — Encounter: Payer: Self-pay | Admitting: Internal Medicine

## 2023-11-29 ENCOUNTER — Ambulatory Visit (INDEPENDENT_AMBULATORY_CARE_PROVIDER_SITE_OTHER): Payer: Medicare HMO | Admitting: Internal Medicine

## 2023-11-29 VITALS — BP 130/80 | HR 75 | Temp 98.3°F | Ht 62.75 in | Wt 132.3 lb

## 2023-11-29 DIAGNOSIS — Z780 Asymptomatic menopausal state: Secondary | ICD-10-CM

## 2023-11-29 DIAGNOSIS — Z1211 Encounter for screening for malignant neoplasm of colon: Secondary | ICD-10-CM | POA: Diagnosis not present

## 2023-11-29 DIAGNOSIS — E785 Hyperlipidemia, unspecified: Secondary | ICD-10-CM

## 2023-11-29 DIAGNOSIS — I63112 Cerebral infarction due to embolism of left vertebral artery: Secondary | ICD-10-CM

## 2023-11-29 DIAGNOSIS — Z13820 Encounter for screening for osteoporosis: Secondary | ICD-10-CM

## 2023-11-29 DIAGNOSIS — Z8673 Personal history of transient ischemic attack (TIA), and cerebral infarction without residual deficits: Secondary | ICD-10-CM | POA: Diagnosis not present

## 2023-11-29 DIAGNOSIS — I10 Essential (primary) hypertension: Secondary | ICD-10-CM

## 2023-11-29 DIAGNOSIS — Z0000 Encounter for general adult medical examination without abnormal findings: Secondary | ICD-10-CM

## 2023-11-29 LAB — CBC WITH DIFFERENTIAL/PLATELET
Basophils Absolute: 0 K/uL (ref 0.0–0.1)
Basophils Relative: 0.5 % (ref 0.0–3.0)
Eosinophils Absolute: 0.1 K/uL (ref 0.0–0.7)
Eosinophils Relative: 1.9 % (ref 0.0–5.0)
HCT: 38.6 % (ref 36.0–46.0)
Hemoglobin: 12.8 g/dL (ref 12.0–15.0)
Lymphocytes Relative: 44.7 % (ref 12.0–46.0)
Lymphs Abs: 1.7 K/uL (ref 0.7–4.0)
MCHC: 33.1 g/dL (ref 30.0–36.0)
MCV: 90 fl (ref 78.0–100.0)
Monocytes Absolute: 0.4 K/uL (ref 0.1–1.0)
Monocytes Relative: 11.2 % (ref 3.0–12.0)
Neutro Abs: 1.6 K/uL (ref 1.4–7.7)
Neutrophils Relative %: 41.7 % — ABNORMAL LOW (ref 43.0–77.0)
Platelets: 198 K/uL (ref 150.0–400.0)
RBC: 4.28 Mil/uL (ref 3.87–5.11)
RDW: 14 % (ref 11.5–15.5)
WBC: 3.7 K/uL — ABNORMAL LOW (ref 4.0–10.5)

## 2023-11-29 LAB — LIPID PANEL
Cholesterol: 155 mg/dL (ref 0–200)
HDL: 52.9 mg/dL (ref >39.00)
LDL Cholesterol: 78 mg/dL (ref 0–99)
NonHDL: 101.92
Total CHOL/HDL Ratio: 3
Triglycerides: 122 mg/dL (ref 0.0–149.0)
VLDL: 24.4 mg/dL (ref 0.0–40.0)

## 2023-11-29 LAB — VITAMIN B12: Vitamin B-12: 1174 pg/mL — ABNORMAL HIGH (ref 211–911)

## 2023-11-29 LAB — VITAMIN D 25 HYDROXY (VIT D DEFICIENCY, FRACTURES): VITD: 37.31 ng/mL (ref 30.00–100.00)

## 2023-11-29 LAB — TSH: TSH: 2.22 u[IU]/mL (ref 0.35–5.50)

## 2023-11-29 NOTE — Progress Notes (Signed)
 Established Patient Office Visit     CC/Reason for Visit: Annual preventive exam and subsequent Medicare wellness visit  HPI: Jeanette Barnes is a 68 y.o. female who is coming in today for the above mentioned reasons. Past Medical History is significant for: Hypertension, hyperlipidemia, history of CVA, GERD, generalized anxiety disorder.  Is feeling well without major concerns or complaints.  Has routine eye and dental care.  Is due for flu, COVID, Tdap and shingles vaccines.  She believes she has had a pneumonia vaccine in [place]  and will try and obtain records.  She had a normal Cologuard in 2022 and is due for an update, is also due for an updated bone density, had a mammogram last month.   Past Medical/Surgical History: Past Medical History:  Diagnosis Date   AAT (alpha-1-antitrypsin) deficiency (HCC)    Anxiety    Heart murmur    mitral valve prolapse   Hypercholesteremia    Hypertension    Pulmonary nodule    Vitamin D  deficiency     Past Surgical History:  Procedure Laterality Date   ABDOMINAL HYSTERECTOMY     BREAST BIOPSY     BREAST EXCISIONAL BIOPSY Bilateral 1990's   2 Left, 1 Right, all benign   IR ANGIO EXTERNAL CAROTID SEL EXT CAROTID BILAT MOD SED  05/21/2020   IR ANGIO VERTEBRAL SEL SUBCLAVIAN INNOMINATE UNI L MOD SED  05/21/2020   IR ANGIO VERTEBRAL SEL VERTEBRAL UNI R MOD SED  05/21/2020   IR US  GUIDE VASC ACCESS RIGHT  05/21/2020    Social History:  reports that she has never smoked. She has never used smokeless tobacco. She reports that she does not drink alcohol and does not use drugs.  Allergies: No Known Allergies  Family History:  Family History  Problem Relation Age of Onset   Emphysema Father    Hypertension Brother    Kidney disease Brother      Current Outpatient Medications:    ALPRAZolam  (XANAX ) 0.5 MG tablet, Take 0.5 tablets (0.25 mg total) by mouth as needed for anxiety., Disp: 20 tablet, Rfl: 0   amLODipine  (NORVASC ) 5 MG tablet,  Take 1 tablet (5 mg total) by mouth daily., Disp: 180 tablet, Rfl: 3   aspirin  EC 81 MG tablet, Take 1 tablet (81 mg total) by mouth daily. Swallow whole., Disp: 90 tablet, Rfl: 3   b complex vitamins capsule, Take 1 capsule by mouth daily., Disp: , Rfl:    cholecalciferol  (VITAMIN D ) 400 units TABS tablet, Take 400 Units by mouth., Disp: , Rfl:    co-enzyme Q-10 30 MG capsule, Take 30 mg by mouth 3 (three) times daily., Disp: , Rfl:    famotidine (PEPCID) 20 MG tablet, Take 20 mg by mouth as needed., Disp: , Rfl:    hydrochlorothiazide  (HYDRODIURIL ) 25 MG tablet, TAKE 1 TABLET BY MOUTH DAILY, Disp: 90 tablet, Rfl: 0   rosuvastatin  (CRESTOR ) 10 MG tablet, Take 1 tablet (10 mg total) by mouth daily. Taking 3 times a week, Disp: 90 tablet, Rfl: 1   vitamin B-12 (CYANOCOBALAMIN ) 1000 MCG tablet, Take 1,000 mcg by mouth daily., Disp: , Rfl:    vitamin E 45 MG (100 UNITS) capsule, as needed., Disp: , Rfl:   Review of Systems:  Negative unless indicated in HPI.   Physical Exam: Vitals:   11/29/23 1003  BP: 130/80  Pulse: 75  Temp: 98.3 F (36.8 C)  TempSrc: Oral  SpO2: 98%  Weight: 132 lb 4.8 oz (60 kg)  Height: 5' 2.75 (1.594 m)    Body mass index is 23.62 kg/m.   Physical Exam Vitals reviewed.  Constitutional:      General: She is not in acute distress.    Appearance: Normal appearance. She is not ill-appearing, toxic-appearing or diaphoretic.  HENT:     Head: Normocephalic.     Right Ear: Tympanic membrane, ear canal and external ear normal. There is no impacted cerumen.     Left Ear: Tympanic membrane, ear canal and external ear normal. There is no impacted cerumen.     Nose: Nose normal.     Mouth/Throat:     Mouth: Mucous membranes are moist.     Pharynx: Oropharynx is clear. No oropharyngeal exudate or posterior oropharyngeal erythema.  Eyes:     General: No scleral icterus.       Right eye: No discharge.        Left eye: No discharge.     Conjunctiva/sclera:  Conjunctivae normal.     Pupils: Pupils are equal, round, and reactive to light.  Neck:     Vascular: No carotid bruit.  Cardiovascular:     Rate and Rhythm: Normal rate and regular rhythm.     Pulses: Normal pulses.     Heart sounds: Normal heart sounds.  Pulmonary:     Effort: Pulmonary effort is normal. No respiratory distress.     Breath sounds: Normal breath sounds.  Abdominal:     General: Abdomen is flat. Bowel sounds are normal.     Palpations: Abdomen is soft.  Musculoskeletal:        General: Normal range of motion.     Cervical back: Normal range of motion.  Skin:    General: Skin is warm and dry.  Neurological:     General: No focal deficit present.     Mental Status: She is alert and oriented to person, place, and time. Mental status is at baseline.  Psychiatric:        Mood and Affect: Mood normal.        Behavior: Behavior normal.        Thought Content: Thought content normal.        Judgment: Judgment normal.    Subsequent Medicare wellness visit   1. Risk factors, based on past  M,S,F - Cardiac Risk Factors include: advanced age (>22men, >68 women);dyslipidemia   2.  Physical activities: Dietary issues and exercise activities discussed:      3.  Depression/mood:  Flowsheet Row Office Visit from 11/25/2022 in [facility] Total Score 0     4.  ADL's:    11/28/2023   12:01 PM  In your present state of health, do you have any difficulty performing the following activities:  Hearing? 0  Vision? 0  Difficulty concentrating or making decisions? 1  Walking or climbing stairs? 1  Dressing or bathing? 0  Doing errands, shopping? 0  Preparing Food and eating ? N  Using the Toilet? N  In the past six months, have you accidently leaked urine? N  Do you have problems with loss of bowel control? N  Managing your Medications? N  Managing your Finances? N  Housekeeping or managing your Housekeeping? N     5.  Fall  risk:     10/07/2022    4:08 PM 11/24/2022    4:35 PM 11/25/2022    3:44 PM 11/28/2023   12:01 PM 11/29/2023    9:56 AM  Fall Risk  Falls in the past year? 0 0 0 1 1  Was there an injury with Fall? 0 0 0 1 1  Fall Risk Category Calculator 0 0 0 3  3  Fall risk Follow up Falls evaluation completed Falls evaluation completed Falls evaluation completed  Falls evaluation completed     Patient-reported     6.  Home safety: No problems identified   7.  Height weight, and visual acuity: height and weight as above, vision/hearing: Vision Screening   Right eye Left eye Both eyes  Without correction 20/30 20/30 20/30   With correction        8.  Counseling: Counseling given: Not Answered    9. Lab orders based on risk factors: Laboratory update will be reviewed   10. Cognitive assessment:        11/29/2023    9:58 AM 11/24/2022    4:36 PM  6CIT Screen  What Year? 0 points 0 points  What month? 0 points 0 points  What time? 0 points 0 points  Count back from 20 0 points 0 points  Months in reverse 0 points 0 points  Repeat phrase 0 points 0 points  Total Score 0 points 0 points     11. Screening: Patient provided with a written and personalized 5-10 year screening schedule in the AVS. Health Maintenance  Topic Date Due   Colon Cancer Screening  Never done   Zoster (Shingles) Vaccine (1 of 2) Never done   DTaP/Tdap/Td vaccine (2 - Td or Tdap) 09/13/2019   COVID-19 Vaccine (4 - 2025-26 season) 11/14/2023   Pneumococcal Vaccine for age over 5 (2 of 2 - PCV20 or PCV21) 12/01/2023*   Flu Shot  06/12/2024*   Medicare Annual Wellness Visit  11/28/2024   Breast Cancer Screening  10/17/2025   DEXA scan (bone density measurement)  Completed   Hepatitis C Screening  Completed   HPV Vaccine  Aged Out   Meningitis B Vaccine  Aged Out  *Topic was postponed. The date shown is not the original due date.    12. Provider List Update: Patient Care Team    Relationship Specialty  Notifications Start End  Theophilus Andrews, Tully GRADE, MD PCP - General Internal Medicine  10/07/22   [place], Peter M, MD PCP - Cardiology Cardiology  07/07/21      13. Advance Directives: Does Patient Have a Medical Advance Directive?: No Would patient like information on creating a medical advance directive?: No - Patient declined  14. Opioids: Patient is not on any opioid prescriptions and has no risk factors for a substance use disorder.   15.   Goals      Become More Active     Timeframe:  Long-Range Goal Priority:  Medium Start Date:                             Expected End Date:                       Follow Up Date 11/24/2023    - go out for a short walk before breakfast, after dinner or both    Why is this important?   It is easy to come up with reasons not to exercise.  These steps will help you get started and have fun doing it.    Notes:      Protect My Health  Why is this important?   Screening tests can find diseases early when they are easier to treat.  Your doctor or nurse will talk with you about which tests are important for you.  Getting shots for common diseases like the flu and shingles will help prevent them.     Notes:          I have personally reviewed and noted the following in the patient's chart:   Medical and social history Use of alcohol, tobacco or illicit drugs  Current medications and supplements Functional ability and status Nutritional status Physical activity Advanced directives List of other physicians Hospitalizations, surgeries, and ER visits in previous 12 months Vitals Screenings to include cognitive, depression, and falls Referrals and appointments  In addition, I have reviewed and discussed with patient certain preventive protocols, quality metrics, and best practice recommendations. A written personalized care plan for preventive services as well as general preventive health recommendations were provided to  patient.   Impression and Plan:  Medicare annual wellness visit, subsequent  Cerebrovascular accident (CVA) due to embolism of left vertebral artery (HCC)  Essential hypertension -     CBC with Differential/Platelet; Future -     Comprehensive metabolic panel with GFR; Future -     TSH; Future -     Vitamin B12; Future -     VITAMIN D  25 Hydroxy (Vit-D Deficiency, Fractures); Future  Dyslipidemia -     Lipid panel; Future  Postmenopausal estrogen deficiency  Screening for malignant neoplasm of colon -     Cologuard  Screening for osteoporosis -     DG Bone Density; Future  -Recommend routine eye and dental care. -Healthy lifestyle discussed in detail. -Labs to be updated today. -Prostate cancer screening: Not applicable Health Maintenance  Topic Date Due   Colon Cancer Screening  Never done   Zoster (Shingles) Vaccine (1 of 2) Never done   DTaP/Tdap/Td vaccine (2 - Td or Tdap) 09/13/2019   COVID-19 Vaccine (4 - 2025-26 season) 11/14/2023   Pneumococcal Vaccine for age over 28 (2 of 2 - PCV20 or PCV21) 12/01/2023*   Flu Shot  06/12/2024*   Medicare Annual Wellness Visit  11/28/2024   Breast Cancer Screening  10/17/2025   DEXA scan (bone density measurement)  Completed   Hepatitis C Screening  Completed   HPV Vaccine  Aged Out   Meningitis B Vaccine  Aged Out  *Topic was postponed. The date shown is not the original due date.     -Declines vaccinations today despite counseling. - Update bone density and colon cancer screening.    Tully Theophilus Andrews, MD [facility]

## 2023-11-29 NOTE — Progress Notes (Signed)
 Medicare Wellness question were answered 11/24/23 and 11/29/23

## 2023-12-01 ENCOUNTER — Inpatient Hospital Stay: Admission: RE | Admit: 2023-12-01 | Source: Ambulatory Visit

## 2023-12-03 LAB — COMPREHENSIVE METABOLIC PANEL WITH GFR
ALT: 16 U/L (ref 0–35)
AST: 21 U/L (ref 0–37)
Albumin: 4.3 g/dL (ref 3.5–5.2)
Alkaline Phosphatase: 57 U/L (ref 39–117)
BUN: 9 mg/dL (ref 6–23)
CO2: 27 meq/L (ref 19–32)
Calcium: 9.4 mg/dL (ref 8.4–10.5)
Chloride: 108 meq/L (ref 96–112)
Creatinine, Ser: 0.68 mg/dL (ref 0.40–1.20)
GFR: 88.1 mL/min (ref >60.00)
Glucose, Bld: 93 mg/dL (ref 70–99)
Potassium: 3.8 meq/L (ref 3.5–5.1)
Sodium: 137 meq/L (ref 135–145)
Total Bilirubin: 0.4 mg/dL (ref 0.2–1.2)
Total Protein: 6.9 g/dL (ref 6.0–8.3)

## 2023-12-06 ENCOUNTER — Ambulatory Visit: Payer: Self-pay | Admitting: Internal Medicine

## 2023-12-09 ENCOUNTER — Telehealth: Payer: Self-pay

## 2023-12-09 NOTE — Telephone Encounter (Signed)
 Copied from CRM [id number]. Topic: Clinical - Prescription Issue >> Dec 09, 2023 10:43 AM Wess RAMAN wrote: Reason for CRM: Patient needs rosuvastatin  (CRESTOR ) 5 MG tablet sent to pharmacy. She currently has rosuvastatin  (CRESTOR ) 10 MG tablet listed on her chart but stated she was advised by Dr. Theophilus Andrews to continue taking the 5MG  until after her next lab visit.  Callback #: [id number]  Pharmacy: [facility] - [place] - [address] [place] [address] Phone: [phone] Fax: [phone] Hours: Not open 24 hours

## 2023-12-12 MED ORDER — ROSUVASTATIN CALCIUM 5 MG PO TABS: 5.0000 mg | ORAL_TABLET | Freq: Every day | ORAL | 1 refills | Status: DC

## 2023-12-12 NOTE — Telephone Encounter (Signed)
 Rx done.

## 2023-12-12 NOTE — Telephone Encounter (Signed)
Refill was faxed

## 2023-12-12 NOTE — Addendum Note (Signed)
 Addended by: METTA KRISTEN CROME on: 12/12/2023 09:55 AM   Modules accepted: Orders

## 2023-12-12 NOTE — Addendum Note (Signed)
 Addended by: KATHRYNE MILLMAN B on: 12/12/2023 09:49 AM   Modules accepted: Orders

## 2023-12-12 NOTE — Telephone Encounter (Signed)
Refill will be sent

## 2023-12-13 ENCOUNTER — Inpatient Hospital Stay: Admission: RE | Admit: 2023-12-13 | Source: Ambulatory Visit

## 2023-12-16 ENCOUNTER — Encounter: Payer: Self-pay | Admitting: Pulmonary Disease

## 2023-12-16 ENCOUNTER — Ambulatory Visit (INDEPENDENT_AMBULATORY_CARE_PROVIDER_SITE_OTHER): Admitting: Pulmonary Disease

## 2023-12-16 VITALS — BP 160/70 | HR 72 | Temp 98.5°F | Ht 62.0 in | Wt 132.0 lb

## 2023-12-16 DIAGNOSIS — E8801 Alpha-1-antitrypsin deficiency: Secondary | ICD-10-CM | POA: Diagnosis not present

## 2023-12-16 DIAGNOSIS — R0600 Dyspnea, unspecified: Secondary | ICD-10-CM | POA: Diagnosis not present

## 2023-12-16 NOTE — Progress Notes (Signed)
 Jeanette Barnes    [id number]    [birth year]  Primary Care Physician:Hernandez Delma Tully GRADE, MD  Referring Physician: Theophilus Delma, Tully GRADE, MD [address] [place],  [place] [address]  Chief complaint: Follow-up for alpha-1 antitrypsin deficiency.   HPI: Jeanette Barnes is a 68 year old with history of fibromuscular dysplasia, alpha-1 antitrypsin deficiency. This was diagnosed in 2014 at [facility] by Dr. Loreli, Pulmonary with SS phenotype. She is also being followed for subcentimeter pulmonary nodules. Since she is a never smoker just told she does not need a follow-up CT scan. Her father had emphysema but is also heavy smoker. She's had an evaluation at GI in [place]  with a normal ultrasound of the liver  In office today she complains of mild dyspnea with activity, nonproductive cough. She has atypical chest pain and has been worked up with a cardiac stress test last year which was normal.   Interim history: Discussed the use of AI scribe software for clinical note transcription with the patient, who gave verbal consent to proceed.  The patient, with a history of fibromuscular dysplasia (FMD) and alpha 1 antidepressant deficiency, presents with increased shortness of breath, particularly when climbing stairs or doing housework. She reports that this is a change from her baseline. She was previously started on Trelegy inhaler, but she reports that it causes coughing and she has not noticed any improvement in her breathing. She does not use it daily. 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 She is scheduled for a PET scan at the end of the month due to chronic chest pain and concerns about her vessels.   Relevant pulmonary history Pets: None Occupation: Secondary school teacher for [employer] Exposures: No known exposures at work or at home Smoking history: No smoking history  Outpatient Encounter  Medications as of 12/16/2023  Medication Sig   ALPRAZolam  (XANAX ) 0.5 MG tablet Take 0.5 tablets (0.25 mg total) by mouth as needed for anxiety.   aspirin  EC 81 MG tablet Take 1 tablet (81 mg total) by mouth daily. Swallow whole.   b complex vitamins capsule Take 1 capsule by mouth daily.   cholecalciferol  (VITAMIN D ) 400 units TABS tablet Take 400 Units by mouth.   co-enzyme Q-10 30 MG capsule Take 30 mg by mouth 3 (three) times daily.   famotidine (PEPCID) 20 MG tablet Take 20 mg by mouth as needed.   hydrochlorothiazide  (HYDRODIURIL ) 25 MG tablet TAKE 1 TABLET BY MOUTH DAILY   rosuvastatin  (CRESTOR ) 5 MG tablet Take 1 tablet (5 mg total) by mouth daily.   vitamin B-12 (CYANOCOBALAMIN ) 1000 MCG tablet Take 1,000 mcg by mouth daily.   vitamin E 45 MG (100 UNITS) capsule as needed.   amLODipine  (NORVASC ) 5 MG tablet Take 1 tablet (5 mg total) by mouth daily.   No facility-administered encounter medications on file as of 12/16/2023.   Physical Exam: Blood pressure 127/71, pulse 81, temperature (!) 97.3 F (36.3 C), temperature source Oral, height 5' 3.5 (1.613 m), weight 141 lb (64 kg), SpO2 100%. Gen:      No acute distress HEENT:  EOMI, sclera anicteric Neck:     No masses; no thyromegaly Lungs:    Clear to auscultation bilaterally; normal respiratory effort CV:         Regular rate and rhythm; no murmurs Abd:      + bowel sounds; soft, non-tender; no palpable masses, no  distension Ext:    No edema; adequate peripheral perfusion Skin:      Warm and dry; no rash Neuro: alert and oriented x 3 Psych: normal mood and affect   Data Reviewed: Imaging: HRCT 7/1//15-No Emphysema or ILD. Small Fibrotic Streaks. 3 Small Nodules (Left Apex 3 Mm, Posterior Lingular 3 Mm, Right Lower Lobe 5 Mm. HRCT 11/19/14-Subpleural Groundglass Pulmonary Nodule in the Posterior Lingula [3 Mm] and a at Left Lung Base Adjacent to Hemidiaphragm [5 Mm], Medial Left Lower Lobe Scar, Calcified Left Apical Nodule  CT  scan 08/16/16 4 mm subpleural nodule in the left lower lobe. Mild subpleural nodular thickening in the right middle lobe. Chest x-ray 04/14/17-clear lungs.  No acute abnormality I have reviewed the images personally.  CT abdomen pelvis 07/26/2019 ([facility]) The first image on series 3 lung windows reveals a potential medial right lower lobe opacity however this could be a partially imaged normal structure. Lung bases are otherwise clear. Heart size is normal.    CT chest 12/26/2019-no emphysema, stable 4 mm nodule in the periphery of the left lower lobe.  Chest x-ray 03/03/2023-no active cardiopulmonary disease I have reviewed the images personally.  PFTs:  07/05/12- no significant obstructive disease, lung volumes within normal limits, DLCO mildly reduced. FEV 2.91 [104% predicted], ratio of 76.  05/24/17 FVC 2.52 [101%], FEV1 1.93 [99%], F/F 76, TLC 89%, DLCO 67%, DLCO/VA 86%  02/05/2020 FVC 2.43 [9 9%), FEV1 1.85 [97%], F/F 76, TLC 4.35 [87%], DLCOcorr 15.35 [81%] No obstruction, lung volumes and diffusion capacity are normal  03/03/2023 FVC 2.42 [81%], FEV1 1.85 [80%], F/F76, TLC 4.67 [93%], DLCO 13.60 [70%] Mild diffusion defect  Labs: Data from outside [facility] Alpha-1 antitrypsin 09/08/12-SS Phenotype Alpha-1 antitrypsin levels 08/01/15-76. Alpha-1 antitrypsin levels 05/24/2017-88 Alpha-1 antitrypsin 12/06/2019-93, SS phenotype Alpha-1 antitrypsin levels 12/01/2022-94  Hepatic panel 11/29/2023-within normal limits  Assessment & Plan Alpha-1 antitrypsin deficiency, SS phenotype Pulmonary function tests are stable, and alpha-1 antitrypsin levels are within normal range. Symptoms are well-managed without inhalers or Trelegy.  Chest x-ray from December reviewed with no active cardiopulmonary disease.  Recent liver tests are normal - Schedule follow-up lung function test in 8 months - Check alpha-1 antitrypsin levels at next visit  Shortness of breath on exertion Experiences  dyspnea during exertion, such as cleaning, which improves with positional changes while lying down. No current use of inhalers.  Acute upper respiratory tract infection (viral cold) Symptoms for 12 days include cough and previous fever of 101.15F, resolved with Tylenol . Currently has a dry cough with some mucus and occasional wheezing. Lungs are clear on examination.   - Recommend over-the-counter expectorant or decongestant for symptom relief  Fibromuscular dysplasia with history of stroke Fibromuscular dysplasia with four strokes. Maintains low-dose statin regimen due to previous chest pain and elevated liver enzymes, despite FMD not being related to cholesterol buildup.  Hyperlipidemia Well-controlled on low-dose statin therapy (5 mg three times a week). Liver function tests have normalized after reducing statin dosage, and cholesterol levels are well-controlled.    Plan/Recommendations: PFTs and follow-up in 8 months  I personally spent a total of 38 minutes in the care of the patient today including preparing to see the patient, getting/reviewing separately obtained history, performing a medically appropriate exam/evaluation, placing orders, and documenting clinical information in the EHR.   Lonna Coder MD [facility] 12/16/2023, 10:36 AM  CC: Theophilus Andrews, [place]*

## 2023-12-16 NOTE — Patient Instructions (Signed)
  VISIT SUMMARY: Today, you came in for a follow-up on your alpha-1 antitrypsin deficiency and discussed your recent upper respiratory symptoms. We also reviewed your exertional shortness of breath, fibromuscular dysplasia, and hyperlipidemia.  YOUR PLAN: ALPHA-1 ANTITRYPSIN DEFICIENCY: Your pulmonary function tests are stable, and your alpha-1 antitrypsin levels are within the normal range. You are managing well without inhalers or Trelegy. -Schedule a follow-up lung function test in 8 months. -We will check your alpha-1 antitrypsin levels at your next visit.  SHORTNESS OF BREATH ON EXERTION: You experience shortness of breath during activities like cleaning, which improves when you change positions or lie down. You are not currently using any inhalers. -Continue monitoring your symptoms and let us  know if they worsen or if you need further assistance.  ACUTE UPPER RESPIRATORY TRACT INFECTION (VIRAL COLD): You have had cold symptoms for 12 days, including a previous fever that resolved with Tylenol . You currently have a dry cough with some mucus and occasional wheezing. -You can use an over-the-counter expectorant or decongestant to help relieve your symptoms.  FIBROMUSCULAR DYSPLASIA WITH HISTORY OF STROKE: You have a history of fibromuscular dysplasia and four strokes. You are on a low-dose statin regimen due to previous chest pain and elevated liver enzymes. -Continue your current low-dose statin therapy as prescribed.  HYPERLIPIDEMIA: Your cholesterol levels are well-controlled on a low-dose statin therapy, and your liver function tests have normalized after reducing the statin dosage. -Continue your current low-dose statin therapy (5 mg three times a week).                      Contains text generated by Abridge.                                 Contains text generated by Abridge.

## 2023-12-21 ENCOUNTER — Ambulatory Visit
Admission: RE | Admit: 2023-12-21 | Discharge: 2023-12-21 | Disposition: A | Source: Ambulatory Visit | Attending: Internal Medicine | Admitting: Internal Medicine

## 2023-12-21 DIAGNOSIS — M8589 Other specified disorders of bone density and structure, multiple sites: Secondary | ICD-10-CM | POA: Diagnosis not present

## 2023-12-21 DIAGNOSIS — Z13820 Encounter for screening for osteoporosis: Secondary | ICD-10-CM

## 2024-02-04 ENCOUNTER — Other Ambulatory Visit: Payer: Self-pay | Admitting: Internal Medicine

## 2024-02-18 LAB — COLOGUARD: COLOGUARD: NEGATIVE

## 2024-02-19 ENCOUNTER — Other Ambulatory Visit: Payer: Self-pay | Admitting: Internal Medicine

## 2024-03-02 ENCOUNTER — Encounter: Payer: Self-pay | Admitting: Internal Medicine

## 2024-03-05 ENCOUNTER — Encounter: Payer: Self-pay | Admitting: Internal Medicine

## 2024-03-05 DIAGNOSIS — F411 Generalized anxiety disorder: Secondary | ICD-10-CM

## 2024-03-05 MED ORDER — ALPRAZOLAM 0.5 MG PO TABS: 0.2500 mg | ORAL_TABLET | ORAL | 0 refills | Status: AC | PRN

## 2024-03-22 ENCOUNTER — Encounter: Payer: Self-pay | Admitting: Cardiology

## 2024-04-10 ENCOUNTER — Telehealth: Payer: Self-pay

## 2024-04-10 NOTE — Progress Notes (Signed)
" ° °  04/10/2024  Patient ID: Jeanette Barnes, female   DOB: 04/20/1955, 69 y.o.   MRN: [id number]  Pharmacy Quality Measure Review  This patient is appearing on a report for being at risk of failing the adherence measure for cholesterol (statin) medications this calendar year.   Medication: Rosuvastatin  Last fill date: 12/12/23 for 100 day supply  Spoke with patient. She reports she takes the 5mg  tablets twice a week and has been for quite some time. Reports this is the dose she was on when last labs were drawn. Is requesting a new rx to reflect how she is taking so that pharmacy will stop sending so many pills. Will coordinate with PCP.  Also advised patient that she is past due for cardiology visit, she will setup an appt. Counseled on how to monitor BP at home  Jon VEAR Lindau, PharmD Clinical Pharmacist [phone]   "

## 2024-04-11 ENCOUNTER — Telehealth: Payer: Self-pay | Admitting: *Deleted

## 2024-04-11 MED ORDER — ROSUVASTATIN CALCIUM 5 MG PO TABS: ORAL_TABLET | ORAL | 1 refills | Status: AC

## 2024-04-11 NOTE — Telephone Encounter (Signed)
 Refill sent

## 2024-04-11 NOTE — Telephone Encounter (Signed)
-----   Message from Tully Theophilus Andrews, MD sent at 04/11/2024  7:43 AM EST ----- Ok to send ----- Message ----- From: Kathryne Vernell NOVAK, CMA Sent: 04/10/2024   4:52 PM EST To: Tully CINDERELLA Theophilus Andrews, MD   ----- Message ----- From: Lionell Jon DEL, [facility] Sent: 04/10/2024   4:25 PM EST To: Theophilus Me  Hello,  Patient is requesting a new rx for the following:   Rosuvastatin  5mg  Take 1 tablet by mouth TWICE A WEEK.  Reports this is how she has been taking medication for a long time, including at last lipid panel check.  Would like directions updated so pharmacy will stop sending so many pills at one time.  Pharmacy Info: [facility] - [place], [place] - [address] P: [phone] F: [phone]  Thank you, Jon DEL Lionell, PharmD Clinical Pharmacist [phone]

## 2024-04-17 ENCOUNTER — Encounter: Payer: Self-pay | Admitting: Internal Medicine

## 2024-05-18 ENCOUNTER — Ambulatory Visit: Admitting: Cardiology
